# Patient Record
Sex: Female | Born: 1985
Health system: Southern US, Community
[De-identification: ages and names within clinical notes are randomized; demographics above are authoritative.]

## PROBLEM LIST (undated history)

## (undated) DIAGNOSIS — N390 Urinary tract infection, site not specified: Secondary | ICD-10-CM

## (undated) DIAGNOSIS — E079 Disorder of thyroid, unspecified: Secondary | ICD-10-CM

## (undated) DIAGNOSIS — F32A Depression, unspecified: Secondary | ICD-10-CM

## (undated) DIAGNOSIS — A6000 Herpesviral infection of urogenital system, unspecified: Secondary | ICD-10-CM

## (undated) HISTORY — DX: Urinary tract infection, site not specified: N39.0

## (undated) HISTORY — DX: Herpesviral infection of urogenital system, unspecified: A60.00

---

## 2007-05-13 ENCOUNTER — Observation Stay: Payer: Self-pay

## 2007-05-17 ENCOUNTER — Inpatient Hospital Stay: Payer: Self-pay | Admitting: Obstetrics & Gynecology

## 2007-10-01 LAB — HM HIV SCREENING LAB: HM HIV Screening: NEGATIVE

## 2008-12-14 ENCOUNTER — Emergency Department: Payer: Self-pay | Admitting: Internal Medicine

## 2012-05-15 LAB — HM PAP SMEAR: HM PAP: NORMAL

## 2012-06-24 ENCOUNTER — Emergency Department: Payer: Self-pay | Admitting: Emergency Medicine

## 2012-06-24 LAB — URINALYSIS, COMPLETE
Bilirubin,UR: NEGATIVE
Glucose,UR: NEGATIVE mg/dL (ref 0–75)
Nitrite: NEGATIVE
Ph: 6 (ref 4.5–8.0)
Protein: NEGATIVE
RBC,UR: 2 /HPF (ref 0–5)
Squamous Epithelial: 9
WBC UR: 10 /HPF (ref 0–5)

## 2012-06-24 LAB — COMPREHENSIVE METABOLIC PANEL
Albumin: 3.3 g/dL — ABNORMAL LOW (ref 3.4–5.0)
Anion Gap: 7 (ref 7–16)
Chloride: 105 mmol/L (ref 98–107)
Co2: 24 mmol/L (ref 21–32)
EGFR (Non-African Amer.): >60
Potassium: 3.5 mmol/L (ref 3.5–5.1)
SGOT(AST): 17 U/L (ref 15–37)
SGPT (ALT): 23 U/L (ref 12–78)
Sodium: 136 mmol/L (ref 136–145)
Total Protein: 7.1 g/dL (ref 6.4–8.2)

## 2012-06-24 LAB — CBC
MCH: 31.4 pg (ref 26.0–34.0)
MCV: 89 fL (ref 80–100)
RBC: 3.98 10*6/uL (ref 3.80–5.20)
WBC: 8.9 10*3/uL (ref 3.6–11.0)

## 2012-06-24 LAB — HCG, QUANTITATIVE, PREGNANCY: Beta Hcg, Quant.: 50304 m[IU]/mL — ABNORMAL HIGH

## 2012-09-15 ENCOUNTER — Emergency Department: Payer: Self-pay | Admitting: Emergency Medicine

## 2012-09-15 LAB — CBC
HCT: 33 % — ABNORMAL LOW (ref 35.0–47.0)
HGB: 11.7 g/dL — ABNORMAL LOW (ref 12.0–16.0)
MCH: 33 pg (ref 26.0–34.0)
MCV: 93 fL (ref 80–100)
Platelet: 226 10*3/uL (ref 150–440)
RBC: 3.54 10*6/uL — ABNORMAL LOW (ref 3.80–5.20)
RDW: 13.7 % (ref 11.5–14.5)

## 2012-09-15 LAB — COMPREHENSIVE METABOLIC PANEL
Albumin: 2.8 g/dL — ABNORMAL LOW (ref 3.4–5.0)
Anion Gap: 4 — ABNORMAL LOW (ref 7–16)
Bilirubin,Total: 0.2 mg/dL (ref 0.2–1.0)
Calcium, Total: 8.5 mg/dL (ref 8.5–10.1)
Creatinine: 0.4 mg/dL — ABNORMAL LOW (ref 0.60–1.30)
EGFR (Non-African Amer.): >60
Glucose: 92 mg/dL (ref 65–99)
SGOT(AST): 16 U/L (ref 15–37)
SGPT (ALT): 10 U/L — ABNORMAL LOW (ref 12–78)
Sodium: 138 mmol/L (ref 136–145)

## 2012-09-15 IMAGING — CT CT CHEST W/ CM
1 series · 15 of 31 positions shown, 19 images · IV contrast (APPLIED)
Comparison: none

REASON FOR EXAM: chest pain, sob, tachycardia, elevated d dimer. pt is
pregnant, discussed risks
COMMENTS:

[Series 4: soft tissue · axial · 0.62mm/px · z∈[-600,-435]mm · 15 of 61 slices shown, 19 images]
[im 3/61  mediastinal]
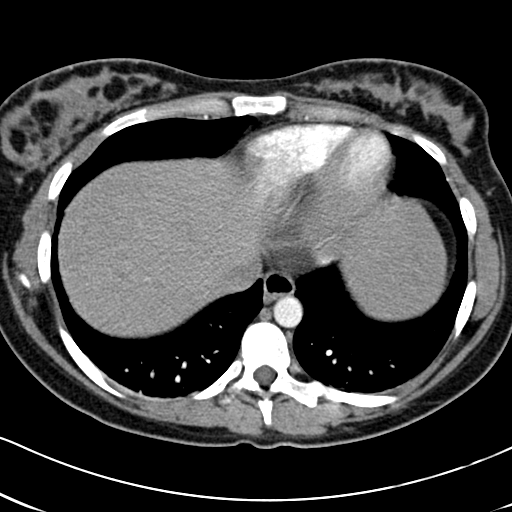
[im 3/61  lung]
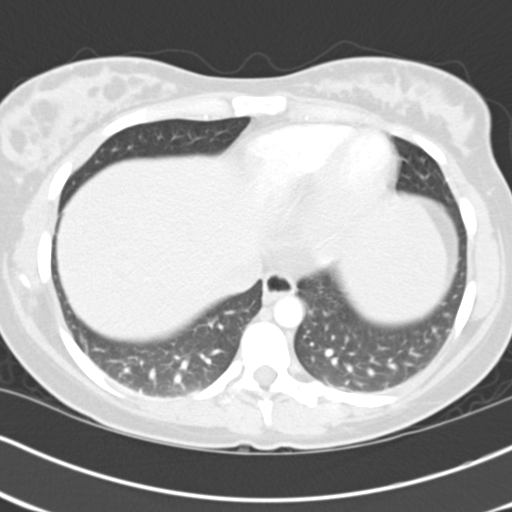
[im 7/61  lung]
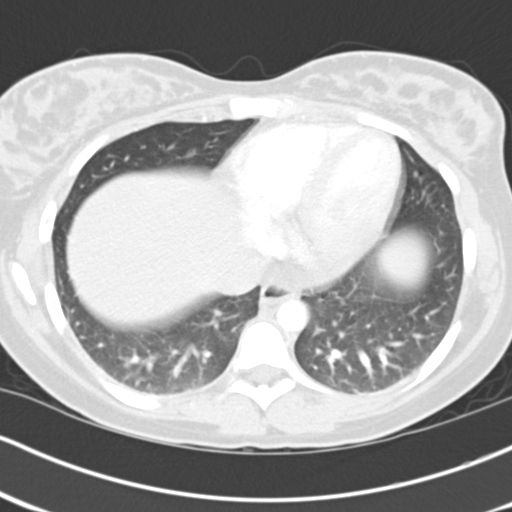
[im 12/61  lung]
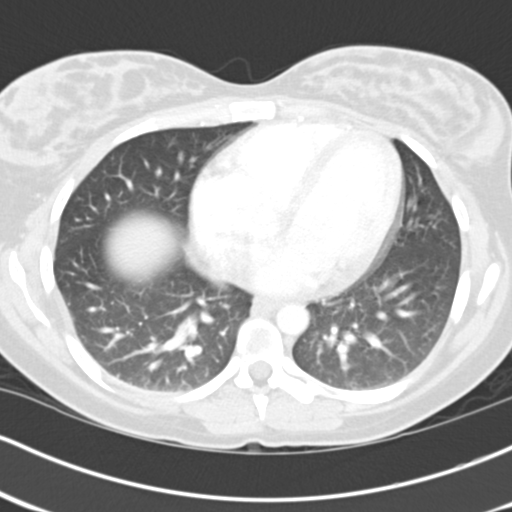
[im 14/61  lung]
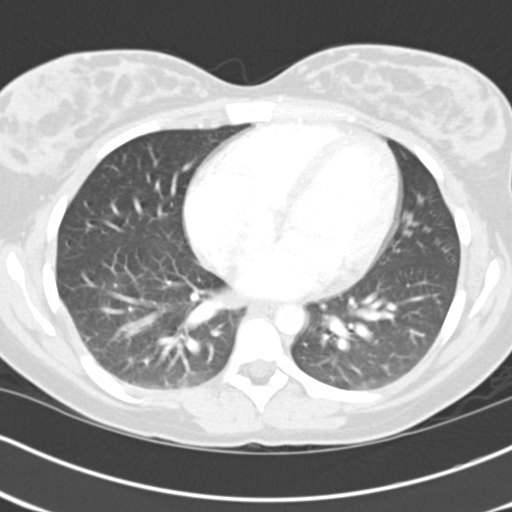
[im 18/61  mediastinal]
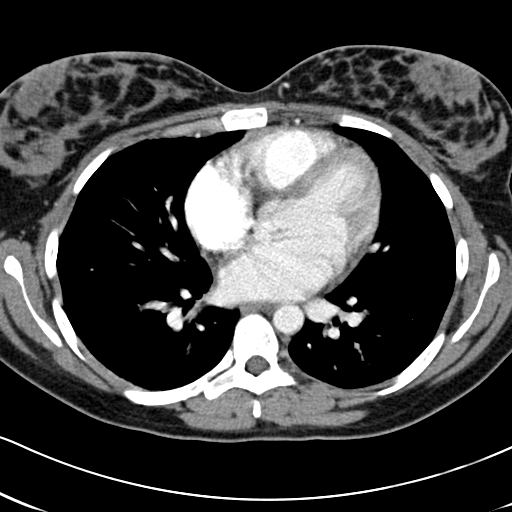
[im 18/61  lung]
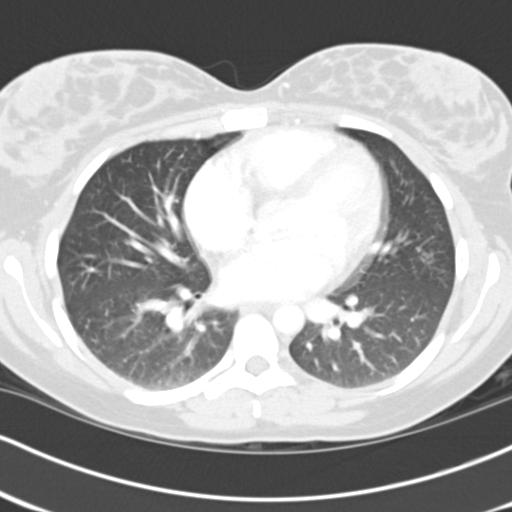
[im 23/61  lung]
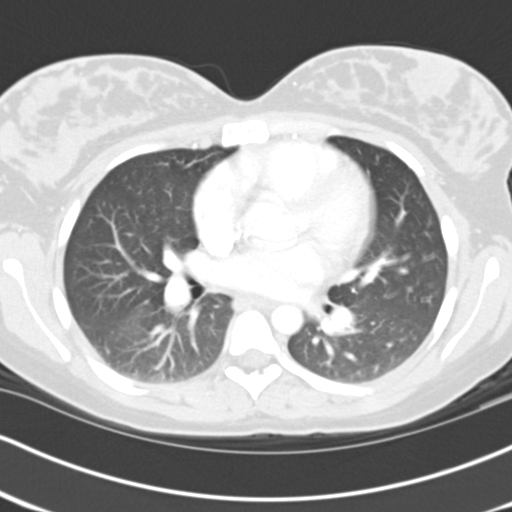
[im 27/61  lung]
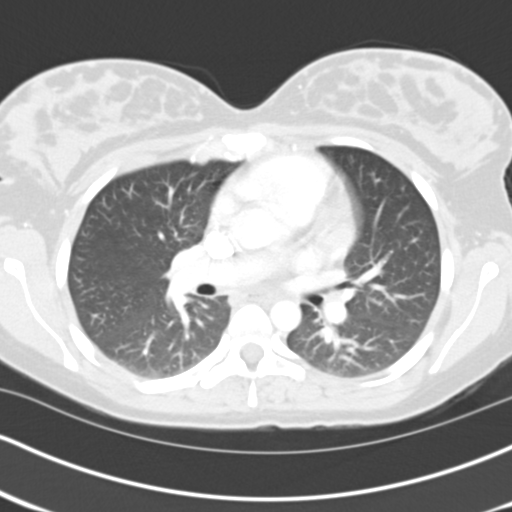
[im 29/61  lung]
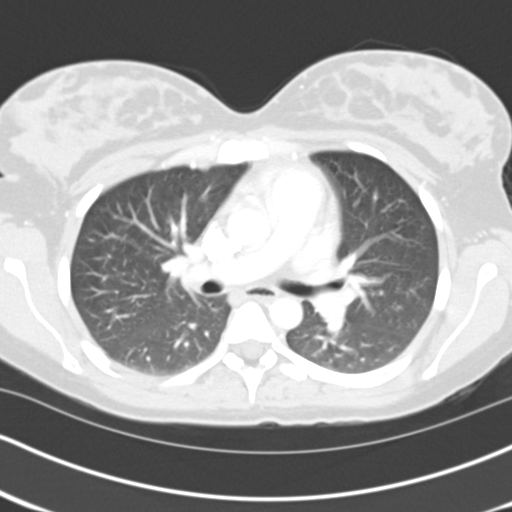
[im 34/61  mediastinal]
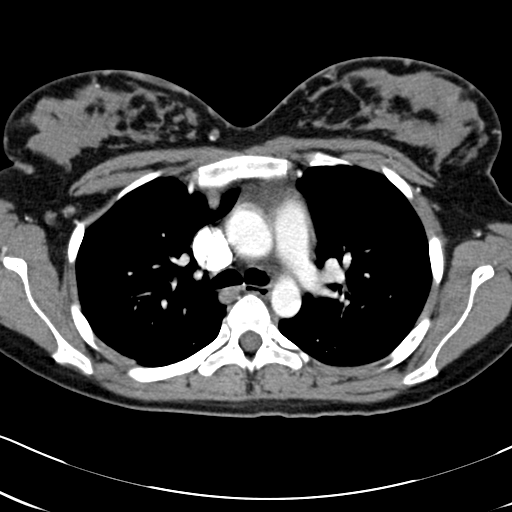
[im 34/61  lung]
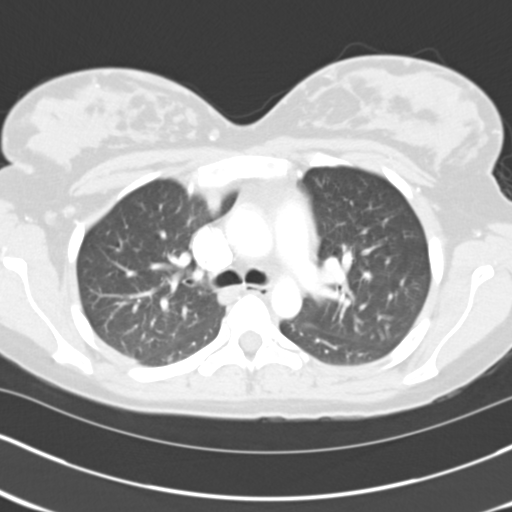
[im 37/61  lung]
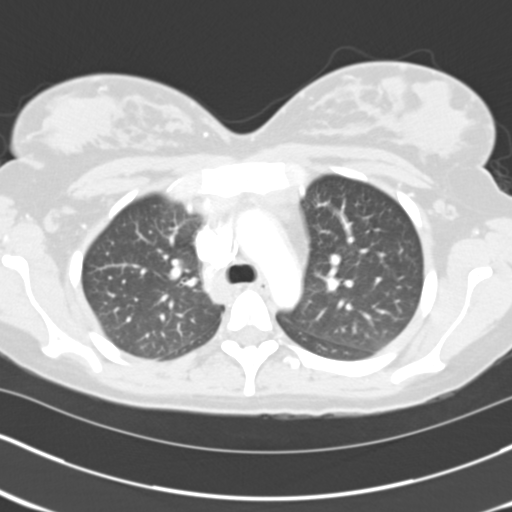
[im 41/61  lung]
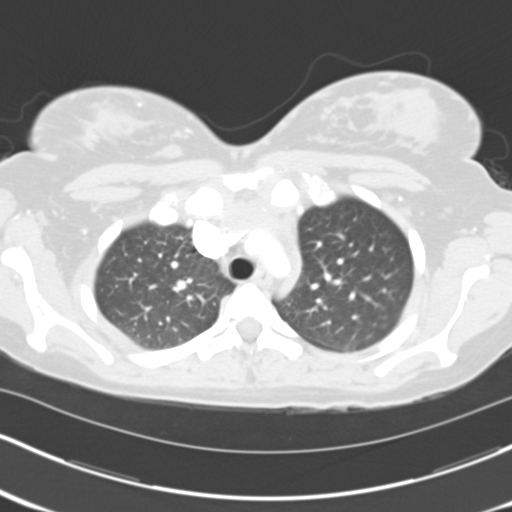
[im 45/61  lung]
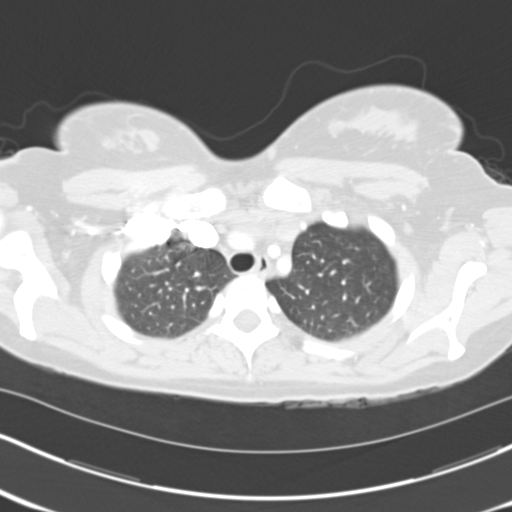
[im 49/61  mediastinal]
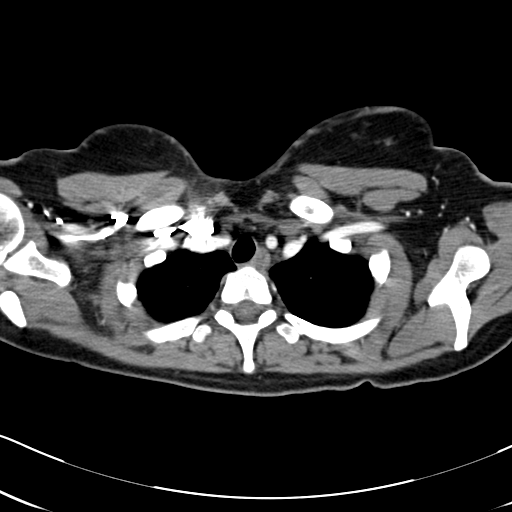
[im 49/61  lung]
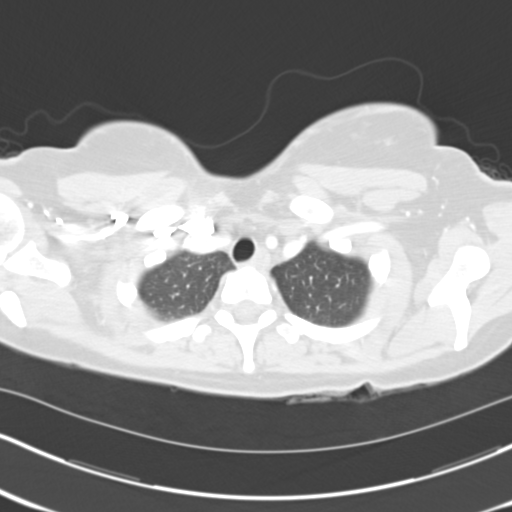
[im 54/61  lung]
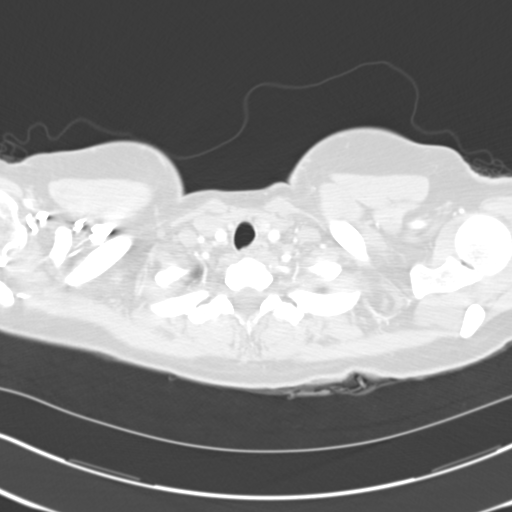
[im 58/61  lung]
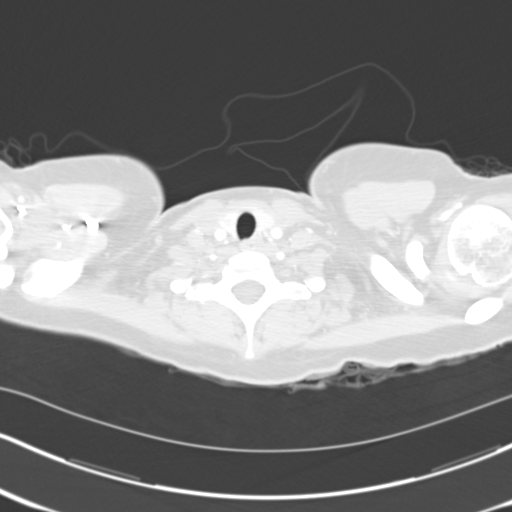

[15 of 31 positions shown; findings below may reference images not displayed]

PROCEDURE:     CT  - CT CHEST (FOR PE) W  - [DATE]  [DATE]

RESULT:     Emergent CT of the chest is performed with 100 mL of [4L]
iodinated intravenous contrast with images reconstructed in the axial plane
at 3 mm slice thickness. The patient has no previous exam for comparison.

There is a low-attenuation area in the mid right thyroid lobe measuring
cm with a Hounsfield reading of 54 which could represent a nonenhancing
nodule. The thyroid lobes otherwise appear unremarkable. The thoracic aorta
is normal in caliber without evidence of dissection. The pulmonary arteries
are well-opacified. Without evidence of a filling defect to suggest
pulmonary embolism. There is no focal consolidation in the included portions
of the lungs. There may be some minimal atelectasis in the right lower lobe.
There is some minimal motion artifact present. The included portions of the
liver and spleen appear unremarkable. There is no pleural or pericardial
effusion. No adenopathy is evident. The bony structures appear unremarkable.
IMPRESSION: 1. No pulmonary embolism. No thoracic aortic aneurysm or thoracic aortic
dissection.
2. Minimal area of nonenhancement in the right thyroid lobe which is
nonspecific. This could represent a nonenhancing solid or cystic lesion.
This measures approximately 6 mm.
3. No definite airspace disease, effusion, edema or pneumothorax.

[REDACTED]

## 2012-10-14 ENCOUNTER — Observation Stay: Payer: Self-pay | Admitting: Obstetrics and Gynecology

## 2012-12-09 ENCOUNTER — Observation Stay: Payer: Self-pay | Admitting: Obstetrics and Gynecology

## 2012-12-19 ENCOUNTER — Inpatient Hospital Stay: Payer: Self-pay | Admitting: Obstetrics and Gynecology

## 2012-12-19 LAB — CBC WITH DIFFERENTIAL/PLATELET
Basophil %: 0.4 %
Eosinophil #: 0.1 10*3/uL (ref 0.0–0.7)
Eosinophil %: 0.7 %
HCT: 37.7 % (ref 35.0–47.0)
MCH: 32.7 pg (ref 26.0–34.0)
MCHC: 34.8 g/dL (ref 32.0–36.0)
MCV: 94 fL (ref 80–100)
Monocyte %: 7.1 %
Neutrophil #: 9.6 10*3/uL — ABNORMAL HIGH (ref 1.4–6.5)
Neutrophil %: 69.7 %
RBC: 4.02 10*6/uL (ref 3.80–5.20)
WBC: 13.8 10*3/uL — ABNORMAL HIGH (ref 3.6–11.0)

## 2012-12-19 LAB — GC/CHLAMYDIA PROBE AMP

## 2013-09-30 ENCOUNTER — Emergency Department: Payer: Self-pay | Admitting: Emergency Medicine

## 2013-09-30 LAB — MONONUCLEOSIS SCREEN: Mono Test: NEGATIVE

## 2013-09-30 LAB — CBC WITH DIFFERENTIAL/PLATELET
Basophil #: 0 10*3/uL (ref 0.0–0.1)
Basophil %: 0.5 %
EOS PCT: 0.1 %
Eosinophil #: 0 10*3/uL (ref 0.0–0.7)
HCT: 40.5 % (ref 35.0–47.0)
HGB: 13.3 g/dL (ref 12.0–16.0)
LYMPHS PCT: 13 %
Lymphocyte #: 1.3 10*3/uL (ref 1.0–3.6)
MCH: 30.2 pg (ref 26.0–34.0)
MCHC: 32.7 g/dL (ref 32.0–36.0)
MCV: 92 fL (ref 80–100)
MONOS PCT: 12.8 %
Monocyte #: 1.3 x10 3/mm — ABNORMAL HIGH (ref 0.2–0.9)
NEUTROS PCT: 73.6 %
Neutrophil #: 7.5 10*3/uL — ABNORMAL HIGH (ref 1.4–6.5)
PLATELETS: 206 10*3/uL (ref 150–440)
RBC: 4.39 10*6/uL (ref 3.80–5.20)
RDW: 12.9 % (ref 11.5–14.5)
WBC: 10.2 10*3/uL (ref 3.6–11.0)

## 2013-10-02 LAB — BETA STREP CULTURE(ARMC)

## 2014-06-08 NOTE — H&P (Signed)
L&D Evaluation:  History:  HPI Pt is a 29 yo G2P1001 at 31.[redacted] weeks GA who presents to L&D today after twisting her ankle and falling. Pt does not remember if she fell on her abdomen. She is A+, VI, RI, and had her TDap on 10/07/12. Pt reports +FM, denies bleeding, LOF, contractions, or abdominal pain.   Presents with other, right ankle pain   Patient's Medical History No Chronic Illness   Patient's Surgical History none   Medications Pre Natal Vitamins   Allergies NKDA   Social History none   Family History Non-Contributory   ROS:  General normal   HEENT normal   CNS normal   GI normal   GU normal   Resp normal   CV normal   Renal normal   MS right ankle pain uopn movement   Exam:  Vital Signs stable   General no apparent distress   Mental Status clear   Chest clear   Heart normal sinus rhythm   Abdomen gravid, non-tender   Edema trace in the right foot   Skin dry, no lesions   Impression:  Impression other, possible sprained ankle   Plan:  Plan EFM/NST, monitor FHT, medication ordered for pain   Comments Ice packs applied to ankle Safe to have xray if condition worsens   Electronic Signatures: Elvin Banker, AnimatorCourtney (CNM)  (Signed 16-Sep-14 10:38)  Authored: L&D Evaluation   Last Updated: 16-Sep-14 10:38 by Jannet MantisSubudhi, Leightyn Cina (CNM)

## 2014-08-05 IMAGING — US US SOFT TISSUE HEAD/NECK
1 series · 14 of 25 positions shown · non-contrast
Comparison: None.

CLINICAL DATA: Goiter, right-sided enlargement on physical exam

EXAM:
THYROID ULTRASOUND
TECHNIQUE: Ultrasound examination of the thyroid gland and adjacent soft
tissues was performed.

[Series 1: us soft tissue head/neck · 0.07mm/px · 14 of 98 slices shown]
[im 1/98]
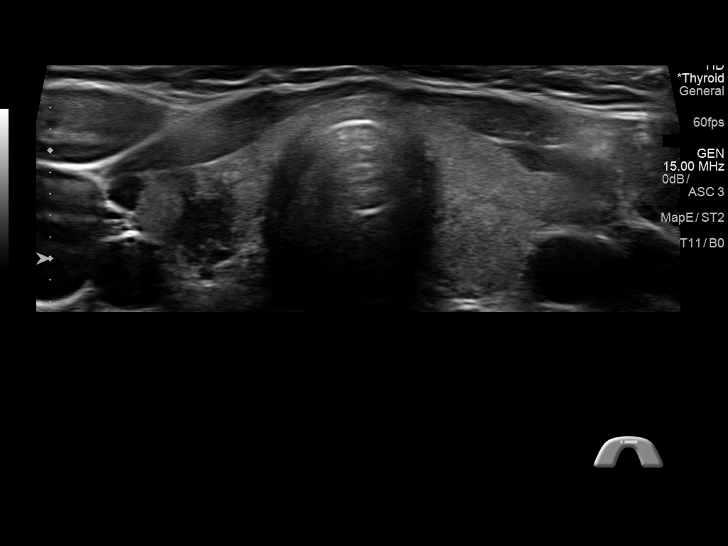
[im 9/98]
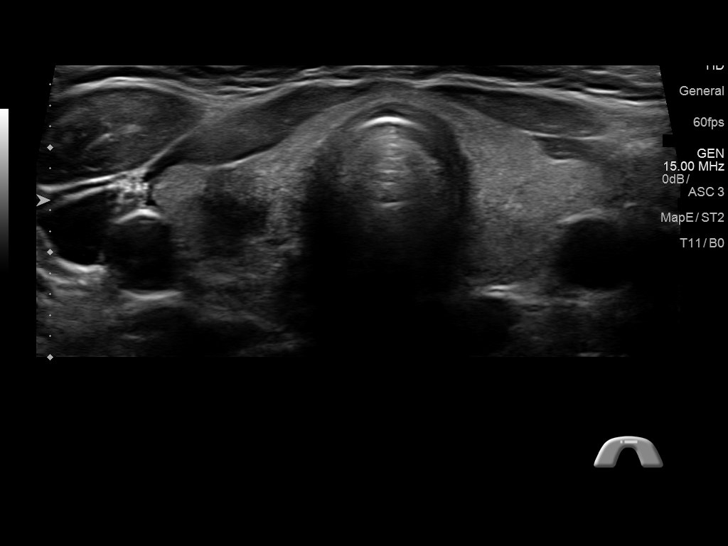
[im 17/98]
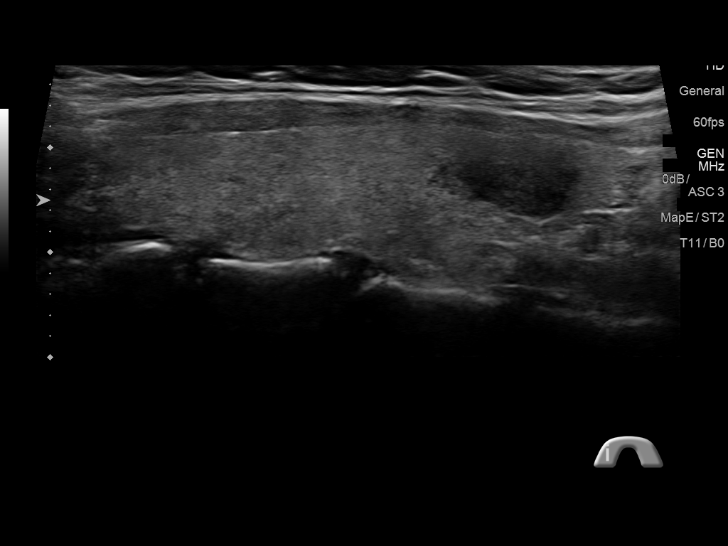
[im 25/98]
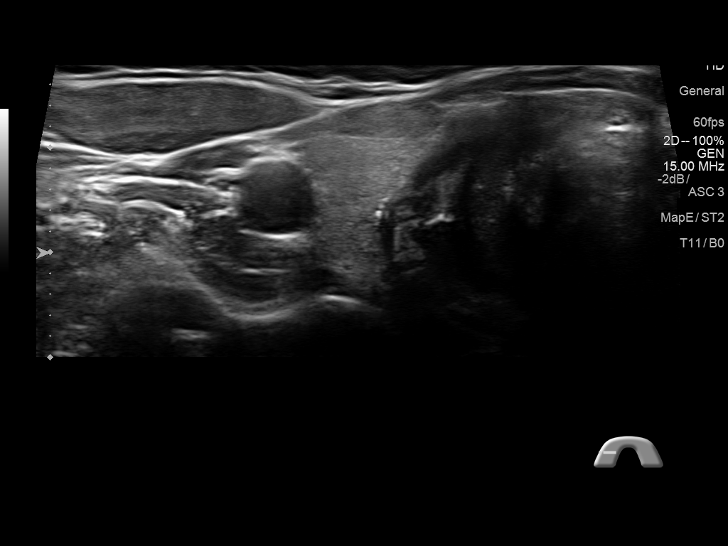
[im 33/98]
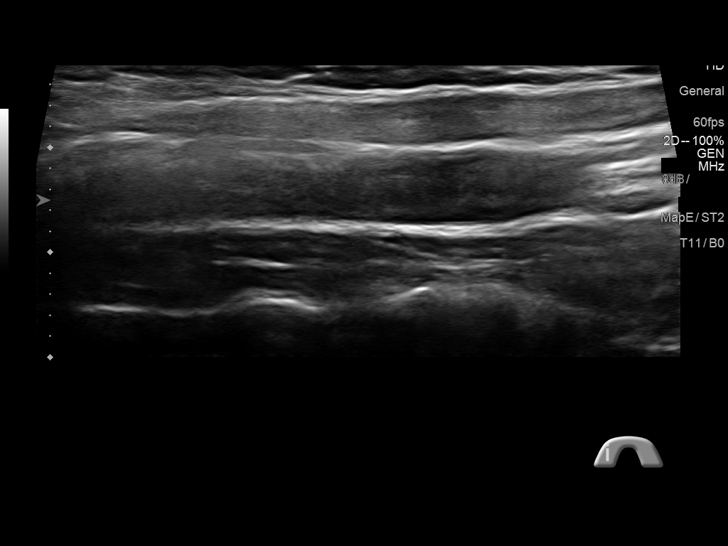
[im 37/98]
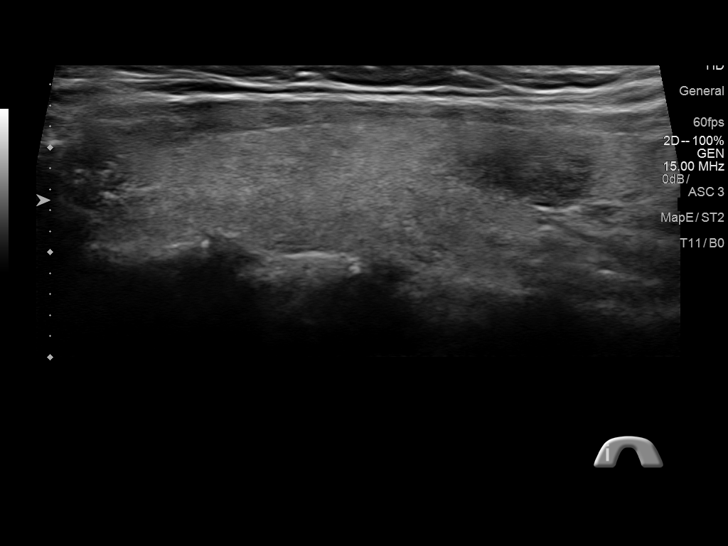
[im 45/98]
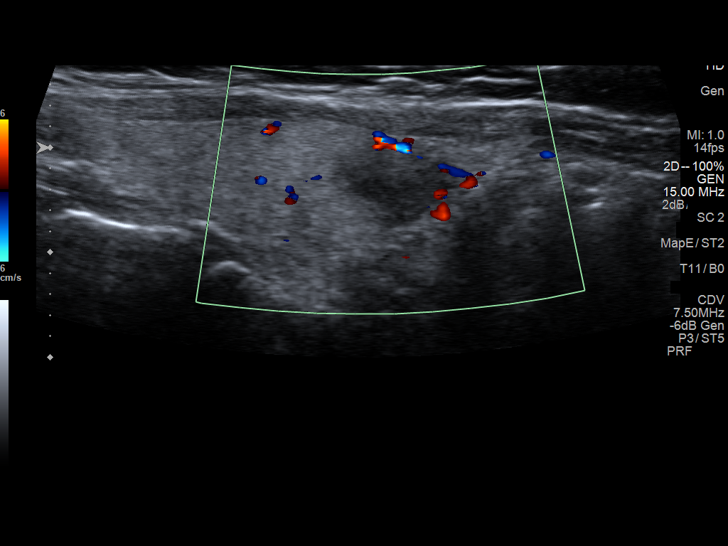
[im 53/98]
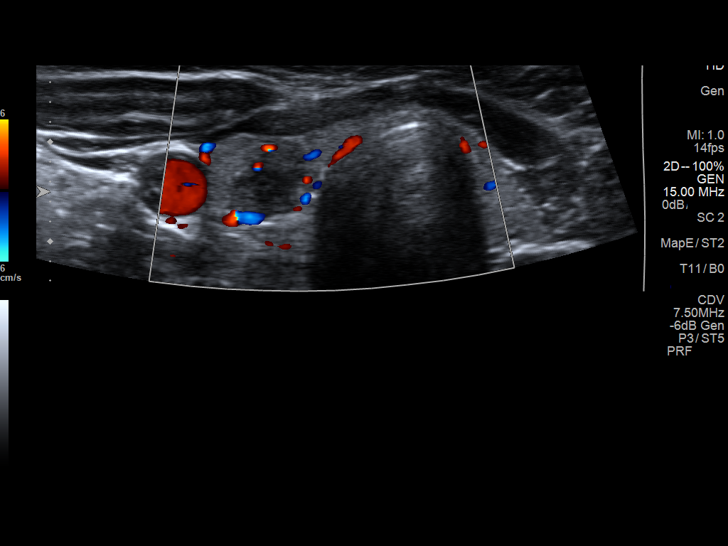
[im 61/98]
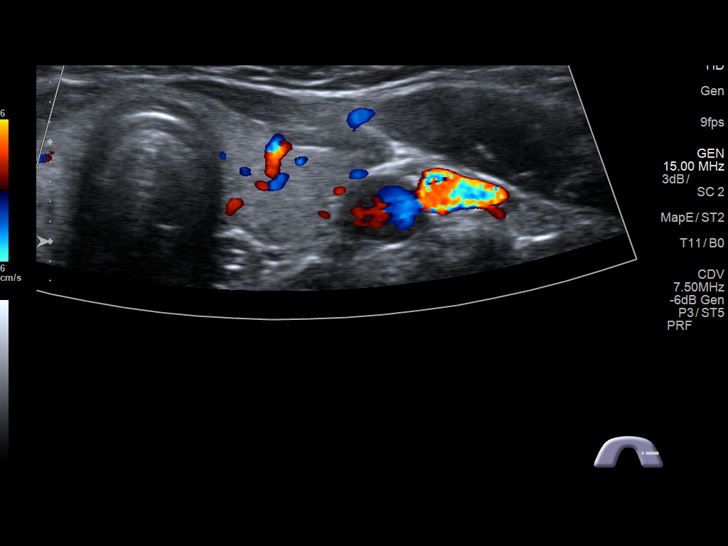
[im 65/98]
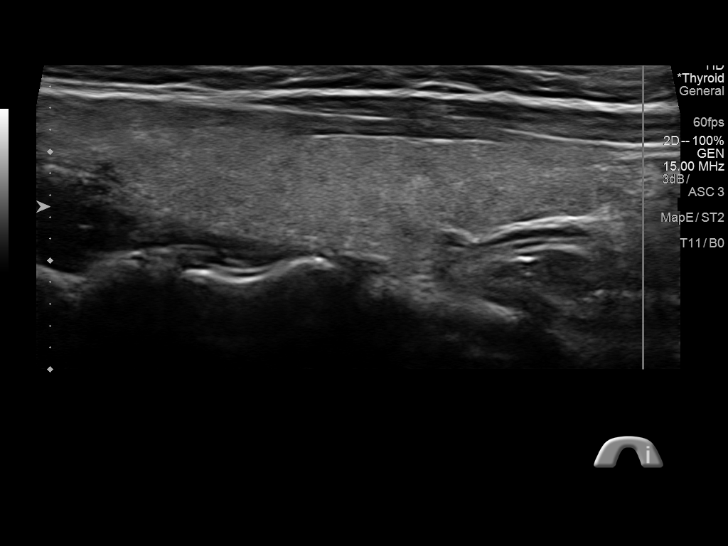
[im 73/98]
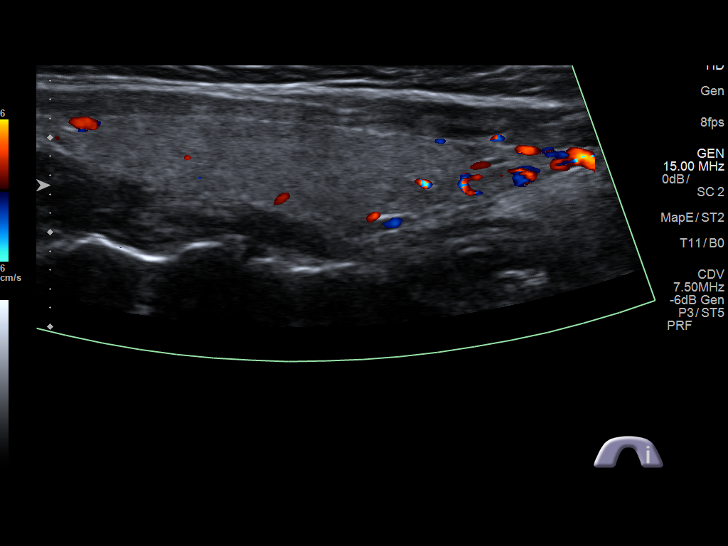
[im 81/98]
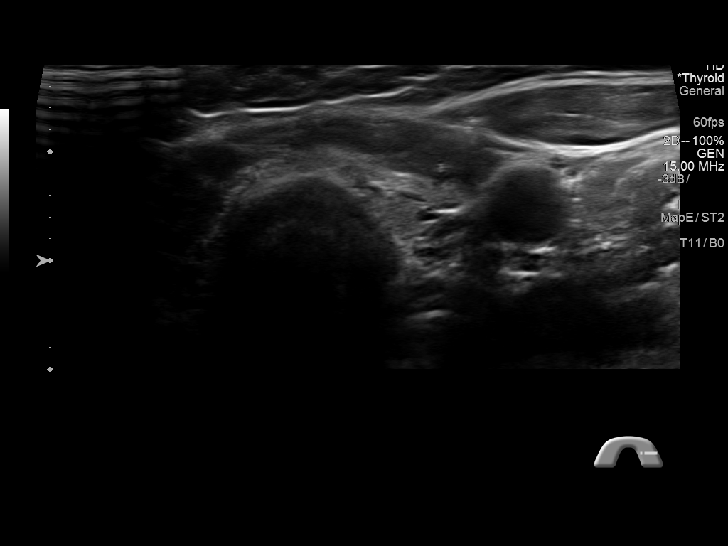
[im 89/98]
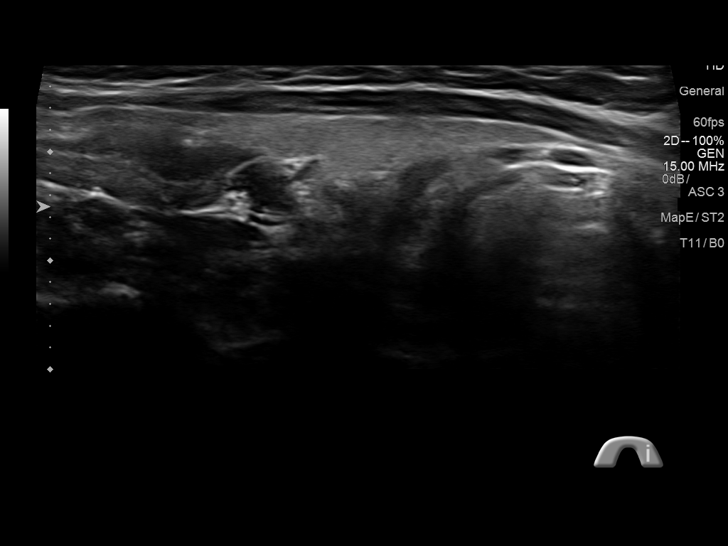
[im 98/98]
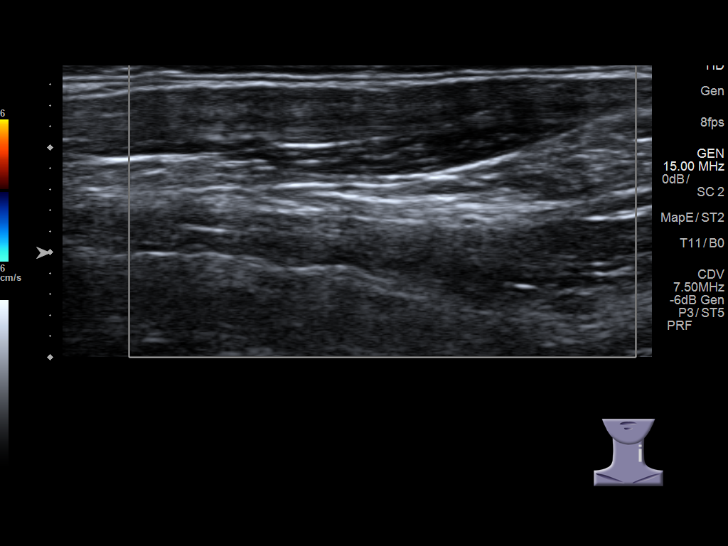

[14 of 25 positions shown; findings below may reference images not displayed]

FINDINGS: Right thyroid lobe

Measurements: 54 x 15 x 15 mm. Homogeneous background echotexture. 9
x 10 x 7 mm complex cyst, inferior pole.

Left thyroid lobe

Measurements: 47 x 12 x 12 mm.  No nodules visualized.

Isthmus

Thickness: 3 mm.  No nodules visualized.

Lymphadenopathy

None visualized.
IMPRESSION: 1. Mild thyromegaly with a solitary 10 mm right complex cyst.
Findings do not meet current consensus criteria for biopsy.
Follow-up by clinical exam is recommended. If patient has known risk
factors for thyroid carcinoma, consider follow-up ultrasound in 12
months. If patient is clinically hyperthyroid, consider nuclear
medicine thyroid uptake and scan. This recommendation follows the
consensus statement: Management of Thyroid Nodules Detected as US:
Society of Radiologists in Ultrasound Consensus Conference

## 2014-09-02 ENCOUNTER — Emergency Department
Admission: EM | Admit: 2014-09-02 | Discharge: 2014-09-02 | Disposition: A | Discharge status: Home / Self Care | Payer: Self-pay | Attending: Emergency Medicine | Admitting: Emergency Medicine

## 2014-09-02 ENCOUNTER — Encounter: Payer: Self-pay | Admitting: Emergency Medicine

## 2014-09-02 ENCOUNTER — Emergency Department: Payer: Self-pay

## 2014-09-02 DIAGNOSIS — J069 Acute upper respiratory infection, unspecified: Secondary | ICD-10-CM | POA: Insufficient documentation

## 2014-09-02 LAB — CBC WITH DIFFERENTIAL/PLATELET
Basophils Absolute: 0 10*3/uL (ref 0–0.1)
Basophils Relative: 1 %
EOS PCT: 4 %
Eosinophils Absolute: 0.2 10*3/uL (ref 0–0.7)
HCT: 38 % (ref 35.0–47.0)
HEMOGLOBIN: 12.7 g/dL (ref 12.0–16.0)
LYMPHS ABS: 2.2 10*3/uL (ref 1.0–3.6)
Lymphocytes Relative: 34 %
MCH: 30.4 pg (ref 26.0–34.0)
MCHC: 33.5 g/dL (ref 32.0–36.0)
MCV: 90.6 fL (ref 80.0–100.0)
Monocytes Absolute: 0.5 10*3/uL (ref 0.2–0.9)
Monocytes Relative: 7 %
NEUTROS PCT: 54 %
Neutro Abs: 3.5 10*3/uL (ref 1.4–6.5)
PLATELETS: 251 10*3/uL (ref 150–440)
RBC: 4.19 MIL/uL (ref 3.80–5.20)
RDW: 13.1 % (ref 11.5–14.5)
WBC: 6.4 10*3/uL (ref 3.6–11.0)

## 2014-09-02 LAB — TSH: TSH: 1.018 u[IU]/mL (ref 0.350–4.500)

## 2014-09-02 LAB — T4, FREE: Free T4: 0.65 ng/dL (ref 0.61–1.12)

## 2014-09-02 LAB — MONONUCLEOSIS SCREEN: Mono Screen: NEGATIVE

## 2014-09-02 IMAGING — CR DG CHEST 2V
1 series · 2 of 2 positions shown · non-contrast
Comparison: CT of the chest dated [DATE]

CLINICAL DATA: Productive cough.  Right neck swelling.

EXAM:
CHEST  2 VIEW

[Series 1: dg chest 2 view · 0.14mm/px · 2 of 2 slices shown]
[im 1/2]
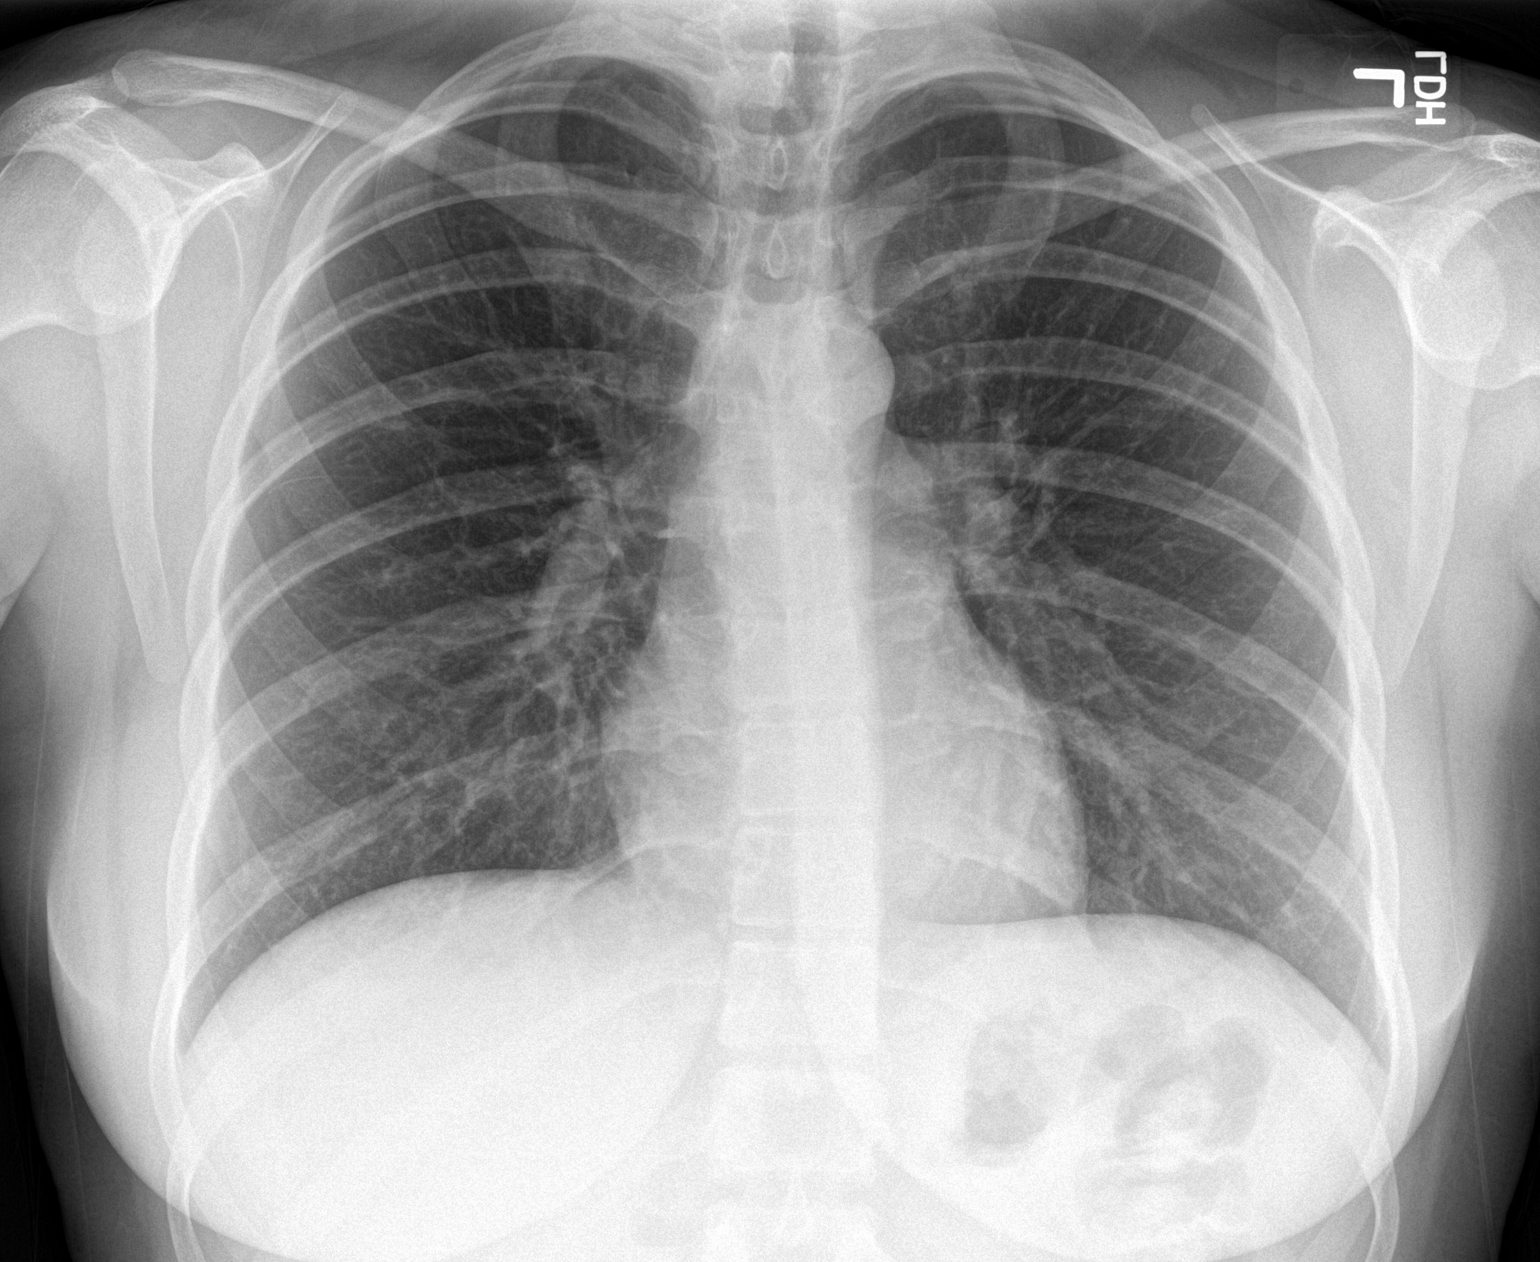
[im 2/2]
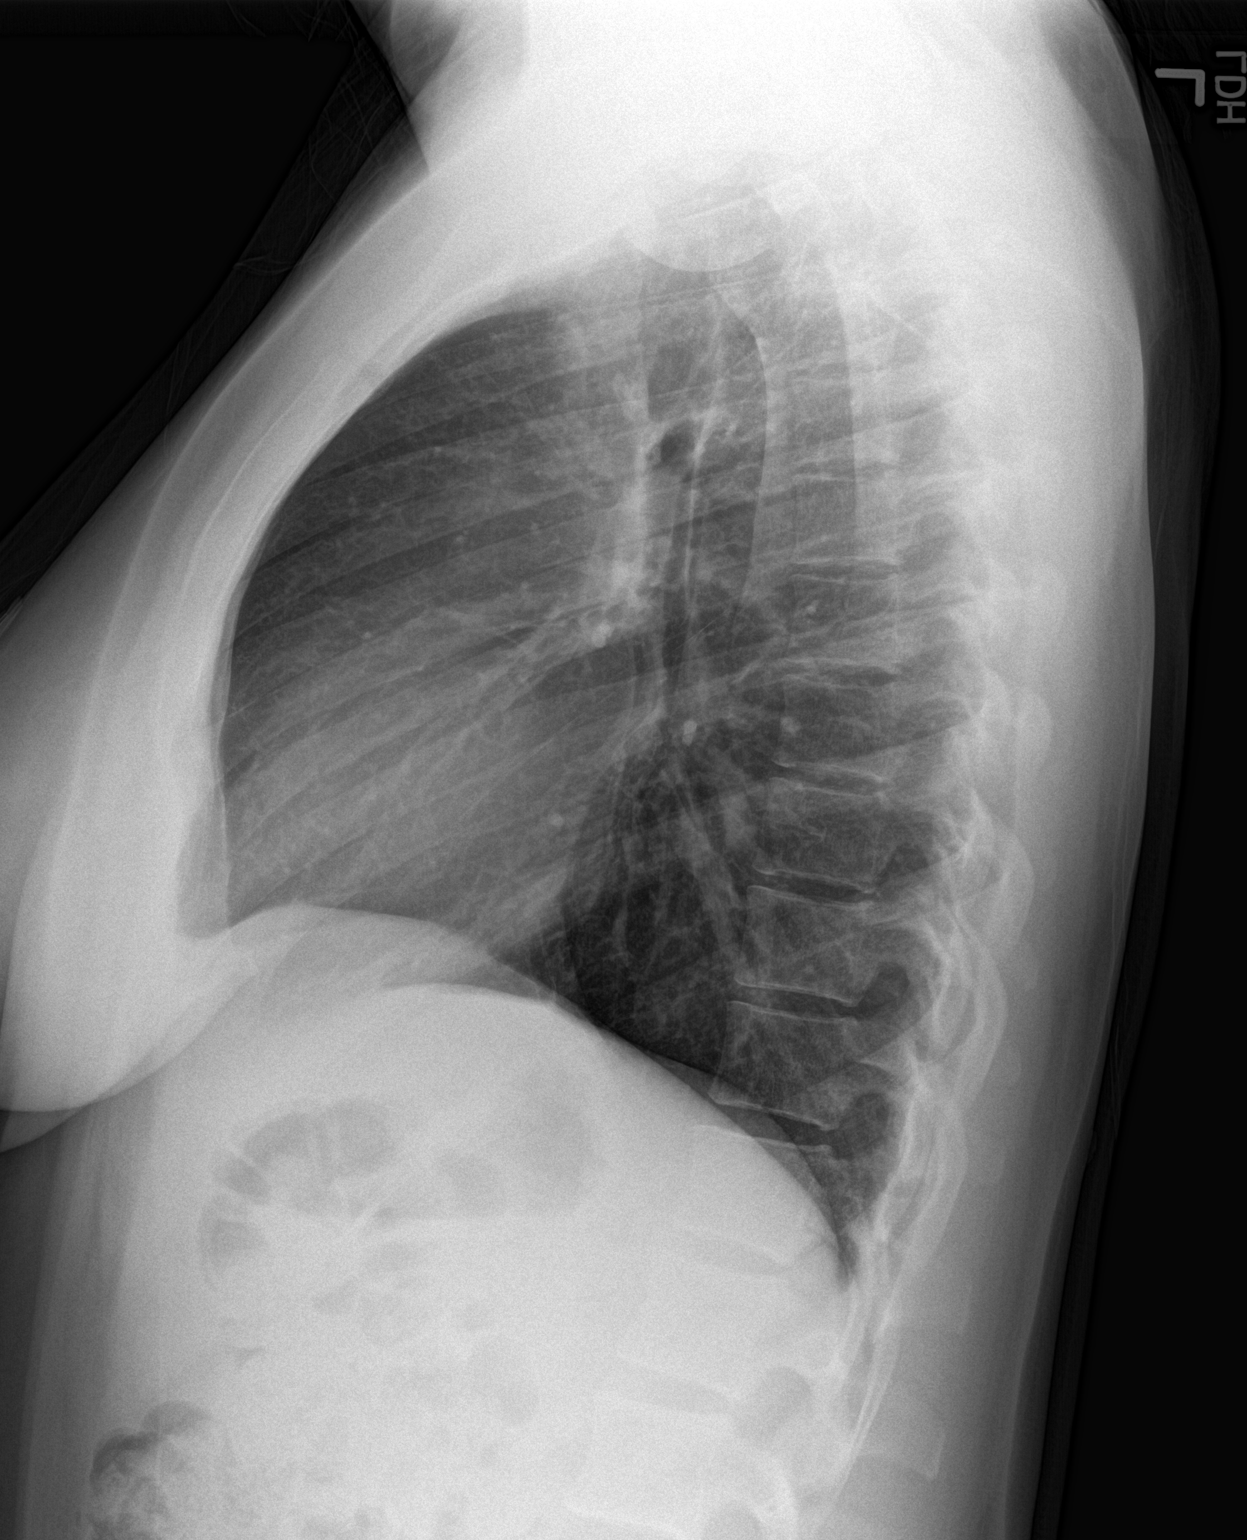

[2 of 2 positions shown; findings below may reference images not displayed]

FINDINGS: The heart size and mediastinal contours are within normal limits.
Both lungs are clear. The visualized skeletal structures are
unremarkable.
IMPRESSION: No active cardiopulmonary disease.

## 2014-09-02 MED ORDER — CEPHALEXIN 500 MG PO CAPS: 500.0000 mg | ORAL_CAPSULE | Freq: Four times a day (QID) | ORAL | Status: AC

## 2014-09-02 NOTE — ED Notes (Signed)
Patient presents to the ED with swollen area to the right side of her neck.  Patient states she noticed area on Monday.  Patient states swelling makes swallowing somewhat difficulty.  Patient reports cough x 3 weeks.  Patient states she was seen at an Urgent Care Tuesday and was told that swelling was due to a thyroid issue.  Patient states he was referred to an endocrinologist and patient states she called [facility] endocrinology and patient was told they did not receive a referral and would call patient when they received it from Urgent Care.  Patient has been taking azithromycin and cough syrup without improvement.

## 2014-09-02 NOTE — Discharge Instructions (Signed)
Cough, Adult ° A cough is a reflex. It helps you clear your throat and airways. A cough can help heal your body. A cough can last 2 or 3 weeks (acute) or may last more than 8 weeks (chronic). Some common causes of a cough can include an infection, allergy, or a cold. °HOME CARE °· Only take medicine as told by your doctor. °· If given, take your medicines (antibiotics) as told. Finish them even if you start to feel better. °· Use a cold steam vaporizer or humidifier in your home. This can help loosen thick spit (secretions). °· Sleep so you are almost sitting up (semi-upright). Use pillows to do this. This helps reduce coughing. °· Rest as needed. °· Stop smoking if you smoke. °GET HELP RIGHT AWAY IF: °· You have yellowish-white fluid (pus) in your thick spit. °· Your cough gets worse. °· Your medicine does not reduce coughing, and you are losing sleep. °· You cough up blood. °· You have trouble breathing. °· Your pain gets worse and medicine does not help. °· You have a fever. °MAKE SURE YOU:  °· Understand these instructions. °· Will watch your condition. °· Will get help right away if you are not doing well or get worse. °Document Released: 09/28/2010 Document Revised: 06/01/2013 Document Reviewed: 09/28/2010 °ExitCare® Patient Information ©2015 [employer]. This information is not intended to replace advice given to you by your health care provider. Make sure you discuss any questions you have with your health care provider. ° °Upper Respiratory Infection, Adult °An upper respiratory infection (URI) is also sometimes known as the common cold. The upper respiratory tract includes the nose, sinuses, throat, trachea, and bronchi. Bronchi are the airways leading to the lungs. Most people improve within 1 week, but symptoms can last up to 2 weeks. A residual cough may last even longer.  °CAUSES °Many different viruses can infect the tissues lining the upper respiratory tract. The tissues become irritated and  inflamed and often become very moist. Mucus production is also common. A cold is contagious. You can easily spread the virus to others by oral contact. This includes kissing, sharing a glass, coughing, or sneezing. Touching your mouth or nose and then touching a surface, which is then touched by another person, can also spread the virus. °SYMPTOMS  °Symptoms typically develop 1 to 3 days after you come in contact with a cold virus. Symptoms vary from person to person. They may include: °· Runny nose. °· Sneezing. °· Nasal congestion. °· Sinus irritation. °· Sore throat. °· Loss of voice (laryngitis). °· Cough. °· Fatigue. °· Muscle aches. °· Loss of appetite. °· Headache. °· Low-grade fever. °DIAGNOSIS  °You might diagnose your own cold based on familiar symptoms, since most people get a cold 2 to 3 times a year. Your caregiver can confirm this based on your exam. Most importantly, your caregiver can check that your symptoms are not due to another disease such as strep throat, sinusitis, pneumonia, asthma, or epiglottitis. Blood tests, throat tests, and X-rays are not necessary to diagnose a common cold, but they may sometimes be helpful in excluding other more serious diseases. Your caregiver will decide if any further tests are required. °RISKS AND COMPLICATIONS  °You may be at risk for a more severe case of the common cold if you smoke cigarettes, have chronic heart disease (such as heart failure) or lung disease (such as asthma), or if you have a weakened immune system. The very young and very old are   also at risk for more serious infections. Bacterial sinusitis, middle ear infections, and bacterial pneumonia can complicate the common cold. The common cold can worsen asthma and chronic obstructive pulmonary disease (COPD). Sometimes, these complications can require emergency medical care and may be life-threatening. °PREVENTION  °The best way to protect against getting a cold is to practice good hygiene. Avoid  oral or hand contact with people with cold symptoms. Wash your hands often if contact occurs. There is no clear evidence that vitamin C, vitamin E, echinacea, or exercise reduces the chance of developing a cold. However, it is always recommended to get plenty of rest and practice good nutrition. °TREATMENT  °Treatment is directed at relieving symptoms. There is no cure. Antibiotics are not effective, because the infection is caused by a virus, not by bacteria. Treatment may include: °· Increased fluid intake. Sports drinks offer valuable electrolytes, sugars, and fluids. °· Breathing heated mist or steam (vaporizer or shower). °· Eating chicken soup or other clear broths, and maintaining good nutrition. °· Getting plenty of rest. °· Using gargles or lozenges for comfort. °· Controlling fevers with ibuprofen or acetaminophen as directed by your caregiver. °· Increasing usage of your inhaler if you have asthma. °Zinc gel and zinc lozenges, taken in the first 24 hours of the common cold, can shorten the duration and lessen the severity of symptoms. Pain medicines may help with fever, muscle aches, and throat pain. A variety of non-prescription medicines are available to treat congestion and runny nose. Your caregiver can make recommendations and may suggest nasal or lung inhalers for other symptoms.  °HOME CARE INSTRUCTIONS  °· Only take over-the-counter or prescription medicines for pain, discomfort, or fever as directed by your caregiver. °· Use a warm mist humidifier or inhale steam from a shower to increase air moisture. This may keep secretions moist and make it easier to breathe. °· Drink enough water and fluids to keep your urine clear or pale yellow. °· Rest as needed. °· Return to work when your temperature has returned to normal or as your caregiver advises. You may need to stay home longer to avoid infecting others. You can also use a face mask and careful hand washing to prevent spread of the virus. °SEEK  MEDICAL CARE IF:  °· After the first few days, you feel you are getting worse rather than better. °· You need your caregiver's advice about medicines to control symptoms. °· You develop chills, worsening shortness of breath, or brown or red sputum. These may be signs of pneumonia. °· You develop yellow or brown nasal discharge or pain in the face, especially when you bend forward. These may be signs of sinusitis. °· You develop a fever, swollen neck glands, pain with swallowing, or white areas in the back of your throat. These may be signs of strep throat. °SEEK IMMEDIATE MEDICAL CARE IF:  °· You have a fever. °· You develop severe or persistent headache, ear pain, sinus pain, or chest pain. °· You develop wheezing, a prolonged cough, cough up blood, or have a change in your usual mucus (if you have chronic lung disease). °· You develop sore muscles or a stiff neck. °Document Released: 07/11/2000 Document Revised: 04/09/2011 Document Reviewed: 04/22/2013 °ExitCare® Patient Information ©2015 [employer]. This information is not intended to replace advice given to you by your health care provider. Make sure you discuss any questions you have with your health care provider. ° °

## 2014-09-02 NOTE — ED Provider Notes (Signed)
[facility] Emergency Department Provider Note  ____________________________________________  Time seen: 9:30  I have reviewed the triage vital signs and the nursing notes.   HISTORY  Chief Complaint Lymphadenopathy  History of present illness {Kari Frost is a 29 y.o. female patient's prodromal presenting with some recent lymphadenopathy. Patient was seen and evaluated in urgent care center. She states she was started on antibiotics which include Zithromax and was given some cough medication without a significant relief. Patient's cough is occasionally productive with yellow tinged with no blood. She states she was noted by the urgent care physician and the concerns for possible thyroid disease. Patient states that she tried to follow up with an endocrinologist without any success. She was concerned when she noted some swelling on the right side of her neck and her throat seemed to be more sore today. She states she has had some night sweats and some mild weight gain. She denies any actual fever or chills. She denies any chest or abdominal pain. She denies any nausea vomiting or diarrhea. She states she has been on the antibiotics now for 48 hours without much in way of successful relief of her symptoms.  { History reviewed. No pertinent past medical history.  There are no active problems to display for this patient.   History reviewed. No pertinent past surgical history.  Current Outpatient Rx  Name  Route  Sig  Dispense  Refill  . azithromycin (ZITHROMAX) 250 MG tablet      take 2 tablets by mouth on day 1 then 1 tablet on days 2 through 5      0   . brompheniramine-pseudoephedrine-DM 30-2-10 MG/5ML syrup   Oral   Take 10 mLs by mouth every 6 (six) hours.      0     Allergies Review of patient's allergies indicates no known allergies.  History reviewed. No pertinent family history.  Social History History  Substance Use Topics  . Smoking  status: Never Smoker   . Smokeless tobacco: Not on file  . Alcohol Use: No    Review of Systems  Constitutional: Negative for fever. Eyes: Negative for visual changes. No injected conjunctiva ENT: Positive for sore throat Cardiovascular: Negative for chest pain. Respiratory: Negative for shortness of breath. Gastrointestinal: Negative for abdominal pain, vomiting and diarrhea. Genitourinary: Negative for dysuria. Musculoskeletal: Negative for back pain. Skin: Negative for rash. Neurological: Negative for headaches or focal weakness Psychiatric: Patient is calm with no respiratory distress    ____________________________________________   PHYSICAL EXAM:  VITAL SIGNS: ED Triage Vitals  Enc Vitals Group     BP 09/02/14 0906 131/85 mmHg     Pulse Rate 09/02/14 0838 98     Resp 09/02/14 0838 20     Temp 09/02/14 0838 98.5 F (36.9 C)     Temp Source 09/02/14 0838 Oral     SpO2 09/02/14 0838 99 %     Weight 09/02/14 0838 160 lb (72.576 kg)     Height 09/02/14 0838  (1.651 m)     Head Cir --      Peak Flow --      Pain Score 09/02/14 0839 7     Pain Loc --      Pain Edu? --      Excl. in GC? --    Constitutional: Alert and oriented. Well appearing and in no distress. Eyes: Conjunctivae are normal.  ENT   Head: Normocephalic and atraumatic.   Mouth/Throat: Mucous membranes  are moist. Cardiovascular: Normal rate, regular rhythm. Normal and symmetric distal pulses are present in all extremities. No murmurs, rubs, or gallops. Respiratory: Normal respiratory effort without tachypnea nor retractions. Breath sounds are clear and equal bilaterally.  Gastrointestinal: Soft and non-tender in all quadrants. No distention. There is no CVA tenderness. Genitourinary: deferred Musculoskeletal: Nontender with normal range of motion in all extremities. No lower extremity tenderness nor edema. Neurologic:  Normal speech and language. No gross focal neurologic deficits are  appreciated. Skin:  Skin is warm, dry and intact. No rash noted. Psychiatric: Mood and affect are normal. Patient exhibits appropriate insight and judgment.  __Patient has some mild swelling in the right side of the neck without a true palpable nodes or masses. __________________________________________    LABS (pertinent positives/negatives)  Labs Reviewed  CBC WITH DIFFERENTIAL/PLATELET  MONONUCLEOSIS SCREEN  TSH  T4, FREE   Laboratory work was reviewed without any significant abnormalities including normal thyroid function ____________________________________________   EKG None  ____________________________________________    RADIOLOGY I have personally reviewed any xrays that were ordered on this patient: Patient's x-ray was reviewed and does not show any acute disease (see radiology note.  ____________________________________________   PROCEDURES  Procedure(s) performed: none  Critical Care performed: None  ____________________________________________   INITIAL IMPRESSION / ASSESSMENT AND PLAN / ED COURSE  Pertinent labs & imaging results that were available during my care of the patient were reviewed by me and considered in my medical decision making (see chart for details).  Patient's stay was uneventful and her adenopathy was investigated. Thyroid function is negative and I felt this was unlikely to be an endocrinology Related issue. I added Keflex as I thought this would give her good coverage for lymphadenitis. Her symptoms seem to be consistent with an upper respiratory tract infection I felt further testing was not necessary at this time. Patient's chest x-ray did not show any indications of pneumonia and also was a negative screen for lymphoma. The patient's was also seen from a Child psychotherapist standpoint [facility]. Patient was discharged without any other questions or concerns.  Acute upper respiratory tract  infection Lymphadenopathy  ____________________________________________   FINAL CLINICAL IMPRESSION(S) / ED DIAGNOSES  Final diagnoses:  None     Jennye Moccasin, MD 09/02/14 [phone]

## 2014-09-02 NOTE — Care Management Note (Signed)
Case Management Note  Patient Details  Name: Petrea Fredenburg MRN: [id number] Date of Birth: Oct 26, 1985  Subjective/Objective:      Pt stating [facility] endocrinology would not see her  After she was seen at an urgent Care locally who said that is whom she should go to.               Action/Plan: Pt.has been provided with the application, hours and general information for the [facility]. She appears appreciative and states this will help a lot. Verbalizes she understands the instructions for completing the application, and she has transportation. E-mailed Lelon Mast to let her know I gave the application to the pt.  Expected Discharge Date:                  Expected Discharge Plan:     In-House Referral:     Discharge planning Services     Post Acute Care Choice:    Choice offered to:     DME Arranged:    DME Agency:     HH Arranged:    HH Agency:     Status of Service:     Medicare Important Message Given:    Date Medicare IM Given:    Medicare IM give by:    Date Additional Medicare IM Given:    Additional Medicare Important Message give by:     If discussed at Long Length of Stay Meetings, dates discussed:    Additional Comments:  Berna Bue, RN 09/02/2014, 10:55 AM

## 2014-09-02 NOTE — ED Notes (Signed)
Swelling noted to right side of neck, pt states she constantly feels exhausted and wanting to sleep, pt states the swelling in her neck now makes it difficult to swallow, pt speaking in full sentances in no distress, pt states she has no insurance and is unable to get in with endocrine, pt states a productive yellow cough, pt has been on abx since Tuesday

## 2014-10-02 ENCOUNTER — Ambulatory Visit
Admission: EM | Admit: 2014-10-02 | Discharge: 2014-10-02 | Disposition: A | Discharge status: Home / Self Care | Payer: Self-pay | Attending: Internal Medicine | Admitting: Internal Medicine

## 2014-10-02 ENCOUNTER — Encounter: Payer: Self-pay | Admitting: Gynecology

## 2014-10-02 DIAGNOSIS — R509 Fever, unspecified: Secondary | ICD-10-CM

## 2014-10-02 DIAGNOSIS — R300 Dysuria: Secondary | ICD-10-CM

## 2014-10-02 LAB — URINALYSIS COMPLETE WITH MICROSCOPIC (ARMC ONLY)

## 2014-10-02 MED ORDER — PHENAZOPYRIDINE HCL 200 MG PO TABS: 200.0000 mg | ORAL_TABLET | Freq: Three times a day (TID) | ORAL | Status: DC

## 2014-10-02 MED ORDER — NITROFURANTOIN MONOHYD MACRO 100 MG PO CAPS: 100.0000 mg | ORAL_CAPSULE | Freq: Two times a day (BID) | ORAL | Status: DC

## 2014-10-02 NOTE — ED Notes (Signed)
Patient c/o x 4 day urination frequency, burning sensation with urine chills. Per patient had a low grade fever of 100.9 x 4 days ago.

## 2014-10-02 NOTE — ED Provider Notes (Addendum)
CSN: [id number]     Arrival date & time 10/02/14  1339 History   First MD Initiated Contact with Patient 10/02/14 1421     Chief Complaint  Patient presents with  . Urinary Tract Infection  . Chills   HPI  Patient is a 29 year old lady who presents with a 4 day history of marked dysuria, urinary frequency. She also has some achiness particularly in her low back and legs, headache. Malaise, fatigue. Mild suprapubic discomfort. She is having some difficulty with tactile temperature, but the only recorded fever was 100.9 4 days ago. No unusual vaginal discharge or bleeding, she has a Mirena. Denies chance of pregnancy. Had a normal bowel movement yesterday, usually has a bowel movement every day. No runny/congested nose, no cough. No prior history of urinary tract infection.  Enlarged thyroid, started about 2 months ago, [facility], after which she anticipates follow-up with an endocrinologist. History reviewed. No pertinent past surgical history.  Social History  Substance Use Topics  . Smoking status: Never Smoker   . Smokeless tobacco: None  . Alcohol Use: No   works in Audiological scientist.  Review of Systems  All other systems reviewed and are negative.   Allergies  Review of patient's allergies indicates no known allergies.  Home Medications   Prior to Admission medications   Medication Sig Start Date End Date Taking? Authorizing Provider  levonorgestrel (MIRENA) 20 MCG/24HR IUD 1 each by Intrauterine route once.   Yes Historical Provider, MD                 Meds Ordered and Administered this Visit    BP 110/76 mmHg  Pulse 96  Temp(Src) 98.6 F (37 C) (Oral)  Resp 16  Ht 5\' 4"  (1.626 m)  Wt 166 lb (75.297 kg)  BMI 28.48 kg/m2  SpO2 99%  LMP 09/28/2014 No data found.   Physical Exam  Constitutional: She is oriented to person, place, and time.  Alert, nicely groomed Sitting up on the end of the exam table, looks ill, but  not toxic  HENT:  Head: Atraumatic.  Eyes:  Conjugate gaze, no eye redness/drainage  Neck: Neck supple.  Cardiovascular: Normal rate.   Pulmonary/Chest: No respiratory distress.  Abdominal: She exhibits no distension.  Musculoskeletal: She exhibits no edema.  No leg swelling  Neurological: She is alert and oriented to person, place, and time.  Skin: Skin is warm and dry.  Maybe a little pale. No cyanosis  Nursing note and vitals reviewed.   ED Course  Procedures  Results for orders placed or performed during the hospital encounter of 10/02/14  Urinalysis complete, with microscopic  Result Value Ref Range   Color, Urine ORANGE (A) YELLOW   APPearance CLEAR CLEAR   Glucose, UA (A) NEGATIVE mg/dL    TEST NOT REPORTED DUE TO COLOR INTERFERENCE OF URINE PIGMENT   Bilirubin Urine (A) NEGATIVE    TEST NOT REPORTED DUE TO COLOR INTERFERENCE OF URINE PIGMENT   Ketones, ur (A) NEGATIVE mg/dL    TEST NOT REPORTED DUE TO COLOR INTERFERENCE OF URINE PIGMENT   Specific Gravity, Urine  1.005 - 1.030    TEST NOT REPORTED DUE TO COLOR INTERFERENCE OF URINE PIGMENT   Hgb urine dipstick (A) NEGATIVE    TEST NOT REPORTED DUE TO COLOR INTERFERENCE OF URINE PIGMENT   pH  5.0 - 8.0    TEST NOT REPORTED DUE TO COLOR INTERFERENCE OF URINE PIGMENT  Protein, ur (A) NEGATIVE mg/dL    TEST NOT REPORTED DUE TO COLOR INTERFERENCE OF URINE PIGMENT   Nitrite (A) NEGATIVE    TEST NOT REPORTED DUE TO COLOR INTERFERENCE OF URINE PIGMENT   Leukocytes, UA (A) NEGATIVE    TEST NOT REPORTED DUE TO COLOR INTERFERENCE OF URINE PIGMENT   RBC / HPF 6-30 <3 RBC/hpf   WBC, UA 6-30 <3 WBC/hpf   Bacteria, UA FEW (A) RARE   Squamous Epithelial / LPF 6-30 (A) RARE        MDM   1. Dysuria   2. Febrile illness, acute    Discharge Medication List as of 10/02/2014  2:32 PM    START taking these medications   Details  nitrofurantoin, macrocrystal-monohydrate, (MACROBID) 100 MG capsule Take 1 capsule (100 mg  total) by mouth 2 (two) times daily., Starting 10/02/2014, Until Discontinued, Normal    phenazopyridine (PYRIDIUM) 200 MG tablet Take 1 tablet (200 mg total) by mouth 3 (three) times daily., Starting 10/02/2014, Until Discontinued, Normal       Urine culture pending. Recheck or follow-up PCP if not starting to improve in a couple of days.  Eustace Moore, MD 10/02/14 1440  Eustace Moore, MD 10/02/14 2009

## 2014-10-02 NOTE — Discharge Instructions (Signed)
Prescriptions for macrobid (nitrofurantoin, an antibiotic) and  pyridium (like azo) were sent to the [facility] on [address]. A urine culture is pending. Recheck if not starting to feel better (less fever, improved energy, and  less stinging with urination) in 2-3 days.  Dysuria Dysuria is the medical term for pain with urination. There are many causes for dysuria, but urinary tract infection is the most common. If a urinalysis was performed it can show that there is a urinary tract infection. A urine culture confirms that you or your child is sick. You will need to follow up with a healthcare provider because:  If a urine culture was done you will need to know the culture results and treatment recommendations.  If the urine culture was positive, you or your child will need to be put on antibiotics or know if the antibiotics prescribed are the right antibiotics for your urinary tract infection.  If the urine culture is negative (no urinary tract infection), then other causes may need to be explored or antibiotics need to be stopped. Today laboratory work may have been done and there does not seem to be an infection. If cultures were done they will take at least 24 to 48 hours to be completed. Today x-rays may have been taken and they read as normal. No cause can be found for the problems. The x-rays may be re-read by a radiologist and you will be contacted if additional findings are made. You or your child may have been put on medications to help with this problem until you can see your primary caregiver. If the problems get better, see your primary caregiver if the problems return. If you were given antibiotics (medications which kill germs), take all of the mediations as directed for the full course of treatment.  If laboratory work was done, you need to find the results. Leave a telephone number where you can be reached. If this is not possible, make sure you find out how you are to get  test results. HOME CARE INSTRUCTIONS   Drink lots of fluids. For adults, drink eight, 8 ounce glasses of clear juice or water a day. For children, replace fluids as suggested by your caregiver.  Empty the bladder often. Avoid holding urine for long periods of time.  After a bowel movement, women should cleanse front to back, using each tissue only once.  Empty your bladder before and after sexual intercourse.  Take all the medicine given to you until it is gone. You may feel better in a few days, but TAKE ALL MEDICINE.  Avoid caffeine, tea, alcohol and carbonated beverages, because they tend to irritate the bladder.  In men, alcohol may irritate the prostate.  Only take over-the-counter or prescription medicines for pain, discomfort, or fever as directed by your caregiver.  If your caregiver has given you a follow-up appointment, it is very important to keep that appointment. Not keeping the appointment could result in a chronic or permanent injury, pain, and disability. If there is any problem keeping the appointment, you must call back to this facility for assistance. SEEK IMMEDIATE MEDICAL CARE IF:   Back pain develops.  A fever develops.  There is nausea (feeling sick to your stomach) or vomiting (throwing up).  Problems are no better with medications or are getting worse. MAKE SURE YOU:   Understand these instructions.  Will watch your condition.  Will get help right away if you are not doing well or get worse. Document  Released: 10/14/2003 Document Revised: 04/09/2011 Document Reviewed: 08/21/2007 [facility] Patient Information 2015 [place], [place]. This information is not intended to replace advice given to you by your health care provider. Make sure you discuss any questions you have with your health care provider.

## 2014-10-04 LAB — URINE CULTURE

## 2014-10-06 ENCOUNTER — Ambulatory Visit: Payer: Self-pay | Admitting: Internal Medicine

## 2014-10-07 ENCOUNTER — Other Ambulatory Visit: Payer: Self-pay

## 2014-10-07 LAB — HEPATIC FUNCTION PANEL
ALT: 14 U/L (ref 7–35)
AST: 16 U/L (ref 13–35)
Alkaline Phosphatase: 58 U/L (ref 25–125)
Bilirubin, Total: 0.2 mg/dL

## 2014-10-07 LAB — LIPID PANEL
Cholesterol: 144 mg/dL (ref 0–200)
HDL: 31 mg/dL — AB (ref 35–70)
LDL Cholesterol: 95 mg/dL
TRIGLYCERIDES: 90 mg/dL (ref 40–160)

## 2014-10-07 LAB — CBC AND DIFFERENTIAL
HEMATOCRIT: 39 % (ref 36–46)
HEMOGLOBIN: 12.9 g/dL (ref 12.0–16.0)
Neutrophils Absolute: 4 /uL
Platelets: 312 10*3/uL (ref 150–399)
WBC: 9.4 10*3/mL

## 2014-10-07 LAB — BASIC METABOLIC PANEL
BUN: 7 mg/dL (ref 4–21)
Creatinine: 0.6 mg/dL (ref 0.5–1.1)
Glucose: 93 mg/dL
Potassium: 3.8 mmol/L (ref 3.4–5.3)
SODIUM: 138 mmol/L (ref 137–147)

## 2014-10-07 LAB — TSH: TSH: 1.3 u[IU]/mL (ref 0.41–5.90)

## 2014-10-12 DIAGNOSIS — A6000 Herpesviral infection of urogenital system, unspecified: Secondary | ICD-10-CM | POA: Insufficient documentation

## 2014-10-13 ENCOUNTER — Other Ambulatory Visit: Payer: Self-pay | Admitting: Internal Medicine

## 2014-10-13 DIAGNOSIS — E041 Nontoxic single thyroid nodule: Secondary | ICD-10-CM

## 2014-11-10 ENCOUNTER — Ambulatory Visit
Admission: RE | Admit: 2014-11-10 | Discharge: 2014-11-10 | Disposition: A | Discharge status: Home / Self Care | Payer: Medicaid Other | Source: Ambulatory Visit | Attending: Internal Medicine | Admitting: Internal Medicine

## 2014-11-10 DIAGNOSIS — E041 Nontoxic single thyroid nodule: Secondary | ICD-10-CM | POA: Insufficient documentation

## 2014-11-30 ENCOUNTER — Ambulatory Visit: Payer: Self-pay

## 2014-11-30 DIAGNOSIS — E049 Nontoxic goiter, unspecified: Secondary | ICD-10-CM | POA: Insufficient documentation

## 2014-12-24 ENCOUNTER — Emergency Department
Admission: EM | Admit: 2014-12-24 | Discharge: 2014-12-24 | Disposition: A | Discharge status: Home / Self Care | Payer: Medicaid Other | Attending: Emergency Medicine | Admitting: Emergency Medicine

## 2014-12-24 DIAGNOSIS — Z79899 Other long term (current) drug therapy: Secondary | ICD-10-CM | POA: Insufficient documentation

## 2014-12-24 DIAGNOSIS — N764 Abscess of vulva: Secondary | ICD-10-CM

## 2014-12-24 DIAGNOSIS — Z792 Long term (current) use of antibiotics: Secondary | ICD-10-CM | POA: Insufficient documentation

## 2014-12-24 HISTORY — DX: Disorder of thyroid, unspecified: E07.9

## 2014-12-24 MED ORDER — OXYCODONE-ACETAMINOPHEN 5-325 MG PO TABS
2.0000 | ORAL_TABLET | Freq: Once | ORAL | Status: AC
Start: 2014-12-24 — End: 2014-12-24
  Administered 2014-12-24: 2 via ORAL
  Filled 2014-12-24: qty 2

## 2014-12-24 MED ORDER — OXYCODONE-ACETAMINOPHEN 5-325 MG PO TABS: 1.0000 | ORAL_TABLET | ORAL | Status: DC | PRN

## 2014-12-24 MED ORDER — SULFAMETHOXAZOLE-TRIMETHOPRIM 800-160 MG PO TABS: 1.0000 | ORAL_TABLET | Freq: Two times a day (BID) | ORAL | Status: DC

## 2014-12-24 MED ORDER — LIDOCAINE HCL (PF) 1 % IJ SOLN
5.0000 mL | Freq: Once | INTRAMUSCULAR | Status: AC
Start: 2014-12-24 — End: 2014-12-24
  Administered 2014-12-24: 5 mL
  Filled 2014-12-24: qty 5

## 2014-12-24 NOTE — ED Provider Notes (Signed)
[facility] Emergency Department Provider Note  ____________________________________________  Time seen: Approximately 10:17 AM  I have reviewed the triage vital signs and the nursing notes.   HISTORY  Chief Complaint Abscess  HPI Kari Frost is a 29 y.o. female is here with complaint of abscess to the left side of her vagina since Sunday. Patient states that she has been soaking in a tub of water but daily the area has gotten larger. It is now extremely painful and nothing over-the-counter is working. She denies any previous abscesses in this area. Currently she rates her pain is 9 out of 10. She denies any vaginal discharge, fever or chills, nausea or vomiting. Walking increases her pain as does sitting.   Past Medical History  Diagnosis Date  . Thyroid disease     There are no active problems to display for this patient.   History reviewed. No pertinent past surgical history.  Current Outpatient Rx  Name  Route  Sig  Dispense  Refill  . levonorgestrel (MIRENA) 20 MCG/24HR IUD   Intrauterine   1 each by Intrauterine route once.         . nitrofurantoin, macrocrystal-monohydrate, (MACROBID) 100 MG capsule   Oral   Take 1 capsule (100 mg total) by mouth 2 (two) times daily.   10 capsule   0   . oxyCODONE-acetaminophen (PERCOCET) 5-325 MG tablet   Oral   Take 1 tablet by mouth every 4 (four) hours as needed for severe pain.   20 tablet   0   . phenazopyridine (PYRIDIUM) 200 MG tablet   Oral   Take 1 tablet (200 mg total) by mouth 3 (three) times daily.   6 tablet   0   . sulfamethoxazole-trimethoprim (BACTRIM DS,SEPTRA DS) 800-160 MG tablet   Oral   Take 1 tablet by mouth 2 (two) times daily.   20 tablet   0     Allergies Review of patient's allergies indicates no known allergies.  No family history on file.  Social History Social History  Substance Use Topics  . Smoking status: Never Smoker   . Smokeless tobacco: None   . Alcohol Use: No    Review of Systems Constitutional: No fever/chills ENT: No sore throat. Cardiovascular: Denies chest pain. Respiratory: Denies shortness of breath. Gastrointestinal:   No nausea, no vomiting.  Genitourinary: Negative for dysuria. Musculoskeletal: Negative for back pain. Skin: Negative for rash. Positive for abscess Neurological: Negative for headaches, focal weakness or numbness.  10-point ROS otherwise negative.  ____________________________________________   PHYSICAL EXAM:  VITAL SIGNS: ED Triage Vitals  Enc Vitals Group     BP 12/24/14 0903 132/75 mmHg     Pulse Rate 12/24/14 0903 92     Resp 12/24/14 0903 16     Temp 12/24/14 0903 98.4 F (36.9 C)     Temp Source 12/24/14 0903 Oral     SpO2 12/24/14 0903 98 %     Weight 12/24/14 0903 170 lb (77.111 kg)     Height 12/24/14 0903  (1.651 m)     Head Cir --      Peak Flow --      Pain Score 12/24/14 0904 9     Pain Loc --      Pain Edu? --      Excl. in GC? --     Constitutional: Alert and oriented. Well appearing and in no acute distress. Eyes: Conjunctivae are normal. PERRL. EOMI. Head: Atraumatic. Nose: No congestion/rhinnorhea.  Neck: No stridor.   Cardiovascular: Normal rate, regular rhythm. Grossly normal heart sounds.  Good peripheral circulation. Respiratory: Normal respiratory effort.  No retractions. Lungs CTAB. Gastrointestinal: Soft and nontender. No distention. Musculoskeletal: Moves all extremities without any difficulty. Neurologic:  Normal speech and language. No gross focal neurologic deficits are appreciated. No gait instability. Skin:  Skin is warm, dry and intact. No rash noted. Left exterior labia with moderate edema and erythema. There is a pustule present with fluctuant soft tissue. No active drainage is noted. Area is extremely tender to touch. Psychiatric: Mood and affect are normal. Speech and behavior are normal.  ____________________________________________    LABS (all labs ordered are listed, but only abnormal results are displayed)  Labs Reviewed - No data to display  PROCEDURES  Procedure(s) performed: INCISION AND DRAINAGE Performed by: Tommi Rumpshonda L Merrell Rettinger Consent: Verbal consent obtained. Risks and benefits: risks, benefits and alternatives were discussed Type: abscess  Body area: Left labia  Anesthesia: local infiltration  Incision was made with a scalpel.  Local anesthetic: lidocaine 1 % without epinephrine  Anesthetic total: 2ml  Complexity: complex Blunt dissection to break up loculations  Drainage: purulent  Drainage amount: Moderate   Packing material: 1/4 in iodoform gauze  Patient tolerance: Patient tolerated the procedure well with no immediate complications.    Critical Care performed: No  ____________________________________________   INITIAL IMPRESSION / ASSESSMENT AND PLAN / ED COURSE  Pertinent labs & imaging results that were available during my care of the patient were reviewed by me and considered in my medical decision making (see chart for details).  Patient was started on Percocet as needed for severe pain and Bactrim twice a day for 10 days. Patient is to return to the emergency room in 2 days for packing removal. She'll return to the emergency room sooner if any severe worsening of her symptoms. ____________________________________________   FINAL CLINICAL IMPRESSION(S) / ED DIAGNOSES  Final diagnoses:  Left genital labial abscess      Tommi RumpsRhonda L Aarushi Hemric, PA-C 12/24/14 1403  Phineas SemenGraydon Goodman, MD 12/24/14 1540

## 2014-12-24 NOTE — Discharge Instructions (Signed)
Abscess °An abscess (boil or furuncle) is an infected area on or under the skin. This area is filled with yellowish-white fluid (pus) and other material (debris). °HOME CARE  °· Only take medicines as told by your doctor. °· If you were given antibiotic medicine, take it as directed. Finish the medicine even if you start to feel better. °· If gauze is used, follow your doctor's directions for changing the gauze. °· To avoid spreading the infection: °· Keep your abscess covered with a bandage. °· Wash your hands well. °· Do not share personal care items, towels, or whirlpools with others. °· Avoid skin contact with others. °· Keep your skin and clothes clean around the abscess. °· Keep all doctor visits as told. °GET HELP RIGHT AWAY IF:  °· You have more pain, puffiness (swelling), or redness in the wound site. °· You have more fluid or blood coming from the wound site. °· You have muscle aches, chills, or you feel sick. °· You have a fever. °MAKE SURE YOU:  °· Understand these instructions. °· Will watch your condition. °· Will get help right away if you are not doing well or get worse. °  °This information is not intended to replace advice given to you by your health care provider. Make sure you discuss any questions you have with your health care provider. °  °Document Released: 07/04/2007 Document Revised: 07/17/2011 Document Reviewed: 03/31/2011 °Elsevier Interactive Patient Education ©2016 Elsevier Inc. ° °Incision and Drainage °Incision and drainage is a procedure in which a sac-like structure (cystic structure) is opened and drained. The area to be drained usually contains material such as pus, fluid, or blood.  °LET YOUR CAREGIVER KNOW ABOUT:  °· Allergies to medicine. °· Medicines taken, including vitamins, herbs, eyedrops, over-the-counter medicines, and creams. °· Use of steroids (by mouth or creams). °· Previous problems with anesthetics or numbing medicines. °· History of bleeding problems or blood  clots. °· Previous surgery. °· Other health problems, including diabetes and kidney problems. °· Possibility of pregnancy, if this applies. °RISKS AND COMPLICATIONS °· Pain. °· Bleeding. °· Scarring. °· Infection. °BEFORE THE PROCEDURE  °You may need to have an ultrasound or other imaging tests to see how large or deep your cystic structure is. Blood tests may also be used to determine if you have an infection or how severe the infection is. You may need to have a tetanus shot. °PROCEDURE  °The affected area is cleaned with a cleaning fluid. The cyst area will then be numbed with a medicine (local anesthetic). A small incision will be made in the cystic structure. A syringe or catheter may be used to drain the contents of the cystic structure, or the contents may be squeezed out. The area will then be flushed with a cleansing solution. After cleansing the area, it is often gently packed with a gauze or another wound dressing. Once it is packed, it will be covered with gauze and tape or some other type of wound dressing.  °AFTER THE PROCEDURE  °· Often, you will be allowed to go home right after the procedure. °· You may be given antibiotic medicine to prevent or heal an infection. °· If the area was packed with gauze or some other wound dressing, you will likely need to come back in 1 to 2 days to get it removed. °· The area should heal in about 14 days. °  °This information is not intended to replace advice given to you by your health care provider.   Make sure you discuss any questions you have with your health care provider.   Document Released: 07/11/2000 Document Revised: 07/17/2011 Document Reviewed: 03/12/2011 Elsevier Interactive Patient Education [employer]2016 Elsevier Inc.    Take antibiotics as directed. Percocet as needed for pain. The aware that you cannot take pain medication while driving or operating machinery. Return in 2 days for packing removal.

## 2014-12-24 NOTE — ED Notes (Signed)
Pt c/o swelling and pain to the left labia area since sunday

## 2014-12-24 NOTE — ED Notes (Addendum)
Pt reports swelling to left side of vagina since Sunday, denies discharge or drainage. Reports difficulty sitting, crossing legs, etc. Pain 8-9/10. Pt state she has been doing baths 4 times a day to help with the swelling and using ice but nothing is working.

## 2014-12-24 NOTE — ED Notes (Signed)
Pad given to patient for use.

## 2014-12-26 ENCOUNTER — Encounter: Payer: Self-pay | Admitting: Emergency Medicine

## 2014-12-26 ENCOUNTER — Emergency Department
Admission: EM | Admit: 2014-12-26 | Discharge: 2014-12-26 | Disposition: A | Discharge status: Home / Self Care | Payer: Medicaid Other | Attending: Emergency Medicine | Admitting: Emergency Medicine

## 2014-12-26 DIAGNOSIS — Z792 Long term (current) use of antibiotics: Secondary | ICD-10-CM | POA: Insufficient documentation

## 2014-12-26 DIAGNOSIS — Z79899 Other long term (current) drug therapy: Secondary | ICD-10-CM | POA: Insufficient documentation

## 2014-12-26 DIAGNOSIS — Z09 Encounter for follow-up examination after completed treatment for conditions other than malignant neoplasm: Secondary | ICD-10-CM

## 2014-12-26 DIAGNOSIS — N764 Abscess of vulva: Secondary | ICD-10-CM

## 2014-12-26 NOTE — ED Notes (Signed)
Patient presents to the ED for a wound re-check.  Patient had an abscess on her groin lanced in the ED on Friday.  Patient states pain has improved since lancing and denies any noticeable issues with wound.  Patient ambulatory to registration desk without obvious difficulty.

## 2014-12-26 NOTE — ED Notes (Signed)
Patient here on Friday and had I & D of abscess in vaginal area. Per patient incision was packed with gauze. Gauze came out this morning. Patient reports blood tinged and light yellow discharge from the incision and mild odor.

## 2014-12-26 NOTE — ED Provider Notes (Signed)
[facility] Emergency Department Provider Note  ____________________________________________  Time seen: Approximately 9:49 AM  I have reviewed the triage vital signs and the nursing notes.   HISTORY  Chief Complaint Wound Check    HPI Kari Frost is a 29 y.o. female who returns to the emergency Department today status post incision and drainage ofleft labial abscess. Patient reports that it was packed with gauze however the gauze "fell out" on its own this morning. She does endorse very slight bloody tinged purulent drainage over the past 2 days. Patient is on Bactrim and states she is compliant with her medications.   Past Medical History  Diagnosis Date  . Thyroid disease     There are no active problems to display for this patient.   History reviewed. No pertinent past surgical history.  Current Outpatient Rx  Name  Route  Sig  Dispense  Refill  . levonorgestrel (MIRENA) 20 MCG/24HR IUD   Intrauterine   1 each by Intrauterine route once.         . nitrofurantoin, macrocrystal-monohydrate, (MACROBID) 100 MG capsule   Oral   Take 1 capsule (100 mg total) by mouth 2 (two) times daily.   10 capsule   0   . oxyCODONE-acetaminophen (PERCOCET) 5-325 MG tablet   Oral   Take 1 tablet by mouth every 4 (four) hours as needed for severe pain.   20 tablet   0   . phenazopyridine (PYRIDIUM) 200 MG tablet   Oral   Take 1 tablet (200 mg total) by mouth 3 (three) times daily.   6 tablet   0   . sulfamethoxazole-trimethoprim (BACTRIM DS,SEPTRA DS) 800-160 MG tablet   Oral   Take 1 tablet by mouth 2 (two) times daily.   20 tablet   0     Allergies Review of patient's allergies indicates no known allergies.  No family history on file.  Social History Social History  Substance Use Topics  . Smoking status: Never Smoker   . Smokeless tobacco: None  . Alcohol Use: No    Review of Systems Constitutional: No fever/chills Eyes: No  visual changes. ENT: No sore throat. Cardiovascular: Denies chest pain. Respiratory: Denies shortness of breath. Gastrointestinal: No abdominal pain.  No nausea, no vomiting.  No diarrhea.  No constipation. Genitourinary: Negative for dysuria. Endorses left labial abscess. Musculoskeletal: Negative for back pain. Skin: Negative for rash. Neurological: Negative for headaches, focal weakness or numbness.  10-point ROS otherwise negative.  ____________________________________________   PHYSICAL EXAM:  VITAL SIGNS: ED Triage Vitals  Enc Vitals Group     BP 12/26/14 0931 118/64 mmHg     Pulse Rate 12/26/14 0931 80     Resp 12/26/14 0931 18     Temp 12/26/14 0931 98.5 F (36.9 C)     Temp Source 12/26/14 0931 Oral     SpO2 12/26/14 0931 99 %     Weight 12/26/14 0931 170 lb (77.111 kg)     Height 12/26/14 0931  (1.651 m)     Head Cir --      Peak Flow --      Pain Score --      Pain Loc --      Pain Edu? --      Excl. in GC? --     Constitutional: Alert and oriented. Well appearing and in no acute distress. Eyes: Conjunctivae are normal. PERRL. EOMI. Head: Atraumatic. Nose: No congestion/rhinnorhea. Mouth/Throat: Mucous membranes are moist.  Oropharynx  non-erythematous. Neck: No stridor.   Cardiovascular: Normal rate, regular rhythm. Grossly normal heart sounds.  Good peripheral circulation. Respiratory: Normal respiratory effort.  No retractions. Lungs CTAB. Gastrointestinal: Soft and nontender. No distention. No abdominal bruits. No CVA tenderness. Genitourinary: External genitalia exam was performed. Edema noted to the left labia with minimal erythema. Incision is healing appropriately with no obvious discharge. I&D site is healing and no packing is able to be placed. Musculoskeletal: No lower extremity tenderness nor edema.  No joint effusions. Neurologic:  Normal speech and language. No gross focal neurologic deficits are appreciated. No gait instability. Skin:   Skin is warm, dry and intact. No rash noted. Psychiatric: Mood and affect are normal. Speech and behavior are normal.  External genitalia exam was chaperoned by Kathlee NationsAngela D. Sherral Hammersobbins Charity fundraiserN. ____________________________________________   Vickie EpleyLABS (all labs ordered are listed, but only abnormal results are displayed)  Labs Reviewed - No data to display ____________________________________________  EKG   ____________________________________________  RADIOLOGY   ____________________________________________   PROCEDURES  Procedure(s) performed: None    Critical Care performed: No  ____________________________________________   INITIAL IMPRESSION / ASSESSMENT AND PLAN / ED COURSE  Pertinent labs & imaging results that were available during my care of the patient were reviewed by me and considered in my medical decision making (see chart for details).  Patient's history, symptoms, physical exam are taken into consideration for diagnosis. Patient was seen 2 days prior for an incision and drainage of an abscess in the left labia. Area was reexamined today with good signs of improvement. There is still edema present but no erythema and no pustular drainage. The wound is healing appropriately from the inside out and no packing is placed. Advised patient to continue antibiotics for complete course. Patient is given strict ED precautions to return for development of fever or chills, increasing pain, increasing drainage, nausea or vomiting. Patient verbalizes understanding of diagnosis and treatment plan and verbalizes compliance with same. ____________________________________________   FINAL CLINICAL IMPRESSION(S) / ED DIAGNOSES  Final diagnoses:  Encounter for recheck of abscess following incision and drainage  Left genital labial abscess      Racheal PatchesJonathan D Radiance Deady, PA-C 12/26/14 1027  Phineas SemenGraydon Goodman, MD 12/26/14 1252

## 2014-12-26 NOTE — Discharge Instructions (Signed)
Abscess °An abscess is an infected area that contains a collection of pus and debris. It can occur in almost any part of the body. An abscess is also known as a furuncle or boil. °CAUSES  °An abscess occurs when tissue gets infected. This can occur from blockage of oil or sweat glands, infection of hair follicles, or a minor injury to the skin. As the body tries to fight the infection, pus collects in the area and creates pressure under the skin. This pressure causes pain. People with weakened immune systems have difficulty fighting infections and get certain abscesses more often.  °SYMPTOMS °Usually an abscess develops on the skin and becomes a painful mass that is red, warm, and tender. If the abscess forms under the skin, you may feel a moveable soft area under the skin. Some abscesses break open (rupture) on their own, but most will continue to get worse without care. The infection can spread deeper into the body and eventually into the bloodstream, causing you to feel ill.  °DIAGNOSIS  °Your caregiver will take your medical history and perform a physical exam. A sample of fluid may also be taken from the abscess to determine what is causing your infection. °TREATMENT  °Your caregiver may prescribe antibiotic medicines to fight the infection. However, taking antibiotics alone usually does not cure an abscess. Your caregiver may need to make a small cut (incision) in the abscess to drain the pus. In some cases, gauze is packed into the abscess to reduce pain and to continue draining the area. °HOME CARE INSTRUCTIONS  °· Only take over-the-counter or prescription medicines for pain, discomfort, or fever as directed by your caregiver. °· If you were prescribed antibiotics, take them as directed. Finish them even if you start to feel better. °· If gauze is used, follow your caregiver's directions for changing the gauze. °· To avoid spreading the infection: °¨ Keep your draining abscess covered with a  bandage. °¨ Wash your hands well. °¨ Do not share personal care items, towels, or whirlpools with others. °¨ Avoid skin contact with others. °· Keep your skin and clothes clean around the abscess. °· Keep all follow-up appointments as directed by your caregiver. °SEEK MEDICAL CARE IF:  °· You have increased pain, swelling, redness, fluid drainage, or bleeding. °· You have muscle aches, chills, or a general ill feeling. °· You have a fever. °MAKE SURE YOU:  °· Understand these instructions. °· Will watch your condition. °· Will get help right away if you are not doing well or get worse. °  °This information is not intended to replace advice given to you by your health care provider. Make sure you discuss any questions you have with your health care provider. °  °Document Released: 10/25/2004 Document Revised: 07/17/2011 Document Reviewed: 03/30/2011 °Elsevier Interactive Patient Education ©2016 [employer] °Wound Care °Taking care of your wound properly can help to prevent pain and infection. It can also help your wound to heal more quickly.  °HOW TO CARE FOR YOUR WOUND  °· Take or apply over-the-counter and prescription medicines only as told by your health care provider. °· If you were prescribed antibiotic medicine, take or apply it as told by your health care provider. Do not stop using the antibiotic even if your condition improves. °· Clean the wound each day or as told by your health care provider. °¨ Wash the wound with mild soap and water. °¨ Rinse the wound with water to remove all soap. °¨ Pat the wound dry   with a clean towel. Do not rub it. °· There are many different ways to close and cover a wound. For example, a wound can be covered with stitches (sutures), skin glue, or adhesive strips. Follow instructions from your health care provider about: °¨ How to take care of your wound. °¨ When and how you should change your bandage (dressing). °¨ When you should remove your dressing. °¨ Removing whatever  was used to close your wound. °· Check your wound every day for signs of infection. Watch for: °¨ Redness, swelling, or pain. °¨ Fluid, blood, or pus. °· Keep the dressing dry until your health care provider says it can be removed. Do not take baths, swim, use a hot tub, or do anything that would put your wound underwater until your health care provider approves. °· Raise (elevate) the injured area above the level of your heart while you are sitting or lying down. °· Do not scratch or pick at the wound. °· Keep all follow-up visits as told by your health care provider. This is important. °SEEK MEDICAL CARE IF: °· You received a tetanus shot and you have swelling, severe pain, redness, or bleeding at the injection site. °· You have a fever. °· Your pain is not controlled with medicine. °· You have increased redness, swelling, or pain at the site of your wound. °· You have fluid, blood, or pus coming from your wound. °· You notice a bad smell coming from your wound or your dressing. °SEEK IMMEDIATE MEDICAL CARE IF: °· You have a red streak going away from your wound. °  °This information is not intended to replace advice given to you by your health care provider. Make sure you discuss any questions you have with your health care provider. °  °Document Released: 10/25/2007 Document Revised: 06/01/2014 Document Reviewed: 01/11/2014 °Elsevier Interactive Patient Education ©2016 [employer] ° °

## 2015-02-08 ENCOUNTER — Other Ambulatory Visit: Payer: Self-pay

## 2015-02-10 ENCOUNTER — Other Ambulatory Visit: Payer: Self-pay

## 2015-02-15 ENCOUNTER — Institutional Professional Consult (permissible substitution): Payer: Self-pay

## 2015-03-18 DIAGNOSIS — E049 Nontoxic goiter, unspecified: Secondary | ICD-10-CM

## 2015-04-05 ENCOUNTER — Ambulatory Visit
Admission: EM | Admit: 2015-04-05 | Discharge: 2015-04-05 | Disposition: A | Discharge status: Home / Self Care | Payer: BLUE CROSS/BLUE SHIELD | Attending: Family Medicine | Admitting: Family Medicine

## 2015-04-05 ENCOUNTER — Telehealth: Payer: Self-pay

## 2015-04-05 ENCOUNTER — Encounter: Payer: Self-pay | Admitting: Emergency Medicine

## 2015-04-05 DIAGNOSIS — L02416 Cutaneous abscess of left lower limb: Secondary | ICD-10-CM

## 2015-04-05 DIAGNOSIS — L03116 Cellulitis of left lower limb: Secondary | ICD-10-CM | POA: Diagnosis not present

## 2015-04-05 MED ORDER — MUPIROCIN 2 % EX OINT: 1.0000 "application " | TOPICAL_OINTMENT | Freq: Three times a day (TID) | CUTANEOUS | Status: DC

## 2015-04-05 MED ORDER — OXYCODONE-ACETAMINOPHEN 5-325 MG PO TABS: 1.0000 | ORAL_TABLET | Freq: Three times a day (TID) | ORAL | Status: DC | PRN

## 2015-04-05 MED ORDER — KETOROLAC TROMETHAMINE 60 MG/2ML IM SOLN
60.0000 mg | Freq: Once | INTRAMUSCULAR | Status: AC
  Administered 2015-04-05: 60 mg via INTRAMUSCULAR

## 2015-04-05 MED ORDER — SULFAMETHOXAZOLE-TRIMETHOPRIM 800-160 MG PO TABS: 1.0000 | ORAL_TABLET | Freq: Two times a day (BID) | ORAL | Status: DC

## 2015-04-05 NOTE — Telephone Encounter (Signed)
Received voice mail from patient wanting to know her scheduled appointment dates for march. [phone] Called patent and gave her appointment information.

## 2015-04-05 NOTE — Discharge Instructions (Signed)
Abscess °An abscess (boil or furuncle) is an infected area on or under the skin. This area is filled with yellowish-white fluid (pus) and other material (debris). °HOME CARE  °· Only take medicines as told by your doctor. °· If you were given antibiotic medicine, take it as directed. Finish the medicine even if you start to feel better. °· If gauze is used, follow your doctor's directions for changing the gauze. °· To avoid spreading the infection: °· Keep your abscess covered with a bandage. °· Wash your hands well. °· Do not share personal care items, towels, or whirlpools with others. °· Avoid skin contact with others. °· Keep your skin and clothes clean around the abscess. °· Keep all doctor visits as told. °GET HELP RIGHT AWAY IF:  °· You have more pain, puffiness (swelling), or redness in the wound site. °· You have more fluid or blood coming from the wound site. °· You have muscle aches, chills, or you feel sick. °· You have a fever. °MAKE SURE YOU:  °· Understand these instructions. °· Will watch your condition. °· Will get help right away if you are not doing well or get worse. °  °This information is not intended to replace advice given to you by your health care provider. Make sure you discuss any questions you have with your health care provider. °  °Document Released: 07/04/2007 Document Revised: 07/17/2011 Document Reviewed: 03/31/2011 °Elsevier Interactive Patient Education ©2016 [employer] ° °Cellulitis °Cellulitis is an infection of the skin and the tissue under the skin. The infected area is usually red and tender. This happens most often in the arms and lower legs. °HOME CARE  °· Take your antibiotic medicine as told. Finish the medicine even if you start to feel better. °· Keep the infected arm or leg raised (elevated). °· Put a warm cloth on the area up to 4 times per day. °· Only take medicines as told by your doctor. °· Keep all doctor visits as told. °GET HELP IF: °· You see red streaks on  the skin coming from the infected area. °· Your red area gets bigger or turns a dark color. °· Your bone or joint under the infected area is painful after the skin heals. °· Your infection comes back in the same area or different area. °· You have a puffy (swollen) bump in the infected area. °· You have new symptoms. °· You have a fever. °GET HELP RIGHT AWAY IF:  °· You feel very sleepy. °· You throw up (vomit) or have watery poop (diarrhea). °· You feel sick and have muscle aches and pains. °  °This information is not intended to replace advice given to you by your health care provider. Make sure you discuss any questions you have with your health care provider. °  °Document Released: 07/04/2007 Document Revised: 10/06/2014 Document Reviewed: 04/02/2011 °Elsevier Interactive Patient Education ©2016 Elsevier Inc. ° °

## 2015-04-05 NOTE — ED Provider Notes (Signed)
CSN: [id number]     Arrival date & time 04/05/15  1642 History   First MD Initiated Contact with Patient 04/05/15 2020    Nurses notes were reviewed. Chief Complaint  Patient presents with  . Abscess   Patient had an abscess on the left thigh that started Saturday. She is not sure what happened. She states were per jeans that she not one for a while and didn't know whether she was bit by a spider present day went on irritation continued and the swelling got worse. She was at [facility] able to walk today and came in to be seen after having leave work today.  She had a labia abscess before in the past she never smoked she is gravida 2 para 2 and no pertinent family medical history is pleasant. She is a history of thyroid disease and UTIs in the past.     (Consider location/radiation/quality/duration/timing/severity/associated sxs/prior Treatment) Patient is a 30 y.o. female presenting with abscess. The history is provided by the patient and the spouse. No language interpreter was used.  Abscess Location:  Leg Leg abscess location:  L upper leg Abscess quality: fluctuance, induration, painful, redness and warmth   Red streaking: yes   Duration:  4 days Progression:  Worsening Pain details:    Quality:  Pressure and sharp   Severity:  Severe Relieved by:  Nothing Worsened by:  Nothing tried Associated symptoms: fever   Risk factors: hx of MRSA     Past Medical History  Diagnosis Date  . Thyroid disease   . UTI (urinary tract infection)    History reviewed. No pertinent past surgical history. History reviewed. No pertinent family history. Social History  Substance Use Topics  . Smoking status: Never Smoker   . Smokeless tobacco: None  . Alcohol Use: No   OB History    No data available     Review of Systems  Constitutional: Positive for fever.  All other systems reviewed and are negative.   Allergies  Review of patient's allergies indicates no known allergies.  Home  Medications   Prior to Admission medications   Medication Sig Start Date End Date Taking? Authorizing Provider  levonorgestrel (MIRENA) 20 MCG/24HR IUD 1 each by Intrauterine route once.    Historical Provider, MD  mupirocin ointment (BACTROBAN) 2 % Apply 1 application topically 3 (three) times daily. 04/05/15   Hassan Rowan, MD  nitrofurantoin, macrocrystal-monohydrate, (MACROBID) 100 MG capsule Take 1 capsule (100 mg total) by mouth 2 (two) times daily. 10/02/14   Eustace Moore, MD  oxyCODONE-acetaminophen (PERCOCET) 5-325 MG tablet Take 1 tablet by mouth every 4 (four) hours as needed for severe pain. 12/24/14   Tommi Rumps, PA-C  oxyCODONE-acetaminophen (PERCOCET/ROXICET) 5-325 MG tablet Take 1 tablet by mouth every 8 (eight) hours as needed for moderate pain or severe pain. 04/05/15   Hassan Rowan, MD  phenazopyridine (PYRIDIUM) 200 MG tablet Take 1 tablet (200 mg total) by mouth 3 (three) times daily. 10/02/14   Eustace Moore, MD  sulfamethoxazole-trimethoprim (BACTRIM DS,SEPTRA DS) 800-160 MG tablet Take 1 tablet by mouth 2 (two) times daily. 12/24/14   Tommi Rumps, PA-C  sulfamethoxazole-trimethoprim (BACTRIM DS,SEPTRA DS) 800-160 MG tablet Take 1 tablet by mouth 2 (two) times daily. 04/05/15   Hassan Rowan, MD   Meds Ordered and Administered this Visit   Medications  ketorolac (TORADOL) injection 60 mg (60 mg Intramuscular Given 04/05/15 2035)    BP 137/76 mmHg  Pulse 108  Temp(Src) 98.6  F (37 C) (Oral)  Resp 16  Ht 5\' 5"  (1.651 m)  Wt 165 lb (74.844 kg)  BMI 27.46 kg/m2  SpO2 100%  LMP 03/15/2015 (Exact Date) No data found.   Physical Exam  Constitutional: She is oriented to person, place, and time. She appears well-developed and well-nourished.  HENT:  Head: Normocephalic and atraumatic.  Eyes: Conjunctivae are normal. Pupils are equal, round, and reactive to light.  Musculoskeletal: She exhibits tenderness.  Neurological: She is alert and oriented to person, place, and  time. No cranial nerve deficit.  Skin: Skin is warm. Lesion noted. There is erythema.     Left upper thigh abscess is present  Psychiatric: She has a normal mood and affect.  Vitals reviewed.   ED Course  .Marland Kitchen.Incision and Drainage Date/Time: 04/05/2015 9:15 PM Performed by: Hassan RowanWADE, Carsen Machi Authorized by: Hassan RowanWADE, Emery Binz Consent: Verbal consent obtained. Consent given by: patient and spouse Type: abscess Body area: lower extremity Location details: left leg Anesthesia: local infiltration Local anesthetic: lidocaine 1% with epinephrine Patient sedated: no Scalpel size: 11 Incision type: single straight Incision depth: dermal Complexity: simple Drainage: purulent Drainage amount: moderate Wound treatment: drain placed and  wound left open Packing material: 1/2 in gauze Patient tolerance: Patient tolerated the procedure well with no immediate complications Comments: Patient tolerated procedure well with I&D done   (including critical care time)  Labs Review Labs Reviewed  CULTURE, ROUTINE-ABSCESS  WOUND CULTURE    Imaging Review No results found.   Visual Acuity Review  Right Eye Distance:   Left Eye Distance:   Bilateral Distance:    Right Eye Near:   Left Eye Near:    Bilateral Near:         MDM   1. Abscess of left thigh   2. Cellulitis of left thigh    Patient tolerated I&D quite well. Culture the wound was obtained after irrigation. Patient was instructed to return in 2 days for removal of the packing. Also irrigation was done with 2% lidocaine as well. Patient is placed on her except 5-25 for pain given a shot of Toradol 60 mg once she was here and so Septra DS and prescription for Bactroban ointment to use once the packing is removed. Return Thursday for packing removal and possible reinsertion and also work note given to Friday..   Note: This dictation was prepared with Dragon dictation along with smaller phrase technology. Any transcriptional errors that  result from this process are unintentional.    Hassan RowanEugene Glorene Leitzke, MD 04/05/15 2120

## 2015-04-05 NOTE — ED Notes (Signed)
Patient c/o red swollen bump on her left inner thigh since Saturday.  Patient reports fevers.

## 2015-04-07 ENCOUNTER — Ambulatory Visit
Admission: EM | Admit: 2015-04-07 | Discharge: 2015-04-07 | Disposition: A | Discharge status: Home / Self Care | Payer: BLUE CROSS/BLUE SHIELD | Attending: Family Medicine | Admitting: Family Medicine

## 2015-04-07 DIAGNOSIS — Z4801 Encounter for change or removal of surgical wound dressing: Secondary | ICD-10-CM

## 2015-04-07 DIAGNOSIS — L02416 Cutaneous abscess of left lower limb: Secondary | ICD-10-CM

## 2015-04-07 NOTE — ED Notes (Signed)
I&D left upper inner thigh 2 days ago. Here for wound recheck

## 2015-04-07 NOTE — Discharge Instructions (Signed)
Abscess °An abscess (boil or furuncle) is an infected area on or under the skin. This area is filled with yellowish-white fluid (pus) and other material (debris). °HOME CARE  °· Only take medicines as told by your doctor. °· If you were given antibiotic medicine, take it as directed. Finish the medicine even if you start to feel better. °· If gauze is used, follow your doctor's directions for changing the gauze. °· To avoid spreading the infection: °¨ Keep your abscess covered with a bandage. °¨ Wash your hands well. °¨ Do not share personal care items, towels, or whirlpools with others. °¨ Avoid skin contact with others. °· Keep your skin and clothes clean around the abscess. °· Keep all doctor visits as told. °GET HELP RIGHT AWAY IF:  °· You have more pain, puffiness (swelling), or redness in the wound site. °· You have more fluid or blood coming from the wound site. °· You have muscle aches, chills, or you feel sick. °· You have a fever. °MAKE SURE YOU:  °· Understand these instructions. °· Will watch your condition. °· Will get help right away if you are not doing well or get worse. °  °This information is not intended to replace advice given to you by your health care provider. Make sure you discuss any questions you have with your health care provider. °  °Document Released: 07/04/2007 Document Revised: 07/17/2011 Document Reviewed: 03/31/2011 °Elsevier Interactive Patient Education ©2016 [employer] Inc. ° °

## 2015-04-07 NOTE — ED Provider Notes (Signed)
CSN: [id number]     Arrival date & time 04/07/15  1742 History   First MD Initiated Contact with Patient 04/07/15 1921    Nurses notes were reviewed. Chief Complaint  Patient presents with  . Abscess  Left thigh abscess is doing much better (Consider location/radiation/quality/duration/timing/severity/associated sxs/prior Treatment) Patient is a 30 y.o. female presenting with wound check. The history is provided by the patient. No language interpreter was used.  Wound Check This is a new problem. The current episode started 2 days ago. The problem occurs constantly. The problem has been gradually improving. Pertinent negatives include no chest pain, no abdominal pain, no headaches and no shortness of breath. The symptoms are aggravated by walking. Nothing relieves the symptoms. She has tried nothing for the symptoms.    Past Medical History  Diagnosis Date  . Thyroid disease   . UTI (urinary tract infection)    History reviewed. No pertinent past surgical history. History reviewed. No pertinent family history. Social History  Substance Use Topics  . Smoking status: Never Smoker   . Smokeless tobacco: None  . Alcohol Use: No   OB History    No data available     Review of Systems  Respiratory: Negative for shortness of breath.   Cardiovascular: Negative for chest pain.  Gastrointestinal: Negative for abdominal pain.  Neurological: Negative for headaches.  All other systems reviewed and are negative.   Allergies  Review of patient's allergies indicates no known allergies.  Home Medications   Prior to Admission medications   Medication Sig Start Date End Date Taking? Authorizing Provider  levonorgestrel (MIRENA) 20 MCG/24HR IUD 1 each by Intrauterine route once.   Yes Historical Provider, MD  nitrofurantoin, macrocrystal-monohydrate, (MACROBID) 100 MG capsule Take 1 capsule (100 mg total) by mouth 2 (two) times daily. 10/02/14  Yes Eustace MooreLaura W Murray, MD  oxyCODONE-acetaminophen  (PERCOCET) 5-325 MG tablet Take 1 tablet by mouth every 4 (four) hours as needed for severe pain. 12/24/14  Yes Tommi Rumpshonda L Summers, PA-C  sulfamethoxazole-trimethoprim (BACTRIM DS,SEPTRA DS) 800-160 MG tablet Take 1 tablet by mouth 2 (two) times daily. 04/05/15  Yes Hassan RowanEugene Aime Carreras, MD  mupirocin ointment (BACTROBAN) 2 % Apply 1 application topically 3 (three) times daily. 04/05/15   Hassan RowanEugene Cheral Cappucci, MD  oxyCODONE-acetaminophen (PERCOCET/ROXICET) 5-325 MG tablet Take 1 tablet by mouth every 8 (eight) hours as needed for moderate pain or severe pain. 04/05/15   Hassan RowanEugene Srihitha Tagliaferri, MD  phenazopyridine (PYRIDIUM) 200 MG tablet Take 1 tablet (200 mg total) by mouth 3 (three) times daily. 10/02/14   Eustace MooreLaura W Murray, MD  sulfamethoxazole-trimethoprim (BACTRIM DS,SEPTRA DS) 800-160 MG tablet Take 1 tablet by mouth 2 (two) times daily. 12/24/14   Tommi Rumpshonda L Summers, PA-C   Meds Ordered and Administered this Visit  Medications - No data to display  BP 113/81 mmHg  Pulse 90  Temp(Src) 97.5 F (36.4 C) (Tympanic)  Resp 16  Ht 5\' 5"  (1.651 m)  Wt 165 lb (74.844 kg)  BMI 27.46 kg/m2  SpO2 100%  LMP 03/15/2015 (Exact Date) No data found.   Physical Exam  Constitutional: She is oriented to person, place, and time. She appears well-developed and well-nourished.  HENT:  Head: Normocephalic and atraumatic.  Eyes: Pupils are equal, round, and reactive to light.  Neck: Neck supple.  Musculoskeletal: Normal range of motion.  Neurological: She is alert and oriented to person, place, and time. No cranial nerve deficit.  Skin: Skin is warm and dry. Rash noted. There is erythema.  Abscess is doing great wound is gaping open packing was removed but she didn't critical care for but I do not think repacking as needed because the way the wound is open and able to drain. Recommend Bactroban ointment to start using that now and continue the Septra twice a day.  Psychiatric: She has a normal mood and affect.  Vitals reviewed.   ED  Course  Procedures (including critical care time)  Labs Review Labs Reviewed - No data to display  Imaging Review No results found.   Visual Acuity Review  Right Eye Distance:   Left Eye Distance:   Bilateral Distance:    Right Eye Near:   Left Eye Near:    Bilateral Near:         MDM  No diagnosis found. Patient will be released for marked immediate care at this time. She may allow her to start taking regular baths about 2 day. Will have her start using Bactroban now the wound is dry and should not require further packing at this time.    Hassan Rowan, MD 04/07/15 2002

## 2015-04-08 LAB — CULTURE, ROUTINE-ABSCESS

## 2015-04-12 ENCOUNTER — Other Ambulatory Visit: Payer: Self-pay

## 2015-04-19 ENCOUNTER — Ambulatory Visit: Payer: Self-pay | Admitting: Physician Assistant

## 2015-04-19 NOTE — Progress Notes (Unsigned)
[facility] Progress Note  04/19/2015  Name: Kari CooleySamantha Frost  MRN: [id number]  Date of Birth: 18-Sep-1985  Chief Complaint: No chief complaint on file.   HPI: HPI  Past Medical History:  Past Medical History  Diagnosis Date  . Thyroid disease   . UTI (urinary tract infection)     Past Surgical History: No past surgical history on file.  Past Family History: No family history on file.  Diabetes Management:  No results found for: HGBA1C  Meter here today{question; yes no :20885}  Hypoglycemia in the last 3 months{question; yes no :20885}  Highest blood sugar seen in last 1-2 months? ***  Reported blood sugar average:  Trouble with managing blood sugar :  New Complaints: ***  Personal Diabetes Goal: ***  {[id number]}{[id number]}  Clinical Assessment & Plan  ***

## 2015-05-25 ENCOUNTER — Encounter: Payer: Self-pay | Admitting: Nurse Practitioner

## 2015-05-31 ENCOUNTER — Ambulatory Visit: Payer: Self-pay

## 2015-07-14 ENCOUNTER — Encounter: Payer: Self-pay | Admitting: *Deleted

## 2015-07-14 ENCOUNTER — Ambulatory Visit
Admission: EM | Admit: 2015-07-14 | Discharge: 2015-07-14 | Disposition: A | Discharge status: Home / Self Care | Payer: BLUE CROSS/BLUE SHIELD | Attending: Family Medicine | Admitting: Family Medicine

## 2015-07-14 DIAGNOSIS — R1032 Left lower quadrant pain: Secondary | ICD-10-CM | POA: Diagnosis not present

## 2015-07-14 DIAGNOSIS — K529 Noninfective gastroenteritis and colitis, unspecified: Secondary | ICD-10-CM

## 2015-07-14 DIAGNOSIS — R10819 Abdominal tenderness, unspecified site: Secondary | ICD-10-CM

## 2015-07-14 LAB — CBC WITH DIFFERENTIAL/PLATELET
BASOS ABS: 0 10*3/uL (ref 0–0.1)
EOS ABS: 0.1 10*3/uL (ref 0–0.7)
Eosinophils Relative: 1 %
HEMATOCRIT: 42.3 % (ref 35.0–47.0)
HEMOGLOBIN: 14.4 g/dL (ref 12.0–16.0)
Lymphocytes Relative: 16 %
Lymphs Abs: 1.5 10*3/uL (ref 1.0–3.6)
MCH: 30.2 pg (ref 26.0–34.0)
MCHC: 34 g/dL (ref 32.0–36.0)
MCV: 88.7 fL (ref 80.0–100.0)
Monocytes Absolute: 0.5 10*3/uL (ref 0.2–0.9)
NEUTROS ABS: 7.2 10*3/uL — AB (ref 1.4–6.5)
Platelets: 255 10*3/uL (ref 150–440)
RBC: 4.76 MIL/uL (ref 3.80–5.20)
RDW: 13.5 % (ref 11.5–14.5)
WBC: 9.3 10*3/uL (ref 3.6–11.0)

## 2015-07-14 LAB — URINALYSIS COMPLETE WITH MICROSCOPIC (ARMC ONLY)
GLUCOSE, UA: NEGATIVE mg/dL
Hgb urine dipstick: NEGATIVE
Leukocytes, UA: NEGATIVE
Nitrite: NEGATIVE
PROTEIN: 30 mg/dL — AB
RBC / HPF: NONE SEEN RBC/hpf (ref 0–5)
Specific Gravity, Urine: 1.02 (ref 1.005–1.030)
pH: 7.5 (ref 5.0–8.0)

## 2015-07-14 LAB — BASIC METABOLIC PANEL
ANION GAP: 6 (ref 5–15)
BUN: 9 mg/dL (ref 6–20)
CALCIUM: 8.8 mg/dL — AB (ref 8.9–10.3)
CHLORIDE: 104 mmol/L (ref 101–111)
CO2: 26 mmol/L (ref 22–32)
CREATININE: 0.71 mg/dL (ref 0.44–1.00)
GFR calc non Af Amer: >60 mL/min (ref >60)
Glucose, Bld: 106 mg/dL — ABNORMAL HIGH (ref 65–99)
Potassium: 3.5 mmol/L (ref 3.5–5.1)
SODIUM: 136 mmol/L (ref 135–145)

## 2015-07-14 LAB — LIPASE, BLOOD: LIPASE: 14 U/L (ref 11–51)

## 2015-07-14 LAB — PREGNANCY, URINE: Preg Test, Ur: NEGATIVE

## 2015-07-14 LAB — AMYLASE: AMYLASE: 22 U/L — AB (ref 28–100)

## 2015-07-14 MED ORDER — ONDANSETRON 8 MG PO TBDP
8.0000 mg | ORAL_TABLET | Freq: Once | ORAL | Status: AC
  Administered 2015-07-14: 8 mg via ORAL

## 2015-07-14 MED ORDER — ONDANSETRON 8 MG PO TBDP: 8.0000 mg | ORAL_TABLET | Freq: Three times a day (TID) | ORAL | Status: DC | PRN

## 2015-07-14 MED ORDER — DIPHENOXYLATE-ATROPINE 2.5-0.025 MG PO TABS: 1.0000 | ORAL_TABLET | Freq: Three times a day (TID) | ORAL | Status: DC | PRN

## 2015-07-14 NOTE — ED Provider Notes (Addendum)
CSN: [id number]     Arrival date & time 07/14/15  1143 History   First MD Initiated Contact with Patient 07/14/15 1218     Chief Complaint  Patient presents with  . Nausea  . Emesis  . Diarrhea  . Generalized Body Aches   Patient reports having nausea and vomiting along with diarrhea. The diarrhea she states going on for over a week and then this morning she started having nausea and vomiting. 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 She states the diarrhea has been unrelenting for week despite using Imodium. His sister had unrelenting vomiting as well this morning is now 70 hemoglobin better but she's also had generalized body aches as well.  Past medical history she has a history of some concern of pathology glands being swollen or enlarged before but never had a definitive diagnosis due to lost to follow-up. She is on no chronic medications. She does not smoke. No known drug allergies. No pertinent family medical history pertaining to this visit today.    (Consider location/radiation/quality/duration/timing/severity/associated sxs/prior Treatment) Patient is a 30 y.o. female presenting with vomiting and diarrhea. The history is provided by the patient. No language interpreter was used.  Emesis Severity:  Severe Timing:  Intermittent Able to tolerate:  Liquids Progression:  Worsening Chronicity:  New Recent urination:  Increased Associated symptoms: abdominal pain and diarrhea   Diarrhea:    Quality:  Malodorous   Severity:  Moderate   Duration:  7 days   Timing:  Constant   Progression:  Worsening Risk factors: not pregnant now, no prior abdominal surgery, no sick contacts, no suspect food intake and no travel to endemic areas   Diarrhea Associated symptoms: abdominal pain and vomiting   Associated symptoms: no fever     Past Medical History  Diagnosis Date  . Thyroid disease   . UTI (urinary tract infection)    History reviewed. No pertinent past surgical history. History  reviewed. No pertinent family history. Social History  Substance Use Topics  . Smoking status: Never Smoker   . Smokeless tobacco: None  . Alcohol Use: No   OB History    No data available     Review of Systems  Constitutional: Positive for activity change. Negative for fever.  HENT: Negative for congestion and dental problem.   Gastrointestinal: Positive for nausea, vomiting, abdominal pain and diarrhea.  Genitourinary: Positive for dysuria and pelvic pain.  Neurological: Positive for dizziness.  All other systems reviewed and are negative.   Allergies  Review of patient's allergies indicates no known allergies.  Home Medications   Prior to Admission medications   Medication Sig Start Date End Date Taking? Authorizing Provider  levonorgestrel (MIRENA) 20 MCG/24HR IUD 1 each by Intrauterine route once.   Yes Historical Provider, MD  acyclovir (ZOVIRAX) 400 MG tablet Take 400 mg by mouth 5 (five) times daily.    Historical Provider, MD  diphenoxylate-atropine (LOMOTIL) 2.5-0.025 MG tablet Take 1 tablet by mouth 3 (three) times daily as needed for diarrhea or loose stools. 07/14/15   Hassan Rowan, MD  mupirocin ointment (BACTROBAN) 2 % Apply 1 application topically 3 (three) times daily. 04/05/15   Hassan Rowan, MD  nitrofurantoin, macrocrystal-monohydrate, (MACROBID) 100 MG capsule Take 1 capsule (100 mg total) by mouth 2 (two) times daily. 10/02/14   Eustace Moore, MD  ondansetron (ZOFRAN ODT) 8 MG disintegrating tablet Take 1 tablet (8 mg total) by mouth every 8 (eight) hours as needed for nausea or vomiting. 07/14/15  Hassan RowanEugene Anjolie Majer, MD  oxyCODONE-acetaminophen (PERCOCET) 5-325 MG tablet Take 1 tablet by mouth every 4 (four) hours as needed for severe pain. 12/24/14   Tommi Rumpshonda L Summers, PA-C  oxyCODONE-acetaminophen (PERCOCET/ROXICET) 5-325 MG tablet Take 1 tablet by mouth every 8 (eight) hours as needed for moderate pain or severe pain. 04/05/15   Hassan RowanEugene Mandy Fitzwater, MD  phenazopyridine (PYRIDIUM)  200 MG tablet Take 1 tablet (200 mg total) by mouth 3 (three) times daily. 10/02/14   Eustace MooreLaura W Murray, MD  sulfamethoxazole-trimethoprim (BACTRIM DS,SEPTRA DS) 800-160 MG tablet Take 1 tablet by mouth 2 (two) times daily. 12/24/14   Tommi Rumpshonda L Summers, PA-C  sulfamethoxazole-trimethoprim (BACTRIM DS,SEPTRA DS) 800-160 MG tablet Take 1 tablet by mouth 2 (two) times daily. 04/05/15   Hassan RowanEugene Dacoda Spallone, MD   Meds Ordered and Administered this Visit   Medications  ondansetron (ZOFRAN-ODT) disintegrating tablet 8 mg (8 mg Oral Given 07/14/15 1424)    BP 113/80 mmHg  Pulse 95  Temp(Src) 98.6 F (37 C) (Oral)  Resp 16  Ht 5\' 5"  (1.651 m)  Wt 180 lb (81.647 kg)  BMI 29.95 kg/m2  SpO2 100%  LMP 06/30/2015 (Exact Date) No data found.   Physical Exam  Constitutional: She is oriented to person, place, and time. She appears well-developed and well-nourished.  HENT:  Head: Normocephalic.  Eyes: Conjunctivae are normal. Pupils are equal, round, and reactive to light.  Neck: Normal range of motion. Neck supple.  Cardiovascular: Normal rate, regular rhythm and normal heart sounds.   Pulmonary/Chest: Effort normal and breath sounds normal.  Abdominal: Soft. Bowel sounds are normal. She exhibits distension. There is no hepatosplenomegaly. There is tenderness in the suprapubic area, left upper quadrant and left lower quadrant. There is no CVA tenderness. No hernia. Hernia confirmed negative in the ventral area.    Musculoskeletal: Normal range of motion. She exhibits no tenderness.  Neurological: She is alert and oriented to person, place, and time. No cranial nerve deficit.  Skin: Skin is warm and dry.  Psychiatric: She has a normal mood and affect.  Vitals reviewed.   ED Course  Procedures (including critical care time)  Labs Review Labs Reviewed  URINALYSIS COMPLETEWITH MICROSCOPIC ([facility] ONLY) - Abnormal; Notable for the following:    APPearance HAZY (*)    Bilirubin Urine TRACE (*)    Ketones, ur  TRACE (*)    Protein, ur 30 (*)    Bacteria, UA FEW (*)    Squamous Epithelial / LPF 6-30 (*)    All other components within normal limits  AMYLASE - Abnormal; Notable for the following:    Amylase 22 (*)    All other components within normal limits  CBC WITH DIFFERENTIAL/PLATELET - Abnormal; Notable for the following:    Neutro Abs 7.2 (*)    All other components within normal limits  BASIC METABOLIC PANEL - Abnormal; Notable for the following:    Glucose, Bld 106 (*)    Calcium 8.8 (*)    All other components within normal limits  GASTROINTESTINAL PANEL BY PCR, STOOL (REPLACES STOOL CULTURE)  URINE CULTURE  PREGNANCY, URINE  LIPASE, BLOOD    Imaging Review No results found.   Visual Acuity Review  Right Eye Distance:   Left Eye Distance:   Bilateral Distance:    Right Eye Near:   Left Eye Near:    Bilateral Near:      Results for orders placed or performed during the hospital encounter of 07/14/15  Urinalysis complete, with microscopic  Result  Value Ref Range   Color, Urine YELLOW YELLOW   APPearance HAZY (A) CLEAR   Glucose, UA NEGATIVE NEGATIVE mg/dL   Bilirubin Urine TRACE (A) NEGATIVE   Ketones, ur TRACE (A) NEGATIVE mg/dL   Specific Gravity, Urine 1.020 1.005 - 1.030   Hgb urine dipstick NEGATIVE NEGATIVE   pH 7.5 5.0 - 8.0   Protein, ur 30 (A) NEGATIVE mg/dL   Nitrite NEGATIVE NEGATIVE   Leukocytes, UA NEGATIVE NEGATIVE   RBC / HPF NONE SEEN 0 - 5 RBC/hpf   WBC, UA 0-5 0 - 5 WBC/hpf   Bacteria, UA FEW (A) NONE SEEN   Squamous Epithelial / LPF 6-30 (A) NONE SEEN   Mucous PRESENT   Pregnancy, urine  Result Value Ref Range   Preg Test, Ur NEGATIVE NEGATIVE  Amylase  Result Value Ref Range   Amylase 22 (L) 28 - 100 U/L  Lipase, blood  Result Value Ref Range   Lipase 14 11 - 51 U/L  CBC with Differential  Result Value Ref Range   WBC 9.3 3.6 - 11.0 K/uL   RBC 4.76 3.80 - 5.20 MIL/uL   Hemoglobin 14.4 12.0 - 16.0 g/dL   HCT 16.1 09.6 - 04.5 %    MCV 88.7 80.0 - 100.0 fL   MCH 30.2 26.0 - 34.0 pg   MCHC 34.0 32.0 - 36.0 g/dL   RDW 40.9 81.1 - 91.4 %   Platelets 255 150 - 440 K/uL   Neutrophils Relative % 78% %   Neutro Abs 7.2 (H) 1.4 - 6.5 K/uL   Lymphocytes Relative 16% %   Lymphs Abs 1.5 1.0 - 3.6 K/uL   Monocytes Relative 5% %   Monocytes Absolute 0.5 0.2 - 0.9 K/uL   Eosinophils Relative 1% %   Eosinophils Absolute 0.1 0 - 0.7 K/uL   Basophils Relative 0% %   Basophils Absolute 0.0 0 - 0.1 K/uL  Basic metabolic panel  Result Value Ref Range   Sodium 136 135 - 145 mmol/L   Potassium 3.5 3.5 - 5.1 mmol/L   Chloride 104 101 - 111 mmol/L   CO2 26 22 - 32 mmol/L   Glucose, Bld 106 (H) 65 - 99 mg/dL   BUN 9 6 - 20 mg/dL   Creatinine, Ser 7.82 0.44 - 1.00 mg/dL   Calcium 8.8 (L) 8.9 - 10.3 mg/dL   GFR calc non Af Amer >60 >60 mL/min   GFR calc Af Amer >60 >60 mL/min   Anion gap 6 5 - 15     MDM   1. Gastroenteritis, acute   2. Left lower quadrant pain   3. Suprapubic tenderness    Still awaiting stool specimen for GI test as we do not have a stool specimen over 3 hours time while here was doing her home with this container. Will treat with Zofran for nausea milligrams sublingual urinalysis unremarkable will obtain urine culture will place her on Lomotil for diarrhea 6 to help control the diarrhea work note for today and tomorrow. Follow-up PCP if not better in a week.    Note: This dictation was prepared with Dragon dictation along with smaller phrase technology. Any transcriptional errors that result from this process are unintentional.    Hassan Rowan, MD 07/14/15 1524  Hassan Rowan, MD 07/14/15 1527

## 2015-07-14 NOTE — ED Notes (Signed)
N/V/D, and body aches x6 days.

## 2015-07-14 NOTE — Discharge Instructions (Signed)
Abdominal Pain, Adult °Many things can cause belly (abdominal) pain. Most times, the belly pain is not dangerous. Many cases of belly pain can be watched and treated at home. °HOME CARE  °· Do not take medicines that help you go poop (laxatives) unless told to by your doctor. °· Only take medicine as told by your doctor. °· Eat or drink as told by your doctor. Your doctor will tell you if you should be on a special diet. °GET HELP IF: °· You do not know what is causing your belly pain. °· You have belly pain while you are sick to your stomach (nauseous) or have runny poop (diarrhea). °· You have pain while you pee or poop. °· Your belly pain wakes you up at night. °· You have belly pain that gets worse or better when you eat. °· You have belly pain that gets worse when you eat fatty foods. °· You have a fever. °GET HELP RIGHT AWAY IF:  °· The pain does not go away within 2 hours. °· You keep throwing up (vomiting). °· The pain changes and is only in the right or left part of the belly. °· You have bloody or tarry looking poop. °MAKE SURE YOU:  °· Understand these instructions. °· Will watch your condition. °· Will get help right away if you are not doing well or get worse. °  °This information is not intended to replace advice given to you by your health care provider. Make sure you discuss any questions you have with your health care provider. °  °Document Released: 07/04/2007 Document Revised: 02/05/2014 Document Reviewed: 09/24/2012 °Elsevier Interactive Patient Education ©2016 [employer] ° °Diarrhea °Diarrhea is watery poop (stool). It can make you feel weak, tired, thirsty, or give you a dry mouth (signs of dehydration). Watery poop is a sign of another problem, most often an infection. It often lasts 2-3 days. It can last longer if it is a sign of something serious. Take care of yourself as told by your doctor. °HOME CARE  °· Drink 1 cup (8 ounces) of fluid each time you have watery poop. °· Do not drink  the following fluids: °¨ Those that contain simple sugars (fructose, glucose, galactose, lactose, sucrose, maltose). °¨ Sports drinks. °¨ Fruit juices. °¨ Whole milk products. °¨ Sodas. °¨ Drinks with caffeine (coffee, tea, soda) or alcohol. °· Oral rehydration solution may be used if the doctor says it is okay. You may make your own solution. Follow this recipe: °¨  - teaspoon table salt. °¨ ¾ teaspoon baking soda. °¨  teaspoon salt substitute containing potassium chloride. °¨ 1 tablespoons sugar. °¨ 1 liter (34 ounces) of water. °· Avoid the following foods: °¨ High fiber foods, such as raw fruits and vegetables. °¨ Nuts, seeds, and whole grain breads and cereals. °¨  Those that are sweetened with sugar alcohols (xylitol, sorbitol, mannitol). °· Try eating the following foods: °¨ Starchy foods, such as rice, toast, pasta, low-sugar cereal, oatmeal, baked potatoes, crackers, and bagels. °¨ Bananas. °¨ Applesauce. °· Eat probiotic-rich foods, such as yogurt and milk products that are fermented. °· Wash your hands well after each time you have watery poop. °· Only take medicine as told by your doctor. °· Take a warm bath to help lessen burning or pain from having watery poop. °GET HELP RIGHT AWAY IF:  °· You cannot drink fluids without throwing up (vomiting). °· You keep throwing up. °· You have blood in your poop, or your poop looks   black and tarry. °· You do not pee (urinate) in 6-8 hours, or there is only a small amount of very dark pee. °· You have belly (abdominal) pain that gets worse or stays in the same spot (localizes). °· You are weak, dizzy, confused, or light-headed. °· You have a very bad headache. °· Your watery poop gets worse or does not get better. °· You have a fever or lasting symptoms for more than 2-3 days. °· You have a fever and your symptoms suddenly get worse. °MAKE SURE YOU:  °· Understand these instructions. °· Will watch your condition. °· Will get help right away if you are not doing well  or get worse. °  °This information is not intended to replace advice given to you by your health care provider. Make sure you discuss any questions you have with your health care provider. °  °Document Released: 07/04/2007 Document Revised: 02/05/2014 Document Reviewed: 09/23/2011 °Elsevier Interactive Patient Education ©2016 [employer] Inc[employer] ° °

## 2015-07-15 ENCOUNTER — Other Ambulatory Visit
Admission: RE | Admit: 2015-07-15 | Discharge: 2015-07-15 | Disposition: A | Discharge status: Home / Self Care | Payer: BLUE CROSS/BLUE SHIELD | Source: Ambulatory Visit | Attending: Family Medicine | Admitting: Family Medicine

## 2015-07-15 DIAGNOSIS — R197 Diarrhea, unspecified: Secondary | ICD-10-CM | POA: Insufficient documentation

## 2015-07-16 ENCOUNTER — Telehealth: Payer: Self-pay

## 2015-07-16 LAB — GASTROINTESTINAL PANEL BY PCR, STOOL (REPLACES STOOL CULTURE)
ADENOVIRUS F40/41: NOT DETECTED
ASTROVIRUS: NOT DETECTED
CRYPTOSPORIDIUM: NOT DETECTED
CYCLOSPORA CAYETANENSIS: NOT DETECTED
Campylobacter species: NOT DETECTED
E. coli O157: NOT DETECTED
ENTAMOEBA HISTOLYTICA: NOT DETECTED
ENTEROPATHOGENIC E COLI (EPEC): NOT DETECTED
ENTEROTOXIGENIC E COLI (ETEC): NOT DETECTED
Enteroaggregative E coli (EAEC): NOT DETECTED
GIARDIA LAMBLIA: NOT DETECTED
Norovirus GI/GII: DETECTED — AB
Plesimonas shigelloides: NOT DETECTED
Rotavirus A: NOT DETECTED
Salmonella species: NOT DETECTED
Sapovirus (I, II, IV, and V): NOT DETECTED
Shiga like toxin producing E coli (STEC): NOT DETECTED
Shigella/Enteroinvasive E coli (EIEC): NOT DETECTED
VIBRIO CHOLERAE: NOT DETECTED
VIBRIO SPECIES: NOT DETECTED
YERSINIA ENTEROCOLITICA: NOT DETECTED

## 2015-07-16 LAB — URINE CULTURE: SPECIAL REQUESTS: NORMAL

## 2015-07-16 NOTE — ED Notes (Signed)
Received call from lab regarding positive Norovirus result. I spoke with Dr. Allena KatzPatel regarding the results and called the patient. She is feeling much better today. Instructed this is very contagious and to stay out of public until better. Instructed to stay well hydrated and use her anti-emetic med as needed. F/u with PCP if needed.

## 2015-07-21 ENCOUNTER — Telehealth: Payer: Self-pay | Admitting: Emergency Medicine

## 2015-07-21 NOTE — ED Notes (Signed)
Patient returned our phone call and states that she is feeling much better and that her symptoms have resolved.

## 2015-10-12 DIAGNOSIS — D224 Melanocytic nevi of scalp and neck: Secondary | ICD-10-CM | POA: Diagnosis not present

## 2015-10-12 DIAGNOSIS — D1801 Hemangioma of skin and subcutaneous tissue: Secondary | ICD-10-CM | POA: Diagnosis not present

## 2016-01-05 IMAGING — CR DG ABDOMEN 1V
1 series · 2 of 2 positions shown · non-contrast
Comparison: Ultrasound pelvis of [DATE]

CLINICAL DATA: Evaluate IUD placement with IUD not visualized on
ultrasound

EXAM:
ABDOMEN - 1 VIEW

[Series 1: dg abd 1 view · 0.14mm/px · 2 of 2 slices shown]
[im 1/2]
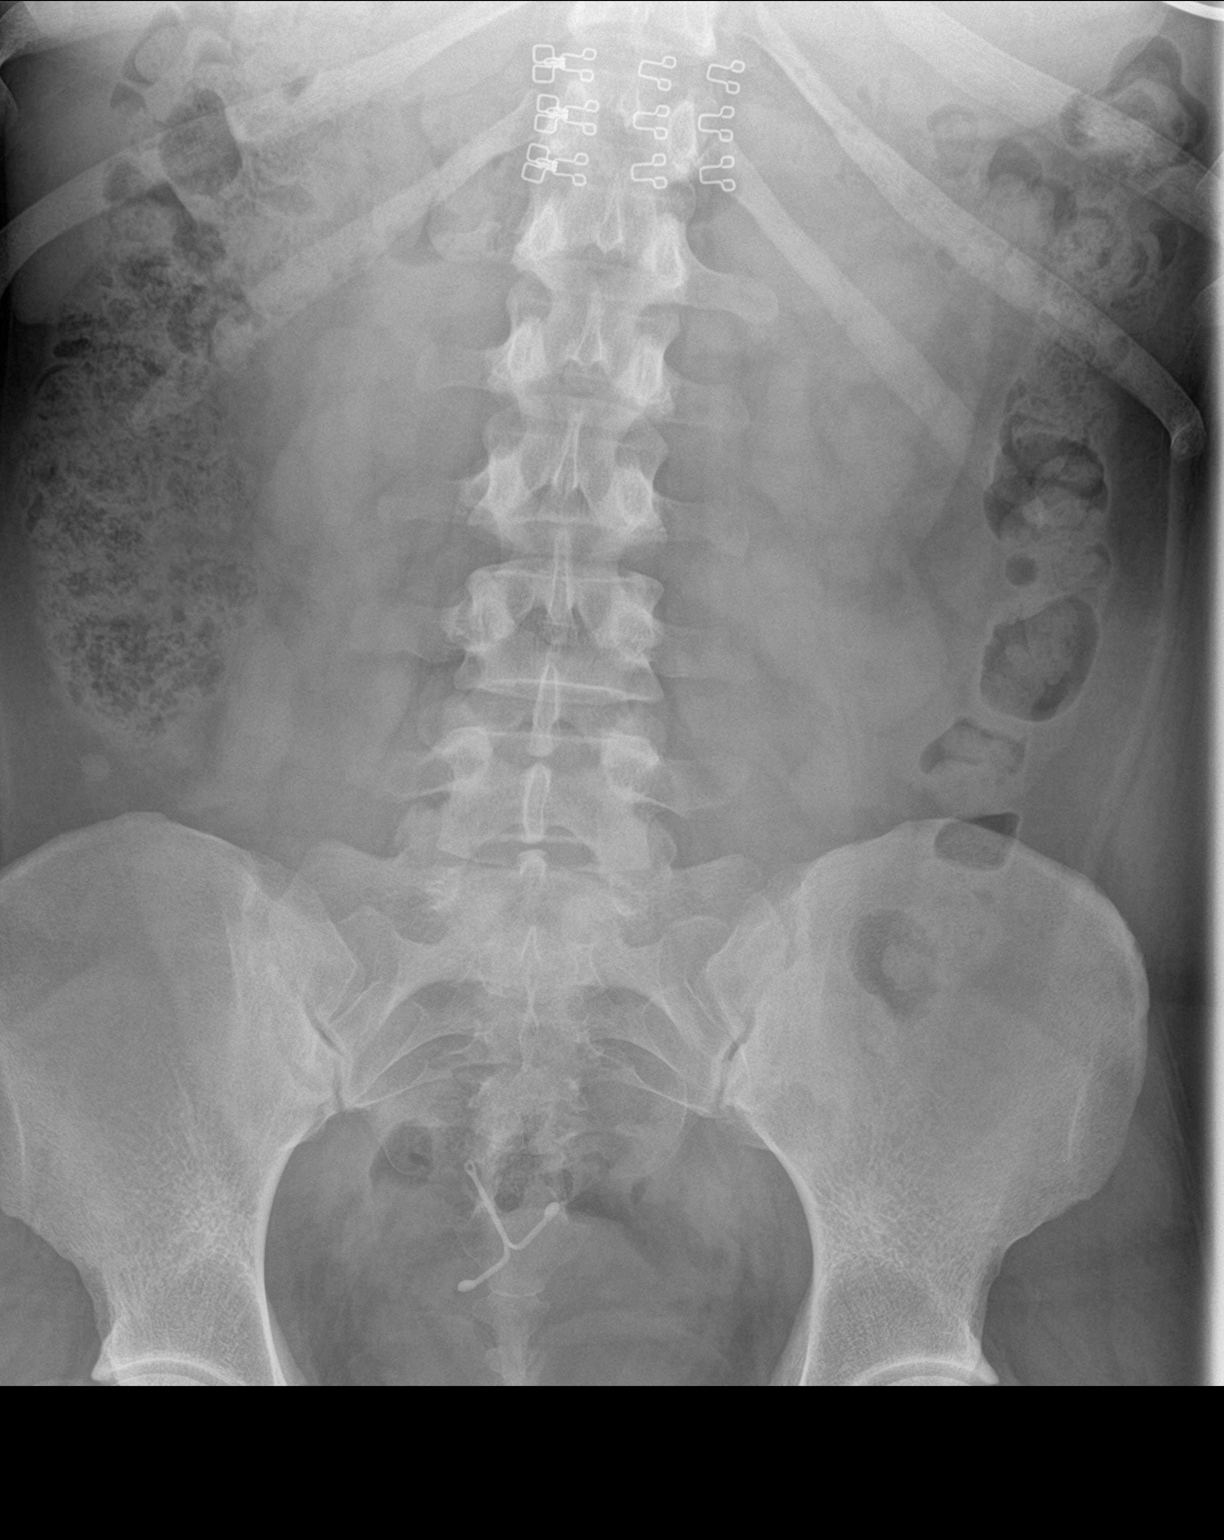
[im 2/2]
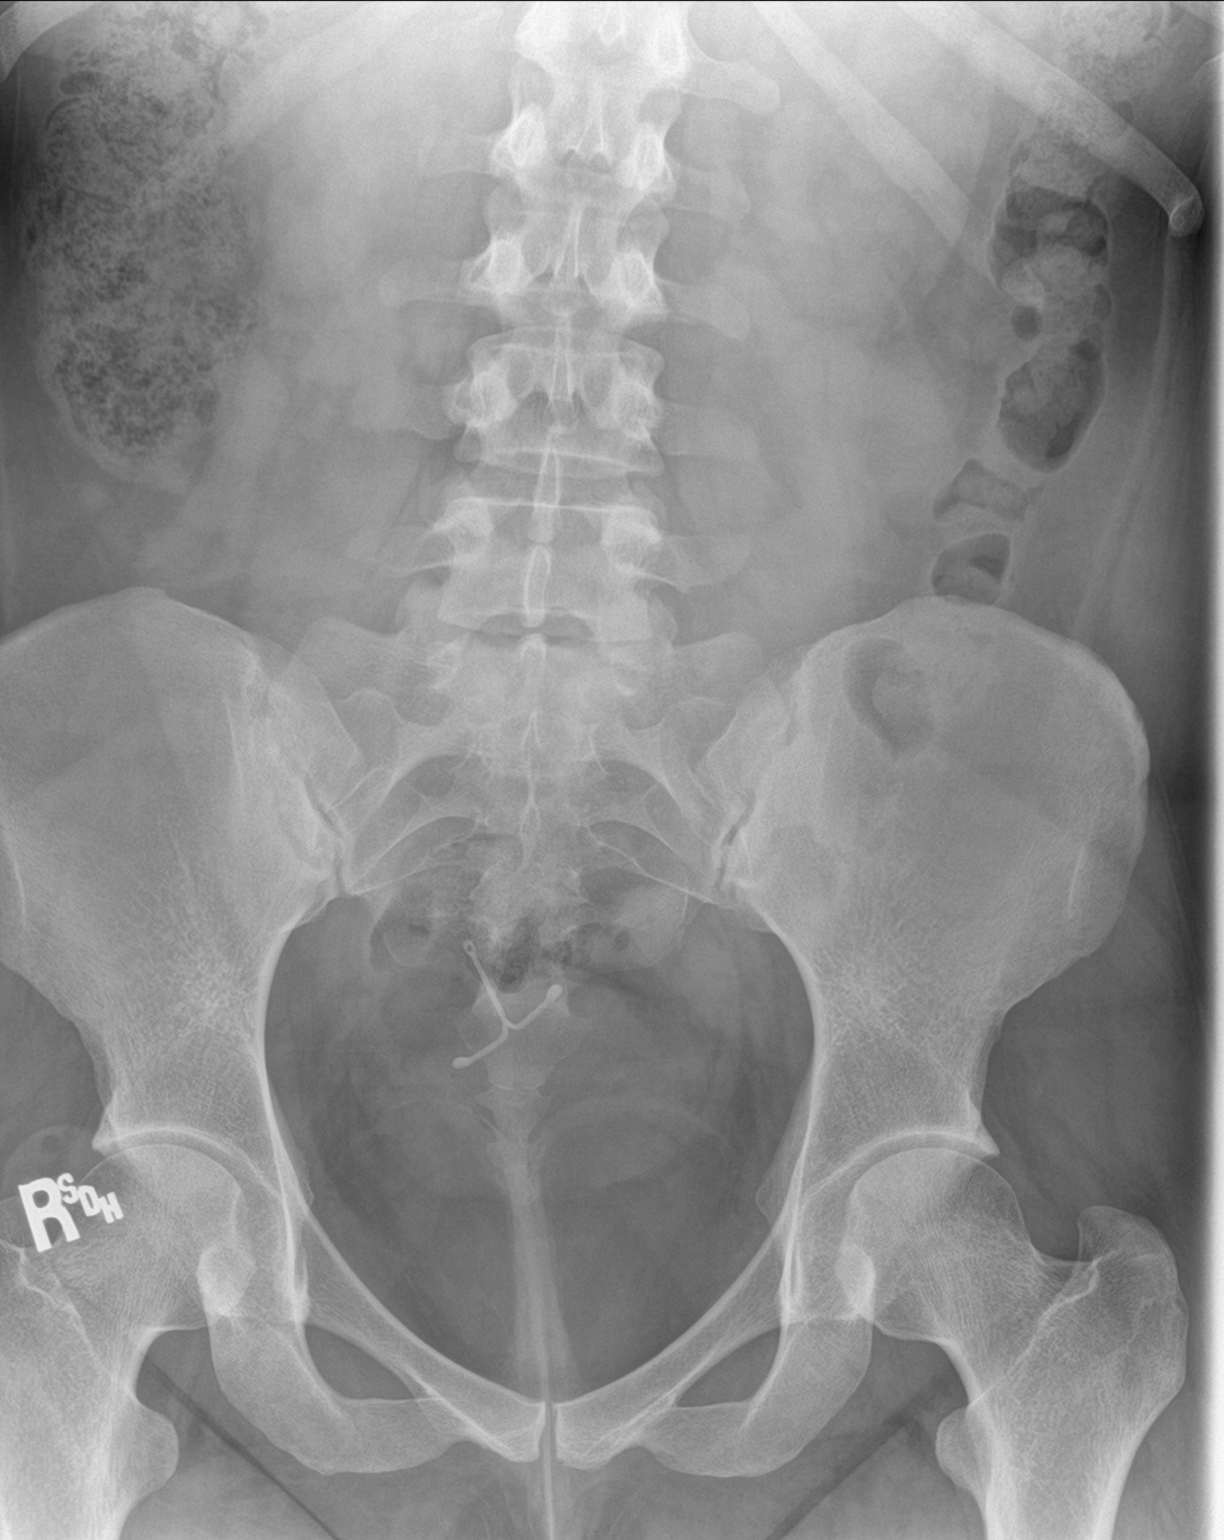

[2 of 2 positions shown; findings below may reference images not displayed]

FINDINGS: An IUD is noted in the mid upper pelvis minimally to the right of
midline. The bowel gas pattern is nonspecific. No opaque calculi are
seen. No bony abnormality is noted.
IMPRESSION: IUD overlies the midline of the mid upper pelvis minimally to the
right of midline.

## 2016-01-30 HISTORY — PX: INTRAUTERINE DEVICE (IUD) INSERTION: SHX5877

## 2016-02-18 ENCOUNTER — Encounter: Payer: Self-pay | Admitting: Emergency Medicine

## 2016-02-18 ENCOUNTER — Ambulatory Visit
Admission: EM | Admit: 2016-02-18 | Discharge: 2016-02-18 | Disposition: A | Discharge status: Home / Self Care | Payer: BLUE CROSS/BLUE SHIELD | Attending: Family Medicine | Admitting: Family Medicine

## 2016-02-18 DIAGNOSIS — H1031 Unspecified acute conjunctivitis, right eye: Secondary | ICD-10-CM

## 2016-02-18 DIAGNOSIS — J111 Influenza due to unidentified influenza virus with other respiratory manifestations: Secondary | ICD-10-CM | POA: Diagnosis not present

## 2016-02-18 LAB — RAPID INFLUENZA A&B ANTIGENS: Influenza A (ARMC): POSITIVE — AB

## 2016-02-18 LAB — RAPID INFLUENZA A&B ANTIGENS (ARMC ONLY): INFLUENZA B (ARMC): NEGATIVE

## 2016-02-18 MED ORDER — OSELTAMIVIR PHOSPHATE 75 MG PO CAPS: 75.0000 mg | ORAL_CAPSULE | Freq: Two times a day (BID) | ORAL | 0 refills | Status: DC

## 2016-02-18 MED ORDER — OFLOXACIN 0.3 % OP SOLN: 1.0000 [drp] | Freq: Four times a day (QID) | OPHTHALMIC | 0 refills | Status: DC

## 2016-02-18 NOTE — ED Provider Notes (Signed)
CSN: [id number]     Arrival date & time 02/18/16  [id number] History   First MD Initiated Contact with Patient 02/18/16 1136     Chief Complaint  Patient presents with  . Generalized Body Aches  . Nasal Congestion   (Consider location/radiation/quality/duration/timing/severity/associated sxs/prior Treatment) HPI: Pt c/o fever/chills and generalized body aches, headaches  x 2 days. Mild non productive cough. Denies SOB. Also C/O right eye redness with crusting that started yesterday. Denies pain in eye, change in vison or blurry vision.  Past Medical History:  Diagnosis Date  . Thyroid disease   . UTI (urinary tract infection)    History reviewed. No pertinent surgical history. History reviewed. No pertinent family history. Social History  Substance Use Topics  . Smoking status: Never Smoker  . Smokeless tobacco: Never Used  . Alcohol use No   OB History    No data available     Review of Systems  Constitutional: Positive for chills, fatigue and fever.  HENT: Positive for congestion.   Eyes: Positive for discharge and redness.  Respiratory: Positive for cough.   Musculoskeletal: Positive for myalgias.    Allergies  Patient has no known allergies.  Home Medications   Prior to Admission medications   Medication Sig Start Date End Date Taking? Authorizing Provider  levonorgestrel (MIRENA) 20 MCG/24HR IUD 1 each by Intrauterine route once.    Historical Provider, MD  ofloxacin (OCUFLOX) 0.3 % ophthalmic solution Place 1 drop into the right eye 4 (four) times daily. 02/18/16   Sheppard Luckenbach, NP  oseltamivir (TAMIFLU) 75 MG capsule Take 1 capsule (75 mg total) by mouth every 12 (twelve) hours. 02/18/16   Ziva Nunziata, NP   Meds Ordered and Administered this Visit  Medications - No data to display  BP 124/79 (BP Location: Right Arm)   Pulse 94   Temp 98.6 F (37 C) (Oral)   Resp 16   Ht 5\' 5"  (1.651 m)   Wt 180 lb (81.6 kg)   LMP 02/02/2016 (Exact Date)   SpO2 99%    BMI 29.95 kg/m  No data found.   Physical Exam  Constitutional: She appears well-developed and well-nourished.  Eyes: Pupils are equal, round, and reactive to light. Right eye exhibits discharge. Left eye exhibits no discharge. No scleral icterus.  Pulmonary/Chest: Effort normal and breath sounds normal. No respiratory distress. She has no wheezes. She has no rales.  Skin: Skin is warm.  Mild Right conjunctival erythema. No drainage appreciated.  Urgent Care Course     Procedures (including critical care time)  Labs Review Labs Reviewed  RAPID INFLUENZA A&B ANTIGENS ([facility] ONLY) - Abnormal; Notable for the following:       Result Value   Influenza A ([facility]) POSITIVE (*)    All other components within normal limits    Imaging Review No results found.   Visual Acuity Review  Right Eye Distance:   Left Eye Distance:   Bilateral Distance:    Right Eye Near:   Left Eye Near:    Bilateral Near:         MDM   1. Influenza   2. Acute bacterial conjunctivitis of right eye   Influenza A positive.Within time frame to Initiate Tx with Tamiflu. Tylenol/Motrin for pain and fever. Increase rest and hydration. Educated on Special educational needs teacherhand hygiene.    Ashlynn Gunnels, NP 02/18/16 1152    Lamin Chandley, NP 02/18/16 1153

## 2016-02-18 NOTE — ED Triage Notes (Signed)
Patient c/o nausea, runny nose, HAs, and bodyaches that started Thursday.  Patient reports low grade fevers.

## 2016-03-28 ENCOUNTER — Encounter: Payer: Self-pay | Admitting: Obstetrics and Gynecology

## 2016-03-28 ENCOUNTER — Ambulatory Visit (INDEPENDENT_AMBULATORY_CARE_PROVIDER_SITE_OTHER): Payer: BLUE CROSS/BLUE SHIELD | Admitting: Obstetrics and Gynecology

## 2016-03-28 VITALS — BP 118/72 | Wt 185.0 lb

## 2016-03-28 DIAGNOSIS — Z30431 Encounter for routine checking of intrauterine contraceptive device: Secondary | ICD-10-CM

## 2016-03-28 DIAGNOSIS — Z113 Encounter for screening for infections with a predominantly sexual mode of transmission: Secondary | ICD-10-CM | POA: Diagnosis not present

## 2016-03-28 NOTE — Progress Notes (Signed)
     IUD String Check  Subjctive: Ms. Kari CooleySamantha Frost presents for IUD string check.  She had a Mirena placed 3 years ago.  Since placement of her IUD she had regular, monthly vaginal bleeding.  She reports intermittent sharp, cramping cramping or discomfort.  She has had intercourse since placement.  She has not checked the strings.  She denies any fever, chills, nausea, vomiting, or other complaints.    Past Medical History:  Diagnosis Date  . Thyroid disease   . UTI (urinary tract infection)     Past Surgical History:  Procedure Laterality Date  . INTRAUTERINE DEVICE (IUD) INSERTION  2018   Current Outpatient Prescriptions on File Prior to Visit  Medication Sig Dispense Refill  . levonorgestrel (MIRENA) 20 MCG/24HR IUD 1 each by Intrauterine route once.     No current facility-administered medications on file prior to visit.      Allergies: No Known Allergies   Social History  Substance Use Topics  . Smoking status: Never Smoker  . Smokeless tobacco: Never Used  . Alcohol use No    Family History: denies history of GYN cancers  Review of Systems  Constitutional: Negative.   HENT: Negative.   Eyes: Negative.   Respiratory: Negative.   Cardiovascular: Negative.   Gastrointestinal: Positive for abdominal pain.  Genitourinary: Negative.   Musculoskeletal: Negative.   Skin: Negative.   Neurological: Negative.     Objective: BP 118/72   Wt 185 lb (83.9 kg)   LMP 03/01/2016   BMI 30.79 kg/m  Physical Exam  Constitutional: She is oriented to person, place, and time. She appears well-developed and well-nourished. No distress.  HENT:  Head: Normocephalic and atraumatic.  Nose: Nose normal.  Eyes: Conjunctivae are normal.  Neck: Normal range of motion.  Cardiovascular: Normal rate, regular rhythm and normal heart sounds.   Pulmonary/Chest: Effort normal and breath sounds normal.  Abdominal: Soft. She exhibits no distension. There is no tenderness. There is no  rebound and no guarding.  Genitourinary: Uterus normal. Pelvic exam was performed with patient supine. There is no lesion on the right labia. There is no lesion on the left labia. Uterus is not tender. Cervix exhibits no motion tenderness and no discharge. Right adnexum displays no mass. Left adnexum displays no mass. No vaginal discharge found.  Genitourinary Comments: IUD strings NOT seen  Musculoskeletal: Normal range of motion.  Lymphadenopathy:       Right: No inguinal adenopathy present.       Left: No inguinal adenopathy present.  Neurological: She is alert and oriented to person, place, and time.  Skin: Skin is warm and dry. No rash noted.  Psychiatric: She has a normal mood and affect. Her behavior is normal.    Female chaperone was present for the entirety of the pelvic exam  Assessment: 31 y.o. year old female status post prior Mirena IUD placement 3 years ago, doing well. No evidence of IUD strings.  Plan: * schedule pelvic ultrasound to verify IUD location, follo up after as soon as is convenient for patient.  * gonorrhea/chlamydia ordered today with recent diagnosis of HSV. * patient to schedule routine annual exam  Thomasene MohairStephen Jackson, MD 03/28/2016 1:53 PM

## 2016-03-30 LAB — GC/CHLAMYDIA PROBE AMP
CHLAMYDIA, DNA PROBE: NEGATIVE
NEISSERIA GONORRHOEAE BY PCR: NEGATIVE

## 2016-04-10 ENCOUNTER — Ambulatory Visit (INDEPENDENT_AMBULATORY_CARE_PROVIDER_SITE_OTHER): Payer: BLUE CROSS/BLUE SHIELD | Admitting: Obstetrics and Gynecology

## 2016-04-10 ENCOUNTER — Encounter: Payer: Self-pay | Admitting: Obstetrics and Gynecology

## 2016-04-10 ENCOUNTER — Ambulatory Visit (INDEPENDENT_AMBULATORY_CARE_PROVIDER_SITE_OTHER): Payer: BLUE CROSS/BLUE SHIELD

## 2016-04-10 VITALS — BP 108/68 | HR 94 | Ht 65.0 in | Wt 185.0 lb

## 2016-04-10 DIAGNOSIS — T8332XD Displacement of intrauterine contraceptive device, subsequent encounter: Secondary | ICD-10-CM | POA: Diagnosis not present

## 2016-04-10 DIAGNOSIS — Z30431 Encounter for routine checking of intrauterine contraceptive device: Secondary | ICD-10-CM

## 2016-04-10 DIAGNOSIS — T8332XA Displacement of intrauterine contraceptive device, initial encounter: Secondary | ICD-10-CM | POA: Insufficient documentation

## 2016-04-10 NOTE — Progress Notes (Signed)
    Gynecology Ultrasound Follow Up   Chief Complaint  Patient presents with  . U/S follow up Verify iud in place   History of Present Illness: Patient is a 31 y.o. female who presents today for ultrasound evaluation of location of her IUD as they were not found on an exam recently when she came in to make sure everything was ok.  Ultrasound demonstrates the following findgins Normal pelvic u/s. IUD NOT VISUALIZED  Review of Systems: Review of Systems  Constitutional: Negative.   HENT: Negative.   Eyes: Negative.   Respiratory: Negative.   Cardiovascular: Negative.   Gastrointestinal: Positive for abdominal pain.  Genitourinary: Negative.   Musculoskeletal: Negative.   Skin: Negative.   Neurological: Negative.   Psychiatric/Behavioral: Negative.    Past Medical History:  Diagnosis Date  . Thyroid disease   . UTI (urinary tract infection)    Past Surgical History:  Procedure Laterality Date  . INTRAUTERINE DEVICE (IUD) INSERTION  2018    Gynecologic History:  No LMP recorded. Patient is not currently having periods (Reason: IUD).  Family History: no GYN cancer  Social History   Social History  . Marital status: Single    Spouse name: N/A  . Number of children: N/A  . Years of education: N/A   Occupational History  . Not on file.   Social History Main Topics  . Smoking status: Never Smoker  . Smokeless tobacco: Never Used  . Alcohol use No  . Drug use: No  . Sexual activity: Yes    Birth control/ protection: IUD   Other Topics Concern  . Not on file   Social History Narrative  . No narrative on file    Allergies: No Known Allergies  Medications:   Medication Sig Start Date End Date Taking? Authorizing Provider  levonorgestrel (MIRENA) 20 MCG/24HR IUD 1 each by Intrauterine route once.    Historical Provider, MD    Physical Exam Vitals: Blood pressure 108/68, pulse 94, height 5\' 5"  (1.651 m), weight 185 lb (83.9 kg).  General: NAD HEENT:  normocephalic, anicteric Neurologic: Grossly intact, normal gait Psychiatric: mood appropriate, affect full   Assessment: 31 y.o. with no IUD strings seen on pelvic u/s.  Plan: 1. Displacement of intrauterine contraceptive device, subsequent encounter - abdominal xray, asap.   F/u based on xray results.  15 minutes spent in face to face discussion with > 50% spent in counseling and management of her IUD that can not be found.   Thomasene MohairStephen Tekla Malachowski, MD 04/10/2016 4:38 PM

## 2016-04-11 ENCOUNTER — Telehealth: Payer: Self-pay | Admitting: Obstetrics and Gynecology

## 2016-04-11 ENCOUNTER — Ambulatory Visit
Admission: RE | Admit: 2016-04-11 | Discharge: 2016-04-11 | Disposition: A | Discharge status: Home / Self Care | Payer: BLUE CROSS/BLUE SHIELD | Source: Ambulatory Visit | Attending: Obstetrics and Gynecology | Admitting: Obstetrics and Gynecology

## 2016-04-11 DIAGNOSIS — T8332XD Displacement of intrauterine contraceptive device, subsequent encounter: Secondary | ICD-10-CM

## 2016-04-11 DIAGNOSIS — X58XXXD Exposure to other specified factors, subsequent encounter: Secondary | ICD-10-CM | POA: Diagnosis not present

## 2016-04-11 DIAGNOSIS — Z452 Encounter for adjustment and management of vascular access device: Secondary | ICD-10-CM | POA: Diagnosis not present

## 2016-04-11 NOTE — Telephone Encounter (Signed)
Patient is aware she can go anytime for the xray. [facility]

## 2016-04-12 ENCOUNTER — Telehealth: Payer: Self-pay | Admitting: Obstetrics and Gynecology

## 2016-04-12 NOTE — Telephone Encounter (Signed)
Pt is calling today stating she spoke with Dr Jean Rosenthaljackson about being brought in Asap due to problem with her  mirena. Dr. Jean RosenthalJackson out off the office this after noon. Please advise over booking for Friday March 16th. CB# [phone]

## 2016-04-13 NOTE — Telephone Encounter (Signed)
SDJ I am not sure why this was sent to Clinical pool. Please advise on this patient. Thanks

## 2016-04-14 NOTE — Telephone Encounter (Signed)
Yes. Please get her in ASAP with me. May over/double-book. Thank you

## 2016-04-16 NOTE — Telephone Encounter (Signed)
Pt is schedule 04/19/16 at 11:40

## 2016-04-16 NOTE — Telephone Encounter (Signed)
Please schedule with SDJ asap. Thank you!

## 2016-04-19 ENCOUNTER — Encounter: Payer: Self-pay | Admitting: Obstetrics and Gynecology

## 2016-04-19 ENCOUNTER — Ambulatory Visit (INDEPENDENT_AMBULATORY_CARE_PROVIDER_SITE_OTHER): Payer: BLUE CROSS/BLUE SHIELD | Admitting: Obstetrics and Gynecology

## 2016-04-19 VITALS — BP 114/70 | Ht 65.0 in | Wt 184.0 lb

## 2016-04-19 DIAGNOSIS — T8332XD Displacement of intrauterine contraceptive device, subsequent encounter: Secondary | ICD-10-CM | POA: Diagnosis not present

## 2016-04-19 NOTE — Progress Notes (Signed)
    Gynecology Ultrasound Follow Up  Chief Complaint: Follow up for IUD location.     History of Present Illness: Patient is a 31 y.o. female who presents today for ultrasound evaluation of IUD location.  She presented on 03/28/16 to make sure her IUD was in place. No IUD strings were found. An ultrasound was performed on 04/10/16 .  On 04/11/16, she had an abdominal x-ray which demonstrated an IUD in the pelvis, minimally to the right of midline.  She was asked to return for a transabdominal ultrasound to see if the uterus could be visualized or the IUD visualized in or outside the uterus.   Transabdominal, limited ultrasound performed: Uterus, anteverted. No IUD visualized within the uterus. Ovaries, not visualized. Hyperechoic area surrounding bowel immediately adjacent to uterus.  Are suspicious for IUD with hyperechoic structure, shadowing and reverberation effect.   Review of Systems: ROS - minimal abdominal pain.  Past Medical History:  Diagnosis Date  . Thyroid disease   . UTI (urinary tract infection)     Past Surgical History:  Procedure Laterality Date  . INTRAUTERINE DEVICE (IUD) INSERTION  2018    Gynecologic History:  No LMP recorded. Patient is not currently having periods (Reason: IUD).  Family History: patient reports no gynecologic cancers in her family.  Social History   Social History  . Marital status: Single    Spouse name: N/A  . Number of children: N/A  . Years of education: N/A   Occupational History  . Not on file.   Social History Main Topics  . Smoking status: Never Smoker  . Smokeless tobacco: Never Used  . Alcohol use No  . Drug use: No  . Sexual activity: Yes    Birth control/ protection: IUD   Other Topics Concern  . Not on file   Social History Narrative  . No narrative on file    Allergies: No Known Allergies  Medications:   Medication Sig Start Date End Date Taking? Authorizing Provider  levonorgestrel (MIRENA) 20 MCG/24HR  IUD 1 each by Intrauterine route once.    Historical Provider, MD    Physical Exam BP 114/70   Ht 5\' 5"  (1.651 m)   Wt 184 lb (83.5 kg)   BMI 30.62 kg/m   General: NAD HEENT: normocephalic, anicteric Pulmonary: No increased work of breathing CV: RRR Extremities: no edema, erythema, or tenderness Abdomen: soft, nontender, nondistended. Neurologic: Grossly intact, normal gait Psychiatric: mood appropriate, affect full   Assessment: 31 y.o. with displacement of intrauterine device.  Plan: Will take patient to OR for operative laparoscopy and removal of IUD (intrauterine device).  Discussed risk that the imaging could be wrong and that intrauterine device could be in uterus.  If no evidence of intrauterine device on laparoscopy, will perform hysteroscopy.  Discussed that I could perform hysteroscopy first to be 100% sure that the intrauterine device is not in the uterus, then proceed with laparoscpy.  However, given that the transvaginal imaging (upon further review) has some images suggesting that the intrauterine device is extrauterine, that the KUB showed an intrauterine device in the pelvis, and that the transabdominal ultrasound is also suggestive that the device is extrauterine, a mutual decision has been made to remove the device laparoscopically and she would like another intrauterine device placed during the surgery with laparoscopic assurance that the intrauterine device has been placed successfully.  Will schedule for as soon as possible. Consents signed today.  Thomasene MohairStephen Treonna Klee, MD 04/19/2016 1:38 PM

## 2016-04-24 ENCOUNTER — Telehealth: Payer: Self-pay | Admitting: Obstetrics and Gynecology

## 2016-04-24 ENCOUNTER — Encounter
Admission: RE | Admit: 2016-04-24 | Discharge: 2016-04-24 | Disposition: A | Discharge status: Home / Self Care | Payer: BLUE CROSS/BLUE SHIELD | Source: Ambulatory Visit | Attending: Obstetrics and Gynecology | Admitting: Obstetrics and Gynecology

## 2016-04-24 NOTE — Telephone Encounter (Signed)
Patient is aware of [employer] alert note showing her paid through date of 03/28/16 and that she is in the first month of the grace period. Patient read the Subscriber ID# from her new card and I confirmed this is the correct policy. Patient said she would call [employer].

## 2016-04-24 NOTE — Patient Instructions (Signed)
  Your procedure is scheduled on: 04-26-16 Report to Same Day Surgery 2nd floor medical mall [facility](Medical Mall Entrance-take elevator on left to 2nd floor.  Check in with surgery information desk.) To find out your arrival time please call [phone] between 1PM - 3PM on 04-25-16  Remember: Instructions that are not followed completely may result in serious medical risk, up to and including death, or upon the discretion of your surgeon and anesthesiologist your surgery may need to be rescheduled.    _x___ 1. Do not eat food or drink liquids after midnight. No gum chewing or hard candies.     __x__ 2. No Alcohol for 24 hours before or after surgery.   __x__3. No Smoking for 24 prior to surgery.   ____  4. Bring all medications with you on the day of surgery if instructed.    __x__ 5. Notify your doctor if there is any change in your medical condition     (cold, fever, infections).     Do not wear jewelry, make-up, hairpins, clips or nail polish.  Do not wear lotions, powders, or perfumes. You may wear deodorant.  Do not shave 48 hours prior to surgery. Men may shave face and neck.  Do not bring valuables to the hospital.    [facility] is not responsible for any belongings or valuables.               Contacts, dentures or bridgework may not be worn into surgery.  Leave your suitcase in the car. After surgery it may be brought to your room.  For patients admitted to the hospital, discharge time is determined by your treatment team.   Patients discharged the day of surgery will not be allowed to drive home.  You will need someone to drive you home and stay with you the night of your procedure.    Please read over the following fact sheets that you were given:   [facility] Pre[facility] and or MRSA Information   ____ Take anti-hypertensive (unless it includes a diuretic), cardiac, seizure, asthma,     anti-reflux and psychiatric medicines. These include:  1.  NONE  2.  3.  4.  5.  6.  ____Fleets enema or Magnesium Citrate as directed.   ____ Use CHG Soap or sage wipes as directed on instruction sheet   ____ Use inhalers on the day of surgery and bring to hospital day of surgery  ____ Stop Metformin and Janumet 2 days prior to surgery.    ____ Take 1/2 of usual insulin dose the night before surgery and none on the morning     surgery.   ____ Follow recommendations from Cardiologist, Pulmonologist or PCP regarding stopping Aspirin, Coumadin, Pllavix ,Eliquis, Effient, or Pradaxa, and Pletal.  X____Stop Anti-inflammatories such as Advil, Aleve, Ibuprofen, Motrin, Naproxen, Naprosyn, Goodies powders or aspirin products NOW- OK to take Tylenol    ____ Stop supplements until after surgery.   ____ Bring C-Pap to the hospital.

## 2016-04-25 ENCOUNTER — Telehealth: Payer: Self-pay | Admitting: Obstetrics and Gynecology

## 2016-04-25 NOTE — Telephone Encounter (Signed)
Patient called to see she contacted her [employer] agent, and that everything was fine now. I checked Blue e while the patient was on the phone, and the alert note was gone.

## 2016-04-26 ENCOUNTER — Encounter: Payer: Self-pay | Admitting: *Deleted

## 2016-04-26 ENCOUNTER — Ambulatory Visit: Payer: BLUE CROSS/BLUE SHIELD | Admitting: Anesthesiology

## 2016-04-26 ENCOUNTER — Encounter
Admission: RE | Disposition: A | Discharge status: Home / Self Care | Payer: Self-pay | Source: Ambulatory Visit | Attending: Obstetrics and Gynecology

## 2016-04-26 ENCOUNTER — Ambulatory Visit
Admission: RE | Admit: 2016-04-26 | Discharge: 2016-04-26 | Disposition: A | Discharge status: Home / Self Care | Payer: BLUE CROSS/BLUE SHIELD | Source: Ambulatory Visit | Attending: Obstetrics and Gynecology | Admitting: Obstetrics and Gynecology

## 2016-04-26 DIAGNOSIS — T8332XD Displacement of intrauterine contraceptive device, subsequent encounter: Secondary | ICD-10-CM

## 2016-04-26 DIAGNOSIS — Z309 Encounter for contraceptive management, unspecified: Secondary | ICD-10-CM | POA: Diagnosis not present

## 2016-04-26 DIAGNOSIS — T8332XA Displacement of intrauterine contraceptive device, initial encounter: Secondary | ICD-10-CM | POA: Diagnosis present

## 2016-04-26 DIAGNOSIS — Z30430 Encounter for insertion of intrauterine contraceptive device: Secondary | ICD-10-CM

## 2016-04-26 DIAGNOSIS — Z30433 Encounter for removal and reinsertion of intrauterine contraceptive device: Secondary | ICD-10-CM | POA: Diagnosis not present

## 2016-04-26 DIAGNOSIS — E079 Disorder of thyroid, unspecified: Secondary | ICD-10-CM | POA: Insufficient documentation

## 2016-04-26 DIAGNOSIS — Y762 Prosthetic and other implants, materials and accessory obstetric and gynecological devices associated with adverse incidents: Secondary | ICD-10-CM | POA: Insufficient documentation

## 2016-04-26 HISTORY — PX: LAPAROSCOPY: SHX197

## 2016-04-26 HISTORY — PX: IUD REMOVAL: SHX5392

## 2016-04-26 HISTORY — PX: INTRAUTERINE DEVICE (IUD) INSERTION: SHX5877

## 2016-04-26 LAB — COMPREHENSIVE METABOLIC PANEL
ALK PHOS: 66 U/L (ref 38–126)
ALT: 23 U/L (ref 14–54)
ANION GAP: 7 (ref 5–15)
AST: 18 U/L (ref 15–41)
Albumin: 4.5 g/dL (ref 3.5–5.0)
BILIRUBIN TOTAL: 0.6 mg/dL (ref 0.3–1.2)
BUN: 8 mg/dL (ref 6–20)
CALCIUM: 9 mg/dL (ref 8.9–10.3)
CO2: 25 mmol/L (ref 22–32)
CREATININE: 0.69 mg/dL (ref 0.44–1.00)
Chloride: 107 mmol/L (ref 101–111)
GFR calc non Af Amer: >60 mL/min (ref >60)
GLUCOSE: 96 mg/dL (ref 65–99)
Potassium: 3.9 mmol/L (ref 3.5–5.1)
SODIUM: 139 mmol/L (ref 135–145)
Total Protein: 7.7 g/dL (ref 6.5–8.1)

## 2016-04-26 LAB — TYPE AND SCREEN
ABO/RH(D): A POS
ANTIBODY SCREEN: NEGATIVE

## 2016-04-26 LAB — CBC
HEMATOCRIT: 40.1 % (ref 35.0–47.0)
HEMOGLOBIN: 13.8 g/dL (ref 12.0–16.0)
MCH: 30.9 pg (ref 26.0–34.0)
MCHC: 34.3 g/dL (ref 32.0–36.0)
MCV: 89.9 fL (ref 80.0–100.0)
Platelets: 307 10*3/uL (ref 150–440)
RBC: 4.46 MIL/uL (ref 3.80–5.20)
RDW: 13.6 % (ref 11.5–14.5)
WBC: 8.4 10*3/uL (ref 3.6–11.0)

## 2016-04-26 LAB — POCT PREGNANCY, URINE: Preg Test, Ur: NEGATIVE

## 2016-04-26 SURGERY — LAPAROSCOPY OPERATIVE
Anesthesia: General | Site: Uterus | Wound class: Clean

## 2016-04-26 MED ORDER — FENTANYL CITRATE (PF) 100 MCG/2ML IJ SOLN
25.0000 ug | INTRAMUSCULAR | Status: AC | PRN
  Administered 2016-04-26 (×6): 25 ug via INTRAVENOUS

## 2016-04-26 MED ORDER — KETOROLAC TROMETHAMINE 30 MG/ML IJ SOLN
INTRAMUSCULAR | Status: AC
  Filled 2016-04-26: qty 1

## 2016-04-26 MED ORDER — SUCCINYLCHOLINE CHLORIDE 20 MG/ML IJ SOLN
INTRAMUSCULAR | Status: DC | PRN
Start: 2016-04-26 — End: 2016-04-26
  Administered 2016-04-26: 120 mg via INTRAVENOUS

## 2016-04-26 MED ORDER — KETOROLAC TROMETHAMINE 30 MG/ML IJ SOLN
INTRAMUSCULAR | Status: DC | PRN
  Administered 2016-04-26: 30 mg via INTRAVENOUS

## 2016-04-26 MED ORDER — SILVER NITRATE-POT NITRATE 75-25 % EX MISC
CUTANEOUS | Status: AC
  Filled 2016-04-26: qty 4

## 2016-04-26 MED ORDER — HYDROCODONE-ACETAMINOPHEN 5-325 MG PO TABS
ORAL_TABLET | ORAL | Status: AC
  Filled 2016-04-26: qty 1

## 2016-04-26 MED ORDER — MIDAZOLAM HCL 2 MG/2ML IJ SOLN
INTRAMUSCULAR | Status: AC
  Filled 2016-04-26: qty 2

## 2016-04-26 MED ORDER — SUGAMMADEX SODIUM 200 MG/2ML IV SOLN
INTRAVENOUS | Status: DC | PRN
  Administered 2016-04-26: 170 mg via INTRAVENOUS

## 2016-04-26 MED ORDER — HYDROCODONE-ACETAMINOPHEN 5-325 MG PO TABS
1.0000 | ORAL_TABLET | Freq: Four times a day (QID) | ORAL | Status: DC | PRN
  Administered 2016-04-26: 1 via ORAL

## 2016-04-26 MED ORDER — ONDANSETRON HCL 4 MG/2ML IJ SOLN
INTRAMUSCULAR | Status: DC | PRN
  Administered 2016-04-26: 4 mg via INTRAVENOUS

## 2016-04-26 MED ORDER — LACTATED RINGERS IV SOLN
INTRAVENOUS | Status: DC
  Administered 2016-04-26: 14:00:00 via INTRAVENOUS

## 2016-04-26 MED ORDER — BUPIVACAINE HCL (PF) 0.5 % IJ SOLN
INTRAMUSCULAR | Status: DC | PRN
  Administered 2016-04-26: 5 mL

## 2016-04-26 MED ORDER — BUPIVACAINE HCL (PF) 0.5 % IJ SOLN
INTRAMUSCULAR | Status: AC
  Filled 2016-04-26: qty 30

## 2016-04-26 MED ORDER — FENTANYL CITRATE (PF) 100 MCG/2ML IJ SOLN
INTRAMUSCULAR | Status: DC | PRN
  Administered 2016-04-26 (×2): 50 ug via INTRAVENOUS

## 2016-04-26 MED ORDER — ONDANSETRON HCL 4 MG/2ML IJ SOLN
4.0000 mg | Freq: Once | INTRAMUSCULAR | Status: AC | PRN
Start: 2016-04-26 — End: 2016-04-26
  Administered 2016-04-26: 4 mg via INTRAVENOUS

## 2016-04-26 MED ORDER — FENTANYL CITRATE (PF) 100 MCG/2ML IJ SOLN
INTRAMUSCULAR | Status: AC
  Administered 2016-04-26: 25 ug via INTRAVENOUS
  Filled 2016-04-26: qty 2

## 2016-04-26 MED ORDER — PROPOFOL 10 MG/ML IV BOLUS
INTRAVENOUS | Status: AC
  Filled 2016-04-26: qty 20

## 2016-04-26 MED ORDER — ONDANSETRON HCL 4 MG/2ML IJ SOLN
INTRAMUSCULAR | Status: AC
  Filled 2016-04-26: qty 2

## 2016-04-26 MED ORDER — ROCURONIUM BROMIDE 50 MG/5ML IV SOLN
INTRAVENOUS | Status: AC
  Filled 2016-04-26: qty 1

## 2016-04-26 MED ORDER — MIDAZOLAM HCL 2 MG/2ML IJ SOLN
INTRAMUSCULAR | Status: DC | PRN
  Administered 2016-04-26: 2 mg via INTRAVENOUS

## 2016-04-26 MED ORDER — PROPOFOL 10 MG/ML IV BOLUS
INTRAVENOUS | Status: DC | PRN
Start: 2016-04-26 — End: 2016-04-26
  Administered 2016-04-26: 150 mg via INTRAVENOUS

## 2016-04-26 MED ORDER — FERRIC SUBSULFATE 259 MG/GM EX SOLN
CUTANEOUS | Status: AC
  Filled 2016-04-26: qty 8

## 2016-04-26 MED ORDER — LIDOCAINE HCL (PF) 2 % IJ SOLN
INTRAMUSCULAR | Status: AC
  Filled 2016-04-26: qty 2

## 2016-04-26 MED ORDER — HYDROCODONE-ACETAMINOPHEN 5-325 MG PO TABS: 1.0000 | ORAL_TABLET | Freq: Four times a day (QID) | ORAL | 0 refills | Status: DC | PRN

## 2016-04-26 MED ORDER — LACTATED RINGERS IV SOLN
INTRAVENOUS | Status: DC
  Administered 2016-04-26: 18:00:00 via INTRAVENOUS

## 2016-04-26 MED ORDER — IBUPROFEN 600 MG PO TABS: 600.0000 mg | ORAL_TABLET | Freq: Four times a day (QID) | ORAL | 0 refills | Status: DC | PRN

## 2016-04-26 MED ORDER — ROCURONIUM BROMIDE 100 MG/10ML IV SOLN
INTRAVENOUS | Status: DC | PRN
  Administered 2016-04-26 (×2): 20 mg via INTRAVENOUS

## 2016-04-26 MED ORDER — DEXAMETHASONE SODIUM PHOSPHATE 10 MG/ML IJ SOLN
INTRAMUSCULAR | Status: DC | PRN
  Administered 2016-04-26: 10 mg via INTRAVENOUS

## 2016-04-26 MED ORDER — FAMOTIDINE 20 MG PO TABS
20.0000 mg | ORAL_TABLET | Freq: Once | ORAL | Status: AC
  Administered 2016-04-26: 20 mg via ORAL

## 2016-04-26 MED ORDER — SEVOFLURANE IN SOLN
RESPIRATORY_TRACT | Status: AC
  Filled 2016-04-26: qty 250

## 2016-04-26 MED ORDER — FENTANYL CITRATE (PF) 100 MCG/2ML IJ SOLN
INTRAMUSCULAR | Status: AC
  Filled 2016-04-26: qty 2

## 2016-04-26 MED ORDER — DEXAMETHASONE SODIUM PHOSPHATE 10 MG/ML IJ SOLN
INTRAMUSCULAR | Status: AC
  Filled 2016-04-26: qty 1

## 2016-04-26 MED ORDER — FAMOTIDINE 20 MG PO TABS
ORAL_TABLET | ORAL | Status: AC
  Filled 2016-04-26: qty 1

## 2016-04-26 MED ORDER — LIDOCAINE HCL (CARDIAC) 20 MG/ML IV SOLN
INTRAVENOUS | Status: DC | PRN
  Administered 2016-04-26: 50 mg via INTRAVENOUS

## 2016-04-26 MED ORDER — SILVER NITRATE-POT NITRATE 75-25 % EX MISC
CUTANEOUS | Status: DC | PRN
  Administered 2016-04-26: 6

## 2016-04-26 SURGICAL SUPPLY — 31 items
BAG URO DRAIN 2000ML W/SPOUT (MISCELLANEOUS) ×4 IMPLANT
CANISTER SUCT 3000ML (MISCELLANEOUS) ×4 IMPLANT
CATH FOLEY 2WAY  5CC 16FR (CATHETERS) ×1
CATH ROBINSON RED A/P 16FR (CATHETERS) ×4 IMPLANT
CATH URTH 16FR FL 2W BLN LF (CATHETERS) ×3 IMPLANT
DEFOGGER SCOPE WARMER CLEARIFY (MISCELLANEOUS) ×4 IMPLANT
DERMABOND ADVANCED (GAUZE/BANDAGES/DRESSINGS) ×1
DERMABOND ADVANCED .7 DNX12 (GAUZE/BANDAGES/DRESSINGS) ×3 IMPLANT
DRAPE LAPAROTOMY 100X77 ABD (DRAPES) ×4 IMPLANT
DRAPE SHEET LG 3/4 BI-LAMINATE (DRAPES) ×4 IMPLANT
GLOVE BIO SURGEON STRL SZ7 (GLOVE) ×4 IMPLANT
GLOVE BIO SURGEON STRL SZ7.5 (GLOVE) ×4 IMPLANT
GLOVE SURG LX 7.5 STRW (GLOVE) ×1
GLOVE SURG LX STRL 7.5 STRW (GLOVE) ×3 IMPLANT
GOWN STRL REUS W/ TWL LRG LVL3 (GOWN DISPOSABLE) ×6 IMPLANT
GOWN STRL REUS W/TWL LRG LVL3 (GOWN DISPOSABLE) ×2
IRRIGATION STRYKERFLOW (MISCELLANEOUS) ×3 IMPLANT
IRRIGATOR STRYKERFLOW (MISCELLANEOUS) ×4
IV LACTATED RINGERS 1000ML (IV SOLUTION) ×4 IMPLANT
KIT RM TURNOVER CYSTO AR (KITS) ×4 IMPLANT
NOVASURE ENDOMETRIAL ABLATION (MISCELLANEOUS) ×4 IMPLANT
NS IRRIG 500ML POUR BTL (IV SOLUTION) ×4 IMPLANT
PACK DNC HYST (MISCELLANEOUS) ×4 IMPLANT
PAD OB MATERNITY 4.3X12.25 (PERSONAL CARE ITEMS) ×4 IMPLANT
PAD PREP 24X41 OB/GYN DISP (PERSONAL CARE ITEMS) ×4 IMPLANT
SLEEVE ENDOPATH XCEL 5M (ENDOMECHANICALS) ×4 IMPLANT
SYRINGE 10CC LL (SYRINGE) ×4 IMPLANT
TOWEL OR 17X26 4PK STRL BLUE (TOWEL DISPOSABLE) ×4 IMPLANT
TROCAR XCEL NON-BLD 5MMX100MML (ENDOMECHANICALS) ×4 IMPLANT
TUBING CONNECTING 10 (TUBING) ×4 IMPLANT
TUBING INSUFFLATION (TUBING) ×4 IMPLANT

## 2016-04-26 NOTE — Anesthesia Post-op Follow-up Note (Cosign Needed)
Anesthesia QCDR form completed.        

## 2016-04-26 NOTE — Anesthesia Postprocedure Evaluation (Signed)
Anesthesia Post Note  Patient: Kari CooleySamantha Frost  Procedure(s) Performed: Procedure(s) (LRB): LAPAROSCOPY OPERATIVE (N/A) INTRAUTERINE DEVICE (IUD) REMOVAL (N/A) INTRAUTERINE DEVICE (IUD) INSERTION (N/A)  Patient location during evaluation: PACU Anesthesia Type: General Level of consciousness: awake and alert Pain management: pain level controlled Vital Signs Assessment: post-procedure vital signs reviewed and stable Respiratory status: spontaneous breathing and respiratory function stable Cardiovascular status: stable Anesthetic complications: no     Last Vitals:  Vitals:   04/26/16 1813 04/26/16 1840  BP: 122/88 103/61  Pulse:  89  Resp:  16  Temp:      Last Pain:  Vitals:   04/26/16 1840  TempSrc:   PainSc: 4                  Porshea Janowski K

## 2016-04-26 NOTE — Progress Notes (Signed)
Report to Pat Robinson RN. 

## 2016-04-26 NOTE — Anesthesia Procedure Notes (Signed)
Procedure Name: Intubation Date/Time: 04/26/2016 4:09 PM Performed by: Ginger CarneMICHELET, Courtney Bellizzi Pre-anesthesia Checklist: Patient identified, Emergency Drugs available, Suction available, Patient being monitored and Timeout performed Patient Re-evaluated:Patient Re-evaluated prior to inductionOxygen Delivery Method: Circle system utilized Preoxygenation: Pre-oxygenation with 100% oxygen Intubation Type: IV induction Ventilation: Mask ventilation without difficulty Laryngoscope Size: Miller and 2 Grade View: Grade I Tube type: Oral Tube size: 7.0 mm Number of attempts: 1 Airway Equipment and Method: Stylet Placement Confirmation: ETT inserted through vocal cords under direct vision,  positive ETCO2 and breath sounds checked- equal and bilateral Secured at: 22 cm Tube secured with: Tape Dental Injury: Teeth and Oropharynx as per pre-operative assessment

## 2016-04-26 NOTE — Transfer of Care (Signed)
Immediate Anesthesia Transfer of Care Note  Patient: Kari CooleySamantha Frost  Procedure(s) Performed: Procedure(s) with comments: LAPAROSCOPY OPERATIVE (N/A) INTRAUTERINE DEVICE (IUD) REMOVAL (N/A) INTRAUTERINE DEVICE (IUD) INSERTION (N/A) - lot [id number] exp 09/012020 mirena  Patient Location: PACU  Anesthesia Type:General  Level of Consciousness: awake  Airway & Oxygen Therapy: Patient Spontanous Breathing and Patient connected to face mask oxygen  Post-op Assessment: Report given to RN and Post -op Vital signs reviewed and stable  Post vital signs: Reviewed  Last Vitals:  Vitals:   04/26/16 1246 04/26/16 1709  BP: 116/75 103/85  Pulse: 92 85  Resp: 18 18  Temp: 36.7 C 36.7 C    Last Pain:  Vitals:   04/26/16 1246  TempSrc: Oral         Complications: No apparent anesthesia complications

## 2016-04-26 NOTE — Op Note (Signed)
Operative Note    Pre-Op Diagnosis: Displacement of intrauterine device, suspected to be in abdominal cavity  Post-Op Diagnosis: Displacement of intrauterine device, in abdominal cavity   Procedures:  1. Operative laparoscopy 2. Removal of intrauterine device from abdominal cavity 3. Placement of intrauterine device in uterus  Primary Surgeon: Thomasene MohairStephen Ura Hausen, MD   EBL: 25 mL   IVF: 800 mL crystalloid  Urine output: 150 mL  Specimens: none  Drains: None  Complications: None   Disposition: PACU   Condition: Stable   Findings:  1) normal appearing uterus, fallopian tubes, and ovaries 2) intrauterine device in cul-de-sac  3) placement of intrauterine device in uterus without uterine perforation (observed laparoscopically)  Procedure Summary:  The patient was taken to the operating room where general anesthesia was administered and found to be adequate. She was placed in the dorsal supine lithotomy position in [place] stirrups and prepped and draped in usual sterile fashion. After a timeout was called an indwelling catheter was placed in her bladder. A sterile speculum was placed in the vagina and a single-tooth tenaculum was used to grasp the anterior lip of the cervix. An acorn uterine manipulator was affixed to the tenaculum. The speculum was removed from the vagina.  Attention was turned to the abdomen where after injection of local anesthetic, a 5 mm infraumbilical incision was made with the scalpel. Entry into the abdomen was obtained via Optiview trocar technique (a blunt entry technique with camera visualization through the obturator upon entry). Verification of entry into the abdomen was obtained using opening pressures. The abdomen was insufflated with CO2. The camera was introduced through the trocar with verification of atraumatic entry.  A 5 mm suprapubic port was created under direct intra-abdominal camera visualization without difficulty.  After the patient was  placed in steep Trendelenburg position, a survey of the pelvis was taken with the above-noted findings. In the cul-de-sac the intrauterine device was noted. The strings were grasped with a blunt grasper. The IUD was easily removed through the suprapubic port.  The peritoneum where the IUD was located was inspected and found to be minimally disrupted and hemostatic.   Attention was turned to the pelvis where the acorn uterine manipulator was removed. The Mirena IUD system was placed according to the standard manufacturer's recommendations. The uterus was sounded to a depth of 9 cm. The IUD inserter was advanced until slight resistance was felt.  The IUD inserter was retracted 2 cm (to a depth of 7cm). The IUD was deployed and a delay a 5 seconds was given. The inserter was advanced again until slight resistance was felt at the 9 cm mark on the inserter. The IUD was released from the inserter and the inserter was removed from the uterus. The IUD strings were trimmed to 3 cm length from the cervix. This entire process was monitored laparoscopically and no perforation of the uterus was noted. The single-tooth tenaculum was removed from the cervix and with silver nitrate the insertion sites of the tenaculum were hemostatic. The speculum was removed from the vagina after ensuring no other instruments or sponges remained. The Foley catheter was removed.  Attention was returned to the laparoscopic portion of the case. The peritoneal site where the IUD was situated was reinspected and was found to be hemostatic still. This terminated the procedure. The abdomen was desufflated of CO2. Five deep breaths were given by anesthesia. All trochars were removed. The incision sites were closed using surgical skin glue.  The patient tolerated the procedure  well.  Sponge, lap, needle, and instrument counts were correct x 2.  VTE prophylaxis: SCDs. Antibiotic prophylaxis: none indicated and none was given. She was awakened in the  operating room and was taken to the PACU in stable condition.   [name], MD 04/26/2016 4:58 PM

## 2016-04-26 NOTE — H&P (Signed)
Preoperative History and Physical  Kari CooleySamantha Frost is a 31 y.o. female here for surgical management of intrauterine device compilcation.  No significant preoperative concerns.  History of Present Illness: She had a Mirena IUD placed in 2015 by a different provider.  She presented recently to have the IUD checked. No IUD strings were found. A pelvic (transvaginal) ultrasound did not show an IUD in the uterus (there was a suggestion of an IUD in the posterior cul-de-sac, but was not definitive).  An abdominal x-ray film showed an IUD located in the pelvis.  In the office an transabdominal ultrasound again could not demonstrate an IUD in the uterus.  The IUD was not definitively seen around the uterus at this point either.  The patient has been asymptomatic but is highly concerned that her IUD is not definitively located.  She desires to have the IUD removed and placed in the correct location.  A reasonable attempt to verify the location of the IUD has been undertaken and given there is no evidence of the IUD in the uterus on ultrasound, the plan is to start laparoscopically.  If not found, will perform hysteroscopy.  At the end of the case will place the Mirena IUD in the uterus and verify no uterine perforation by assessing laparoscopically.     Proposed surgery: operative laparoscopy, removal of intrauterine device.  If not found, will perform hysteroscopy and removal of intrauterine device.  At the end of the case will place a Mirena intrauterine device in the usual fashion.  Past Medical History:  Diagnosis Date  . Thyroid disease   . UTI (urinary tract infection)    Past Surgical History:  Procedure Laterality Date  . NO PAST SURGERIES     OB/GYN History  Patient is a G1P1  Patient denies any other pertinent gynecologic issues apart from those noted in the HPI.    Current Outpatient Prescriptions on File Prior to Encounter  Medication Sig Dispense Refill  . levonorgestrel (MIRENA) 20  MCG/24HR IUD 1 each by Intrauterine route once.     Allergies:No Known Allergies  Social History:   reports that she has never smoked. She has never used smokeless tobacco. She reports that she does not drink alcohol or use drugs.  Family history: patient denies gynecologic cancers  Review of Systems: Review of Systems  Constitutional: Negative.   HENT: Negative.   Eyes: Negative.   Respiratory: Negative.   Cardiovascular: Negative.   Gastrointestinal: Negative.   Genitourinary: Negative.   Musculoskeletal: Negative.   Skin: Negative.   Neurological: Negative.   Psychiatric/Behavioral: Negative.     PHYSICAL EXAM: Blood pressure 116/75, pulse 92, temperature 98.1 F (36.7 C), temperature source Oral, resp. rate 18, last menstrual period 04/22/2016, SpO2 100 %. CONSTITUTIONAL: Well-developed, well-nourished female in no acute distress.  HENT:  Normocephalic, atraumatic, External right and left ear normal. Oropharynx is clear and moist EYES: Conjunctivae and EOM are normal. Pupils are equal, round, and reactive to light. No scleral icterus.  NECK: Normal range of motion, supple, no masses SKIN: Skin is warm and dry. No rash noted. Not diaphoretic. No erythema. No pallor. NEUROLGIC: Alert and oriented to person, place, and time. Normal reflexes, muscle tone coordination. No cranial nerve deficit noted. PSYCHIATRIC: Normal mood and affect. Normal behavior. Normal judgment and thought content. CARDIOVASCULAR: Normal heart rate noted, regular rhythm RESPIRATORY: Effort and breath sounds normal, no problems with respiration noted ABDOMEN: Soft, nontender, nondistended. PELVIC: Deferred MUSCULOSKELETAL: Normal range of motion. No edema and no  tenderness. 2+ distal pulses.  Labs: Results for orders placed or performed during the hospital encounter of 04/26/16 (from the past 336 hour(s))  CBC   Collection Time: 04/26/16 12:51 PM  Result Value Ref Range   WBC 8.4 3.6 - 11.0 K/uL    RBC 4.46 3.80 - 5.20 MIL/uL   Hemoglobin 13.8 12.0 - 16.0 g/dL   HCT 96.0 45.4 - 09.8 %   MCV 89.9 80.0 - 100.0 fL   MCH 30.9 26.0 - 34.0 pg   MCHC 34.3 32.0 - 36.0 g/dL   RDW 11.9 14.7 - 82.9 %   Platelets 307 150 - 440 K/uL  Pregnancy, urine POC   Collection Time: 04/26/16 12:54 PM  Result Value Ref Range   Preg Test, Ur NEGATIVE NEGATIVE    Imaging Studies: Dg Abd 1 View  Result Date: 04/11/2016 CLINICAL DATA:  Evaluate IUD placement with IUD not visualized on ultrasound EXAM: ABDOMEN - 1 VIEW COMPARISON:  Ultrasound pelvis of 04/10/2016 FINDINGS: An IUD is noted in the mid upper pelvis minimally to the right of midline. The bowel gas pattern is nonspecific. No opaque calculi are seen. No bony abnormality is noted. IMPRESSION: IUD overlies the midline of the mid upper pelvis minimally to the right of midline. Electronically Signed   By: Dwyane Dee M.D.   On: 04/11/2016 16:42    Assessment: Patient Active Problem List   Diagnosis Date Noted  . Displacement of intrauterine contraceptive device 04/10/2016    Plan: Patient will undergo surgical management with operative laparoscopy, removal of intrauterine device.  If not found, will perform hysteroscopy and removal of intrauterine device.  At the end of the case will place a Mirena intrauterine device in the usual fashion.   The risks of surgery were discussed in detail with the patient including but not limited to: bleeding which may require transfusion or reoperation; infection which may require antibiotics; injury to surrounding organs which may involve bowel, bladder, ureters ; need for additional procedures including laparoscopy or laparotomy; thromboembolic phenomenon, surgical site problems and other postoperative/anesthesia complications. Likelihood of success in alleviating the patient's condition was discussed. Routine postoperative instructions will be reviewed with the patient and her family in detail after surgery.  The  patient concurred with the proposed plan, giving informed written consent for the surgery.  Patient has been NPO since last night she will remain NPO for procedure.  Anesthesia and OR aware.  Preoperative prophylactic antibiotics and SCDs ordered on call to the OR, as indicated for this surgery.  To OR when ready.  [name], MD 04/26/2016 1:18 PM

## 2016-04-26 NOTE — Anesthesia Preprocedure Evaluation (Signed)
Anesthesia Evaluation  Patient identified by MRN, date of birth, ID band Patient awake    Reviewed: Allergy & Precautions, NPO status , Patient's Chart, lab work & pertinent test results  History of Anesthesia Complications Negative for: history of anesthetic complications  Airway Mallampati: II       Dental   Pulmonary neg pulmonary ROS,           Cardiovascular negative cardio ROS       Neuro/Psych negative neurological ROS     GI/Hepatic negative GI ROS, Neg liver ROS,   Endo/Other  negative endocrine ROS  Renal/GU negative Renal ROS     Musculoskeletal   Abdominal   Peds  Hematology negative hematology ROS (+)   Anesthesia Other Findings   Reproductive/Obstetrics                             Anesthesia Physical Anesthesia Plan  ASA: I  Anesthesia Plan: General   Post-op Pain Management:    Induction: Intravenous  Airway Management Planned: Oral ETT  Additional Equipment:   Intra-op Plan:   Post-operative Plan:   Informed Consent: I have reviewed the patients History and Physical, chart, labs and discussed the procedure including the risks, benefits and alternatives for the proposed anesthesia with the patient or authorized representative who has indicated his/her understanding and acceptance.     Plan Discussed with:   Anesthesia Plan Comments:         Anesthesia Quick Evaluation

## 2016-04-26 NOTE — Discharge Instructions (Signed)
AMBULATORY SURGERY  DISCHARGE INSTRUCTIONS   1) The drugs that you were given will stay in your system until tomorrow so for the next 24 hours you should not:  A) Drive an automobile B) Make any legal decisions C) Drink any alcoholic beverage   2) You may resume regular meals tomorrow.  Today it is better to start with liquids and gradually work up to solid foods.  You may eat anything you prefer, but it is better to start with liquids, then soup and crackers, and gradually work up to solid foods.   3) Please notify your doctor immediately if you have any unusual bleeding, trouble breathing, redness and pain at the surgery site, drainage, fever, or pain not relieved by medication. 4)   5) Your post-operative visit with Dr.                                     is: Date:                        Time:    Please call to schedule your post-operative visit.  6) Additional Instructions:     Intrauterine Device Insertion, Care After This sheet gives you information about how to care for yourself after your procedure. Your health care provider may also give you more specific instructions. If you have problems or questions, contact your health care provider. What can I expect after the procedure? After the procedure, it is common to have:  Cramps and pain in the abdomen.  Light bleeding (spotting) or heavier bleeding that is like your menstrual period. This may last for up to a few days.  Lower back pain.  Dizziness.  Headaches.  Nausea. Follow these instructions at home:  Before resuming sexual activity, check to make sure that you can feel the IUD string(s). You should be able to feel the end of the string(s) below the opening of your cervix. If your IUD string is in place, you may resume sexual activity.  If you had a hormonal IUD inserted more than 7 days after your most recent period started, you will need to use a backup method of birth control for 7 days after IUD  insertion. Ask your health care provider whether this applies to you.  Continue to check that the IUD is still in place by feeling for the string(s) after every menstrual period, or once a month.  Take over-the-counter and prescription medicines only as told by your health care provider.  Do not drive or use heavy machinery while taking prescription pain medicine.  Keep all follow-up visits as told by your health care provider. This is important. Contact a health care provider if:  You have bleeding that is heavier or lasts longer than a normal menstrual cycle.  You have a fever.  You have cramps or abdominal pain that get worse or do not get better with medicine.  You develop abdominal pain that is new or is not in the same area of earlier cramping and pain.  You feel lightheaded or weak.  You have abnormal or bad-smelling discharge from your vagina.  You have pain during sexual activity.  You have any of the following problems with your IUD string(s):  The string bothers or hurts you or your sexual partner.  You cannot feel the string.  The string has gotten longer.  You can feel the IUD  in your vagina.  You think you may be pregnant, or you miss your menstrual period.  You think you may have an STI (sexually transmitted infection). Get help right away if:  You have flu-like symptoms.  You have a fever and chills.  You can feel that your IUD has slipped out of place. Summary  After the procedure, it is common to have cramps and pain in the abdomen. It is also common to have light bleeding (spotting) or heavier bleeding that is like your menstrual period.  Continue to check that the IUD is still in place by feeling for the string(s) after every menstrual period, or once a month.  Keep all follow-up visits as told by your health care provider. This is important.  Contact your health care provider if you have problems with your IUD string(s), such as the string  getting longer or bothering you or your sexual partner. This information is not intended to replace advice given to you by your health care provider. Make sure you discuss any questions you have with your health care provider. Document Released: 09/13/2010 Document Revised: 12/07/2015 Document Reviewed: 12/07/2015 Elsevier Interactive Patient Education  2017 [employer].

## 2016-04-27 ENCOUNTER — Encounter: Payer: Self-pay | Admitting: Obstetrics and Gynecology

## 2016-05-08 ENCOUNTER — Ambulatory Visit
Admission: EM | Admit: 2016-05-08 | Discharge: 2016-05-08 | Disposition: A | Discharge status: Home / Self Care | Payer: BLUE CROSS/BLUE SHIELD | Attending: Family Medicine | Admitting: Family Medicine

## 2016-05-08 DIAGNOSIS — L03039 Cellulitis of unspecified toe: Secondary | ICD-10-CM | POA: Diagnosis not present

## 2016-05-08 MED ORDER — MUPIROCIN 2 % EX OINT: 1.0000 "application " | TOPICAL_OINTMENT | Freq: Three times a day (TID) | CUTANEOUS | 0 refills | Status: DC

## 2016-05-08 MED ORDER — SULFAMETHOXAZOLE-TRIMETHOPRIM 800-160 MG PO TABS: 1.0000 | ORAL_TABLET | Freq: Two times a day (BID) | ORAL | 0 refills | Status: DC

## 2016-05-08 NOTE — ED Provider Notes (Signed)
CSN: [id number]     Arrival date & time 05/08/16  1837 History   First MD Initiated Contact with Patient 05/08/16 1932     Chief Complaint  Patient presents with  . Nail Problem   (Consider location/radiation/quality/duration/timing/severity/associated sxs/prior Treatment) HPI  31 year old female who presents with right great toe pain swelling and redness. She states that she bought a new pair of boots that did not fit well and afterwards noticed swelling pain particularly of the lateral portion has become more painful. He also has smaller one on her left great toe which has not progressed as far.       Past Medical History:  Diagnosis Date  . Thyroid disease   . UTI (urinary tract infection)    Past Surgical History:  Procedure Laterality Date  . INTRAUTERINE DEVICE (IUD) INSERTION  2018  . INTRAUTERINE DEVICE (IUD) INSERTION N/A 04/26/2016   Procedure: INTRAUTERINE DEVICE (IUD) INSERTION;  Surgeon: Conard Novak, MD;  Location: [facility];  Service: Gynecology;  Laterality: N/A;  lot tu01sce exp 09/012020 mirena  . IUD REMOVAL N/A 04/26/2016   Procedure: INTRAUTERINE DEVICE (IUD) REMOVAL;  Surgeon: Conard Novak, MD;  Location: [facility];  Service: Gynecology;  Laterality: N/A;  . LAPAROSCOPY N/A 04/26/2016   Procedure: LAPAROSCOPY OPERATIVE;  Surgeon: Conard Novak, MD;  Location: [facility];  Service: Gynecology;  Laterality: N/A;  . NO PAST SURGERIES     History reviewed. No pertinent family history. Social History  Substance Use Topics  . Smoking status: Never Smoker  . Smokeless tobacco: Never Used  . Alcohol use No   OB History    No data available     Review of Systems  Constitutional: Positive for activity change. Negative for appetite change, chills, fatigue and fever.  Skin: Positive for wound.  All other systems reviewed and are negative.   Allergies  Patient has no known allergies.  Home Medications   Prior to Admission medications    Medication Sig Start Date End Date Taking? Authorizing Provider  HYDROcodone-acetaminophen (NORCO) 5-325 MG tablet Take 1 tablet by mouth every 6 (six) hours as needed (breakthrough pain). 04/26/16   Conard Novak, MD  ibuprofen (ADVIL,MOTRIN) 600 MG tablet Take 1 tablet (600 mg total) by mouth every 6 (six) hours as needed for mild pain or cramping. 04/26/16   Conard Novak, MD  levonorgestrel (MIRENA) 20 MCG/24HR IUD 1 each by Intrauterine route once.    Historical Provider, MD  mupirocin ointment (BACTROBAN) 2 % Apply 1 application topically 3 (three) times daily. 05/08/16   Lutricia Feil, PA-C  sulfamethoxazole-trimethoprim (BACTRIM DS,SEPTRA DS) 800-160 MG tablet Take 1 tablet by mouth 2 (two) times daily. 05/08/16   Lutricia Feil, PA-C   Meds Ordered and Administered this Visit  Medications - No data to display  BP 130/83 (BP Location: Left Arm)   Pulse 91   Temp 98.5 F (36.9 C) (Oral)   Resp 18   Ht  (1.651 m)   Wt 180 lb (81.6 kg)   LMP 04/22/2016 (Exact Date)   SpO2 100%   BMI 29.95 kg/m  No data found.   Physical Exam  Constitutional: She is oriented to person, place, and time. She appears well-developed and well-nourished. No distress.  HENT:  Head: Normocephalic and atraumatic.  Eyes: Pupils are equal, round, and reactive to light.  [id number] range of motion.  Musculoskeletal:  Exam  of the right great toe shows paronychia of the medial aspect.  He does have a small purulent area of the distal tip. There is granulation tissue in the nail fold. She has erythema and tenderness along with warmth. There is no induration present.  Neurological: She is alert and oriented to person, place, and time.  Skin: Skin is warm and dry. She is not diaphoretic.  Psychiatric: She has a normal mood and affect. Her behavior is normal. Judgment and thought content normal.  Nursing note and vitals reviewed.   Urgent Care Course     Procedures (including critical  care time)  Labs Review Labs Reviewed - No data to display  Imaging Review No results found.   Visual Acuity Review  Right Eye Distance:   Left Eye Distance:   Bilateral Distance:    Right Eye Near:   Left Eye Near:    Bilateral Near:     Patient was given a postoperative shoe   MDM   1. Paronychia of toe, unspecified laterality   Bilateral Discharge Medication List as of 05/08/2016  7:49 PM    START taking these medications   Details  mupirocin ointment (BACTROBAN) 2 % Apply 1 application topically 3 (three) times daily., Starting Tue 05/08/2016, Print    sulfamethoxazole-trimethoprim (BACTRIM DS,SEPTRA DS) 800-160 MG tablet Take 1 tablet by mouth 2 (two) times daily., Starting Tue 05/08/2016, Print      Plan: 1. Test/x-ray results and diagnosis reviewed with patient 2. rx as per orders; risks, benefits, potential side effects reviewed with patient 3. Recommend supportive treatment with Compresses applied 4 times daily for 10 minutes at time instructed patient to fully. Dry the area and then apply Bactroban ointment. I have recommended that she follow-up with the podiatrist in the name and address were given to the patient. Should not wear tight fitting shoes and was given a postop shoe for comfort and protection. 4. F/u prn if symptoms worsen or don't improve     Lutricia Feil, PA-C 05/08/16 2009

## 2016-05-08 NOTE — ED Triage Notes (Signed)
Pt states she bought a pair of new boots and after she wore them her right great toe started to hurt and it has progressively gotten worse. I dont visibly see anything on the toe at this time.

## 2016-06-11 DIAGNOSIS — M7751 Other enthesopathy of right foot: Secondary | ICD-10-CM | POA: Diagnosis not present

## 2016-06-11 DIAGNOSIS — L600 Ingrowing nail: Secondary | ICD-10-CM | POA: Diagnosis not present

## 2016-10-24 ENCOUNTER — Encounter: Payer: Self-pay | Admitting: Podiatry

## 2016-10-24 ENCOUNTER — Ambulatory Visit (INDEPENDENT_AMBULATORY_CARE_PROVIDER_SITE_OTHER): Payer: BLUE CROSS/BLUE SHIELD | Admitting: Podiatry

## 2016-10-24 VITALS — BP 128/81 | HR 109

## 2016-10-24 DIAGNOSIS — L600 Ingrowing nail: Secondary | ICD-10-CM

## 2016-10-24 DIAGNOSIS — L03032 Cellulitis of left toe: Secondary | ICD-10-CM

## 2016-10-24 MED ORDER — CEPHALEXIN 500 MG PO CAPS: 500.0000 mg | ORAL_CAPSULE | Freq: Two times a day (BID) | ORAL | 0 refills | Status: DC

## 2016-10-24 NOTE — Progress Notes (Signed)
   Subjective:    Patient ID: Kari Frost, female    DOB: May 29, 1985, 29 y.o.   MRN: [id number]  HPI this patient presents the office with chief complaint of a painful infected ingrown toenail left big toe left foot.  She says this ingrown toenail has been present for the last 4 weeks.  She says the nail attempts to improve, but then worsens again.  She says she had an infected ingrown toenail of the right foot, which was treated by Dr. Elijah Birk.  She said there office was out of network and she was responsible for the bill.  She was upset about them not telling her that Dr. Elijah Birk was out of the [employer].  She says that she has been soaking her foot and using an appointment on the nail, but the problem persists.  She has difficulty walking and wearing her shoes due to the ingrown nail.  She presents the office today for an evaluation and treatment of this painful ingrown toenail    Review of Systems  All other systems reviewed and are negative.      Objective:   Physical Exam General Appearance  Alert, conversant and in no acute stress.  Vascular  Dorsalis pedis and posterior pulses are palpable  bilaterally.  Capillary return is within normal limits  Bilaterally. Temperature is within normal limits  Bilaterally  Neurologic  Senn-Weinstein monofilament wire test within normal limits  bilaterally. Muscle power  Within normal limits bilaterally.  Nails marked incurvation noted along the lateral border of the left great toe.  There is significant granulation tissue noted on the lateral border of the left great toe. Redness and swelling is also noted along the nail border  Orthopedic  No limitations of motion of motion feet bilaterally.  No crepitus or effusions noted.  HAV  B/L  Skin  normotropic skin with no porokeratosis noted bilaterally.  No signs of infections or ulcers noted.          Assessment & Plan:  Ingrown toenail lateral border left great toenail.  Paronychia left  hallux.  IE  Nail surgery.  Treatment options and alternatives discussed.  Recommended an incision and drainage and patient agreed.  Left hallux  was prepped with alcohol and a 3cc. of  2% lidocaine plain was administered in a digital block fashion.  The toe was then prepped with betadine solution .  The offending nail border was then excised and all necrotic tissue was resected.  The area was then cleansed  and antibiotic ointment and a dry sterile dressing was applied.  The patient was dispensed instructions for aftercare. Prescribed cephalexin.  Discussed the treatment for the ingrown toenail and she is scheduled to return in 2 weeks for reevaluation and possible cauterization of the lateral border of the left great toe nail.   Helane Gunther DPM

## 2016-11-07 ENCOUNTER — Ambulatory Visit: Payer: BLUE CROSS/BLUE SHIELD | Admitting: Podiatry

## 2016-11-13 ENCOUNTER — Ambulatory Visit
Admission: EM | Admit: 2016-11-13 | Discharge: 2016-11-13 | Disposition: A | Discharge status: Home / Self Care | Payer: BLUE CROSS/BLUE SHIELD | Attending: Family Medicine | Admitting: Family Medicine

## 2016-11-13 ENCOUNTER — Encounter: Payer: Self-pay | Admitting: *Deleted

## 2016-11-13 DIAGNOSIS — R112 Nausea with vomiting, unspecified: Secondary | ICD-10-CM

## 2016-11-13 DIAGNOSIS — R197 Diarrhea, unspecified: Secondary | ICD-10-CM

## 2016-11-13 DIAGNOSIS — J029 Acute pharyngitis, unspecified: Secondary | ICD-10-CM | POA: Diagnosis not present

## 2016-11-13 LAB — RAPID STREP SCREEN (MED CTR MEBANE ONLY): STREPTOCOCCUS, GROUP A SCREEN (DIRECT): NEGATIVE

## 2016-11-13 MED ORDER — ONDANSETRON 8 MG PO TBDP: 8.0000 mg | ORAL_TABLET | Freq: Two times a day (BID) | ORAL | 0 refills | Status: DC

## 2016-11-13 NOTE — ED Provider Notes (Addendum)
[facility]    CSN: [id number] Arrival date & time: 11/13/16  1741     History   Chief Complaint Chief Complaint  Patient presents with  . Sore Throat  . Abdominal Pain  . Nausea  . Cough    HPI Kari Frost is a 31 y.o. female.   HPI  This a 31 year old female who presents with 2 day history of a sore throat,abdominal pain that is described as colicky prior to bowel movement, nausea that continues and vomiting that happened only on Sunday and diarrhea. The diarrhea has been watery without blood or mucus. She had 3 episodes today. Over the weekend they attended the fair and she ate funnel cake and a corn dog.Her son also ate a portion of her corn dog but he is fine. 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 Feels very dry. Patient is afebrile today pulse rates 115.she does state that the abdominal symptoms have lessened         Past Medical History:  Diagnosis Date  . Thyroid disease   . UTI (urinary tract infection)     Patient Active Problem List   Diagnosis Date Noted  . Displacement of intrauterine contraceptive device 04/10/2016  . Thyroid enlargement 11/30/2014    Past Surgical History:  Procedure Laterality Date  . INTRAUTERINE DEVICE (IUD) INSERTION  2018  . INTRAUTERINE DEVICE (IUD) INSERTION N/A 04/26/2016   Procedure: INTRAUTERINE DEVICE (IUD) INSERTION;  Surgeon: Conard Novak, MD;  Location: [facility];  Service: Gynecology;  Laterality: N/A;  lot tu01sce exp 09/012020 mirena  . IUD REMOVAL N/A 04/26/2016   Procedure: INTRAUTERINE DEVICE (IUD) REMOVAL;  Surgeon: Conard Novak, MD;  Location: [facility];  Service: Gynecology;  Laterality: N/A;  . LAPAROSCOPY N/A 04/26/2016   Procedure: LAPAROSCOPY OPERATIVE;  Surgeon: Conard Novak, MD;  Location: [facility];  Service: Gynecology;  Laterality: N/A;  . NO PAST SURGERIES      OB History    No data available       Home Medications     Prior to Admission medications   Medication Sig Start Date End Date Taking? Authorizing Provider  levonorgestrel (MIRENA) 20 MCG/24HR IUD 1 each by Intrauterine route once.   Yes [provider]  ibuprofen (ADVIL,MOTRIN) 600 MG tablet Take 1 tablet (600 mg total) by mouth every 6 (six) hours as needed for mild pain or cramping. 04/26/16   Conard Novak, MD  ondansetron (ZOFRAN ODT) 8 MG disintegrating tablet Take 1 tablet (8 mg total) by mouth 2 (two) times daily. 11/13/16   Lutricia Feil, PA-C    Family History History reviewed. No pertinent family history.  Social History Social History  Substance Use Topics  . Smoking status: Never Smoker  . Smokeless tobacco: Never Used  . Alcohol use No     Allergies   Patient has no known allergies.   Review of Systems Review of Systems  Constitutional: Positive for activity change. Negative for appetite change, chills, fatigue and fever.  HENT: Positive for sore throat.   Gastrointestinal: Positive for abdominal pain, diarrhea, nausea and vomiting.  All other systems reviewed and are negative.    Physical Exam Triage Vital Signs ED Triage Vitals  Enc Vitals Group     BP 11/13/16 1823 129/82     Pulse Rate 11/13/16 1823 (!) 115     Resp 11/13/16 1823 16     Temp 11/13/16 1823 98.9  F (37.2 C)     Temp Source 11/13/16 1823 Oral     SpO2 11/13/16 1823 100 %     Weight 11/13/16 1825 180 lb (81.6 kg)     Height 11/13/16 1825  (1.651 m)     Head Circumference --      Peak Flow --      Pain Score --      Pain Loc --      Pain Edu? --      Excl. in GC? --    No data found.   Updated Vital Signs BP 129/82 (BP Location: Left Arm)   Pulse (!) 115   Temp 98.9 F (37.2 C) (Oral)   Resp 16   Ht  (1.651 m)   Wt 180 lb (81.6 kg)   LMP 11/10/2016   SpO2 100%   BMI 29.95 kg/m   Visual Acuity Right Eye Distance:   Left Eye Distance:   Bilateral Distance:    Right Eye Near:   Left Eye Near:     Bilateral Near:     Physical Exam  Constitutional: She is oriented to person, place, and time. She appears well-developed and well-nourished. No distress.  HENT:  Head: Normocephalic.  Right Ear: External ear normal.  Left Ear: External ear normal.  Nose: Nose normal.  Mouth/Throat: Oropharynx is clear and moist. No oropharyngeal exudate.  Eyes: Pupils are equal, round, and reactive to light. Right eye exhibits no discharge. Left eye exhibits no discharge.  Neck: Normal range of motion. Neck supple.  Pulmonary/Chest: Effort normal and breath sounds normal.  Abdominal: Soft. Bowel sounds are normal. She exhibits no distension. There is no tenderness. There is no rebound and no guarding.  Musculoskeletal: Normal range of motion.  Lymphadenopathy:    She has no cervical adenopathy.  Neurological: She is alert and oriented to person, place, and time.  Skin: Skin is warm and dry. She is not diaphoretic.  Psychiatric: She has a normal mood and affect. Her behavior is normal. Judgment and thought content normal.  Nursing note and vitals reviewed.    UC Treatments / Results  Labs (all labs ordered are listed, but only abnormal results are displayed) Labs Reviewed  RAPID STREP SCREEN (NOT AT [facility])  CULTURE, GROUP A STREP [facility])    EKG  EKG Interpretation None       Radiology No results found.  Procedures Procedures (including critical care time)  Medications Ordered in UC Medications - No data to display   Initial Impression / Assessment and Plan / UC Course  I have reviewed the triage vital signs and the nursing notes.  Pertinent labs & imaging results that were available during my care of the patient were reviewed by me and considered in my medical decision making (see chart for details).     Plan: 1. Test/x-ray results and diagnosis reviewed with patient 2. rx as per orders; risks, benefits, potential side effects reviewed with patient 3. Recommend supportive  treatment with maintaining good hydration.use Zofran for nausea. The strep test was negative.  cultures will be obtained in results available in 48 hours.is likely viral but if the cultures return positive will be treated with the penicillin appropriately.if you worsen go to the emergency room. Otherwise recommend following up with your primary care physician 4. F/u prn if symptoms worsen or don't improve   Final Clinical Impressions(s) / UC Diagnoses   Final diagnoses:  Intractable vomiting with nausea, unspecified vomiting type  Diarrhea,  unspecified type    New Prescriptions Discharge Medication List as of 11/13/2016  7:34 PM    START taking these medications   Details  ondansetron (ZOFRAN ODT) 8 MG disintegrating tablet Take 1 tablet (8 mg total) by mouth 2 (two) times daily., Starting Tue 11/13/2016, Normal         Controlled Substance Prescriptions [place] Controlled Substance Registry consulted? Not Applicable   Lutricia Feil, PA-C 11/13/16 1947    Lutricia Feil, PA-C 11/13/16 1948

## 2016-11-13 NOTE — ED Triage Notes (Signed)
Sore throat, abd pain, nausea, and diarrhea since Sunday.

## 2016-11-16 DIAGNOSIS — J069 Acute upper respiratory infection, unspecified: Secondary | ICD-10-CM | POA: Diagnosis not present

## 2016-11-16 LAB — CULTURE, GROUP A STREP (THRC)

## 2016-12-03 ENCOUNTER — Ambulatory Visit
Admission: EM | Admit: 2016-12-03 | Discharge: 2016-12-03 | Disposition: A | Discharge status: Home / Self Care | Payer: BLUE CROSS/BLUE SHIELD | Attending: Family Medicine | Admitting: Family Medicine

## 2016-12-03 DIAGNOSIS — L980 Pyogenic granuloma: Secondary | ICD-10-CM

## 2016-12-03 DIAGNOSIS — L03031 Cellulitis of right toe: Secondary | ICD-10-CM | POA: Diagnosis not present

## 2016-12-03 DIAGNOSIS — L600 Ingrowing nail: Secondary | ICD-10-CM | POA: Diagnosis not present

## 2016-12-03 MED ORDER — CEPHALEXIN 500 MG PO CAPS: 500.0000 mg | ORAL_CAPSULE | Freq: Two times a day (BID) | ORAL | 0 refills | Status: AC

## 2016-12-03 NOTE — ED Provider Notes (Signed)
[facility]    CSN: [id number] Arrival date & time: 12/03/16  1844     History   Chief Complaint Chief Complaint  Patient presents with  . Toe Pain    HPI Kari Frost is a 31 y.o. female.   Patient is a 31 year old female who presents with complaint of right toe pain and a growth. Patient reports she had are the now removed about 6 months ago related to ingrown toenail from poor fitting snow boots. She also had a ingrown nail on the left foot with some relation tissue that was treated by a podiatrist back in September. At that time, podiatrist told her she may have some issues with that right now due to a "chip" or split in that nail. Patient states she is done soaks, peroxide, and Neosporin without much improvement. It is painful to touch.      Past Medical History:  Diagnosis Date  . Thyroid disease   . UTI (urinary tract infection)     Patient Active Problem List   Diagnosis Date Noted  . Displacement of intrauterine contraceptive device 04/10/2016  . Thyroid enlargement 11/30/2014    Past Surgical History:  Procedure Laterality Date  . INTRAUTERINE DEVICE (IUD) INSERTION  2018  . NO PAST SURGERIES      OB History    No data available       Home Medications    Prior to Admission medications   Medication Sig Start Date End Date Taking? Authorizing Provider  cephALEXin (KEFLEX) 500 MG capsule Take 1 capsule (500 mg total) 2 (two) times daily for 7 days by mouth. 12/03/16 12/10/16  Candis SchatzHarris, Magally Vahle D, PA-C  ibuprofen (ADVIL,MOTRIN) 600 MG tablet Take 1 tablet (600 mg total) by mouth every 6 (six) hours as needed for mild pain or cramping. 04/26/16   Conard NovakJackson, Stephen D, MD  levonorgestrel (MIRENA) 20 MCG/24HR IUD 1 each by Intrauterine route once.    [provider]  ondansetron (ZOFRAN ODT) 8 MG disintegrating tablet Take 1 tablet (8 mg total) by mouth 2 (two) times daily. 11/13/16   Lutricia Feiloemer, William P, PA-C    Family History No family  history on file.  Social History Social History   Tobacco Use  . Smoking status: Never Smoker  . Smokeless tobacco: Never Used  Substance Use Topics  . Alcohol use: No  . Drug use: No     Allergies   Patient has no known allergies.   Review of Systems Review of Systems  As noted above in history of present illness. Other systems reviewed and found to be negative.   Physical Exam Triage Vital Signs ED Triage Vitals  Enc Vitals Group     BP 12/03/16 1855 118/77     Pulse Rate 12/03/16 1855 86     Resp --      Temp 12/03/16 1855 98.6 F (37 C)     Temp Source 12/03/16 1855 Oral     SpO2 12/03/16 1855 100 %     Weight 12/03/16 1858 180 lb (81.6 kg)     Height 12/03/16 1858 5\' 5"  (1.651 m)     Head Circumference --      Peak Flow --      Pain Score 12/03/16 1858 7     Pain Loc --      Pain Edu? --      Excl. in GC? --    No data found.  Updated Vital Signs BP 118/77 (BP Location: Left Arm)  Pulse 86   Temp 98.6 F (37 C) (Oral)   Ht 5\' 5"  (1.651 m)   Wt 180 lb (81.6 kg)   LMP 11/10/2016 (Exact Date)   SpO2 100%   BMI 29.95 kg/m   Visual Acuity Right Eye Distance:   Left Eye Distance:   Bilateral Distance:    Right Eye Near:   Left Eye Near:    Bilateral Near:     Physical Exam  Constitutional: She is oriented to person, place, and time. She appears well-developed and well-nourished. No distress.  Pulmonary/Chest: Effort normal. No respiratory distress.  Musculoskeletal:       Right foot: There is tenderness and swelling.       Feet:  Redness and swelling to the distal right great toe with some granulation tissue along the lateral aspect of the nail. Tender to touch. Pertinent fluid expressed palpation.  Neurological: She is alert and oriented to person, place, and time.  Skin: Skin is warm and dry. Capillary refill takes less than 2 seconds.     UC Treatments / Results  Labs (all labs ordered are listed, but only abnormal results are  displayed) Labs Reviewed - No data to display  EKG  EKG Interpretation None       Radiology No results found.  Procedures Procedures (including critical care time)  Medications Ordered in UC Medications - No data to display   Initial Impression / Assessment and Plan / UC Course  I have reviewed the triage vital signs and the nursing notes.  Pertinent labs & imaging results that were available during my care of the patient were reviewed by me and considered in my medical decision making (see chart for details).    Cellulitis with granulation tissue to R toe. Purulent drainage  Final Clinical Impressions(s) / UC Diagnoses   Final diagnoses:  Pyogenic granuloma of skin  Infected nailbed of toe, right  Cellulitis of toe of right foot    ED Discharge Orders        Ordered    cephALEXin (KEFLEX) 500 MG capsule  2 times daily     12/03/16 1920     Will have patient follow up with podiatrist for further treatment.  Controlled Substance Prescriptions [place] Controlled Substance Registry consulted? Not Applicable   Candis Schatz, PA-C 12/03/16 1610

## 2016-12-03 NOTE — Discharge Instructions (Signed)
-  Keflex: one tablet twice a day for 7 days -Soak or warm compresses -elevate -Contact podiatrist for evaluation and treatment of the extra tissue growth

## 2016-12-03 NOTE — ED Triage Notes (Signed)
Patient states she has a growth on her right big toe x 2 weeks. Patient states she had an in grown toe nail removed 4 months ago on same toe. Swelling, pain, redness and drainage x 2 weeks.

## 2017-01-07 DIAGNOSIS — L03031 Cellulitis of right toe: Secondary | ICD-10-CM | POA: Diagnosis not present

## 2017-01-07 DIAGNOSIS — L600 Ingrowing nail: Secondary | ICD-10-CM | POA: Diagnosis not present

## 2017-01-07 DIAGNOSIS — M79674 Pain in right toe(s): Secondary | ICD-10-CM | POA: Diagnosis not present

## 2017-01-10 ENCOUNTER — Ambulatory Visit: Payer: BLUE CROSS/BLUE SHIELD | Admitting: Podiatry

## 2017-02-11 ENCOUNTER — Ambulatory Visit: Payer: BLUE CROSS/BLUE SHIELD | Admitting: Podiatry

## 2017-02-11 ENCOUNTER — Encounter: Payer: Self-pay | Admitting: Podiatry

## 2017-02-11 DIAGNOSIS — L600 Ingrowing nail: Secondary | ICD-10-CM | POA: Diagnosis not present

## 2017-02-11 DIAGNOSIS — L03031 Cellulitis of right toe: Secondary | ICD-10-CM

## 2017-02-11 MED ORDER — CEPHALEXIN 500 MG PO CAPS: 500.0000 mg | ORAL_CAPSULE | Freq: Two times a day (BID) | ORAL | 0 refills | Status: DC

## 2017-02-11 NOTE — Progress Notes (Signed)
This patient presents to the office with chief complaint  of severe pain noted in the big toe of the right foot.  Patient states this was told that Dr. Elijah Birkom perform surgery on and it has become painful along both borders big toe, right foot.  She says it frequently gets stepped on and causes her significant pain and discomfort.  She presents the office today for an evaluation and treatment of this painful ingrowing toenail both borders big toe, right foot   General Appearance  Alert, conversant and in no acute stress.  Vascular  Dorsalis pedis and posterior pulses are palpable  bilaterally.  Capillary return is within normal limits  bilaterally. Temperature is within normal limits  Bilaterally.  Neurologic  Senn-Weinstein monofilament wire test within normal limits  bilaterally. Muscle power within normal limits bilaterally.  Nails marked incurvation noted along the medial and lateral border of the right great nail.  Healing on the lateral border of the left great toe.  Orthopedic  No limitations of motion of motion feet bilaterally.  No crepitus or effusions noted.  No bony pathology or digital deformities noted.  Skin  normotropic skin with no porokeratosis noted bilaterally.  No signs of infections or ulcers noted.  Paronychia right hallux.  ROV  Nail surgery.  .Treatment options and alternatives discussed.  Recommended permanent phenol matrixectomy and patient agreed. Right hallux  was prepped with alcohol and a toe block of 3cc of 2% lidocaine plain was administered in a digital toe block. .  The toe was then prepped with betadine solution .  The offending nail borders were  then excised and matrix tissue exposed.  Phenol was then applied to the matrix tissue both borders  followed by an alcohol wash.  Antibiotic ointment and a dry sterile dressing was applied.  The patient was dispensed instructions for aftercare. Prescribe cephalexin 500 mg.  # 15  RTC 10 days.     Helane GuntherGregory Oda Placke DPM

## 2017-02-25 ENCOUNTER — Ambulatory Visit: Payer: BLUE CROSS/BLUE SHIELD | Admitting: Podiatry

## 2017-05-20 ENCOUNTER — Encounter: Payer: Self-pay | Admitting: Podiatry

## 2017-05-20 ENCOUNTER — Ambulatory Visit: Payer: BLUE CROSS/BLUE SHIELD | Admitting: Podiatry

## 2017-05-20 DIAGNOSIS — L600 Ingrowing nail: Secondary | ICD-10-CM

## 2017-05-20 DIAGNOSIS — L03032 Cellulitis of left toe: Secondary | ICD-10-CM

## 2017-05-20 MED ORDER — CEPHALEXIN 500 MG PO CAPS: 500.0000 mg | ORAL_CAPSULE | Freq: Two times a day (BID) | ORAL | 0 refills | Status: DC

## 2017-05-20 NOTE — Patient Instructions (Signed)

## 2017-05-20 NOTE — Progress Notes (Signed)
This patient presents the office with chief complaint of a painful infected ingrowing toenail outside border big toe left foot.  She says that it has been present for about 2 weeks.  She has tried self treatment at home but the problem persists.  She presents the office today for definitive evaluation and treatment of the infected ingrown toenail left big toe.  General Appearance  Alert, conversant and in no acute stress.  Vascular  Dorsalis pedis and posterior tibial  pulses are palpable  bilaterally.  Capillary return is within normal limits  bilaterally. Temperature is within normal limits  bilaterally.  Neurologic  Senn-Weinstein monofilament wire test within normal limits  bilaterally. Muscle power within normal limits bilaterally.  Nails , marked incurvation noted along the lateral border left great toenail.  Granulation tissue is noted at the distal nail fold lateral border left foot.  Orthopedic  No limitations of motion of motion feet .  No crepitus or effusions noted.  No bony pathology or digital deformities noted.  Skin  normotropic skin with no porokeratosis noted bilaterally.  No signs of infections or ulcers noted.    Ingrown Toenail left foot  Paronychia lateral border left great toenail.  ROV.  Nail surgery.  Treatment options and alternatives discussed.  Recommended permanent phenol matrixectomy and patient agreed.  Left hallux  was prepped with alcohol and a toe block of 3cc of 2% lidocaine plain was administered in a digital toe block. .  The toe was then prepped with betadine solution .  The offending nail border was then excised and matrix tissue exposed.  Phenol was then applied to the matrix tissue followed by an alcohol wash.  Antibiotic ointment and a dry sterile dressing was applied.  The patient was dispensed instructions for aftercare. Cephalexin was prescribed.  RTC 1 week.     Helane GuntherGregory Kary Colaizzi DPM

## 2017-07-31 DIAGNOSIS — L718 Other rosacea: Secondary | ICD-10-CM | POA: Diagnosis not present

## 2017-09-19 ENCOUNTER — Ambulatory Visit: Payer: BLUE CROSS/BLUE SHIELD | Admitting: Family Medicine

## 2017-09-19 ENCOUNTER — Encounter: Payer: Self-pay | Admitting: Family Medicine

## 2017-09-19 VITALS — BP 128/72 | HR 89 | Temp 98.5°F | Resp 16 | Ht 65.0 in | Wt 185.4 lb

## 2017-09-19 DIAGNOSIS — Z13220 Encounter for screening for lipoid disorders: Secondary | ICD-10-CM | POA: Diagnosis not present

## 2017-09-19 DIAGNOSIS — F322 Major depressive disorder, single episode, severe without psychotic features: Secondary | ICD-10-CM | POA: Diagnosis not present

## 2017-09-19 DIAGNOSIS — Z833 Family history of diabetes mellitus: Secondary | ICD-10-CM | POA: Diagnosis not present

## 2017-09-19 DIAGNOSIS — F419 Anxiety disorder, unspecified: Secondary | ICD-10-CM

## 2017-09-19 DIAGNOSIS — Z6830 Body mass index (BMI) 30.0-30.9, adult: Secondary | ICD-10-CM

## 2017-09-19 DIAGNOSIS — R5383 Other fatigue: Secondary | ICD-10-CM | POA: Diagnosis not present

## 2017-09-19 DIAGNOSIS — E6609 Other obesity due to excess calories: Secondary | ICD-10-CM

## 2017-09-19 MED ORDER — HYDROXYZINE HCL 10 MG PO TABS: 10.0000 mg | ORAL_TABLET | Freq: Three times a day (TID) | ORAL | 0 refills | Status: DC | PRN

## 2017-09-19 MED ORDER — ESCITALOPRAM OXALATE 10 MG PO TABS: ORAL_TABLET | ORAL | 0 refills | Status: DC

## 2017-09-19 NOTE — Progress Notes (Signed)
Name: Kari Frost   MRN: [id number]    DOB: May 08, 1985   Date:09/19/2017       Progress Note  Subjective  Chief Complaint  Chief Complaint  Patient presents with  . Establish Care  . Anxiety  . Rash    red patches on her face    HPI  Pt presents to establish care and to discuss depression/anxiety: PHQ-9 score of 20, GAD-7 Score of 17  Husband works 7p-5:30am Monday through Saturday in [place] (works with scaffolding and has to travel for work).  He comes home on Sundays only.  This has been a new change this past year and it has been extremely stressful for her.  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  She has 10yo daughter (starts school next week) and 4yo son (goes to daycare with the patient).  On Saturdays she often lays in bed all day and just can't get up.  She has talked to her husband and he has been somewhat supportive, but not as much as she would have liked - she is only able to talk to him briefly each day.  Her in-laws live next door and she feels like they often question her parenting. Her parents live in [place], Dad is not able to help, but mom is able to help for a few hours a week.  Sister is in college at [employer] but is a good support.  She does endorse intermittent passive suicidal thoughts - denies having them at present. She has never had a suicide attempt in the past.  She denies ever having a suicide plan.  She states she is safe at home, caring for her children, and would never commit suicide or self harm as she could never do this to her children.  She identifies her sister as someone she could go to in a time of crisis - also discussed crisis centers and ER/911 as listed in AVS.  She has never harmed her children, does not have thoughts of harming her children. She denies HI, she denise auditory/visual/tactile hallucination.  She describes panic attacks with difficulty breathing, chest tightness, and crying  spells.  She reports this lasts for a few minutes - tries to calm herself down, washes her face, does some deep breathing.  These occur about 2-3 times a month.   Patient Active Problem List   Diagnosis Date Noted  . Displacement of intrauterine contraceptive device 04/10/2016  . Thyroid enlargement 11/30/2014    Past Surgical History:  Procedure Laterality Date  . INTRAUTERINE DEVICE (IUD) INSERTION  2018  . INTRAUTERINE DEVICE (IUD) INSERTION N/A 04/26/2016   Procedure: INTRAUTERINE DEVICE (IUD) INSERTION;  Surgeon: Conard Novak, MD;  Location: [facility] ORS;  Service: Gynecology;  Laterality: N/A;  lot tu01sce exp 09/012020 mirena  . IUD REMOVAL N/A 04/26/2016   Procedure: INTRAUTERINE DEVICE (IUD) REMOVAL;  Surgeon: Conard Novak, MD;  Location: [facility] ORS;  Service: Gynecology;  Laterality: N/A;  . LAPAROSCOPY N/A 04/26/2016   Procedure: LAPAROSCOPY OPERATIVE;  Surgeon: Conard Novak, MD;  Location: [facility];  Service: Gynecology;  Laterality: N/A;  . NO PAST SURGERIES      No family history on file.  Social History   Socioeconomic History  . Marital status: Married    Spouse name: Daira Hine  . Number of children: 2  . Years of education: Not on file  . Highest education level: Not on file  Occupational History  .  Occupation: Administrator, sportsDaycare worker    Comment: Learning experience  Social Needs  . Financial resource strain: Not hard at all  . Food insecurity:    Worry: Never true    Inability: Never true  . Transportation needs:    Medical: No    Non-medical: No  Tobacco Use  . Smoking status: Never Smoker  . Smokeless tobacco: Never Used  Substance and Sexual Activity  . Alcohol use: No  . Drug use: No  . Sexual activity: Yes    Birth control/protection: IUD  Lifestyle  . Physical activity:    Days per week: 0 days    Minutes per session: 0 min  . Stress: Very much  Relationships  . Social connections:    Talks on phone: More than three times a week     Gets together: More than three times a week    Attends religious service: Never    Active member of club or organization: No    Attends meetings of clubs or organizations: Not on file    Relationship status: Married  . Intimate partner violence:    Fear of current or ex partner: Not on file    Emotionally abused: Not on file    Physically abused: Not on file    Forced sexual activity: Not on file  Other Topics Concern  . Not on file  Social History Narrative  . Not on file     Current Outpatient Medications:  .  ibuprofen (ADVIL,MOTRIN) 600 MG tablet, Take 1 tablet (600 mg total) by mouth every 6 (six) hours as needed for mild pain or cramping., Disp: 30 tablet, Rfl: 0 .  levonorgestrel (MIRENA) 20 MCG/24HR IUD, by Intrauterine route., Disp: , Rfl:   No Known Allergies  ROS  Ten systems reviewed and is negative except as mentioned in HPI.  Objective  Vitals:   09/19/17 1019  BP: 128/72  Pulse: 89  Resp: 16  Temp: 98.5 F (36.9 C)  TempSrc: Oral  SpO2: 97%  Weight: 185 lb 6.4 oz (84.1 kg)  Height: 5\' 5"  (1.651 m)   Body mass index is 30.85 kg/m.  Physical Exam Constitutional: Patient appears well-developed and well-nourished. No distress.  HENT: Head: Normocephalic and atraumatic.  Nose: Nose normal. Mouth/Throat: Oropharynx is clear and moist. No oropharyngeal exudate.  Eyes: Conjunctivae and EOM are normal. Pupils are equal, round, and reactive to light. No scleral icterus.  Neck: Normal range of motion. Neck supple. No JVD present. No thyromegaly present.  Cardiovascular: Normal rate, regular rhythm and normal heart sounds.  No murmur heard. No BLE edema. Pulmonary/Chest: Effort normal and breath sounds normal. No respiratory distress. Abdominal: Soft. Bowel sounds are normal, no distension. There is no tenderness. no masses Musculoskeletal: Normal range of motion, no joint effusions. No gross deformities Neurological: she is alert and oriented to person, place,  and time. No cranial nerve deficit. Coordination, balance, strength, speech and gait are normal.  Skin: Skin is warm and dry. No rash noted. No erythema.  Psychiatric: Patient has an anxious mood and affect. behavior is appropriate for situation. Judgment and thought content are appropriate.   No results found for this or any previous visit (from the past 72 hour(s)).  PHQ2/9: Depression screen PHQ 2/9 09/19/2017  Decreased Interest 2  Down, Depressed, Hopeless 2  PHQ - 2 Score 4  Altered sleeping 3  Tired, decreased energy 3  Change in appetite 3  Feeling bad or failure about yourself  2  Trouble concentrating  2  Moving slowly or fidgety/restless 2  Suicidal thoughts 1  PHQ-9 Score 20  Difficult doing work/chores Extremely dIfficult   GAD 7 : Generalized Anxiety Score 09/19/2017  Nervous, Anxious, on Edge 2  Control/stop worrying 3  Worry too much - different things 3  Trouble relaxing 2  Restless 3  Easily annoyed or irritable 3  Afraid - awful might happen 1  Total GAD 7 Score 17  Anxiety Difficulty Somewhat difficult    Fall Risk: Fall Risk  09/19/2017  Falls in the past year? No   Assessment & Plan  1. Current severe episode of major depressive disorder without psychotic features without prior episode (HCC) - Lengthy discussion per HPI.  See AVS for crisis intervention.  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 - She does verbally contract for safety - she will call 911, present to the ER, or call one of the crisis center phone numbers provided to her if she is having suicidal thoughts - escitalopram (LEXAPRO) 10 MG tablet; Take 1/2 tablet once daily for 3 days, then take 1 tablet once daily  Dispense: 30 tablet; Refill: 0 - hydrOXYzine (ATARAX/VISTARIL) 10 MG tablet; Take 1 tablet (10 mg total) by mouth 3 (three) times daily as needed for anxiety.  Dispense: 10 tablet; Refill: 0 - TSH  2. Fatigue, unspecified type - Likely secondary to immense  stress and depressive symptoms, we will check labs to ensure no other organic cause. - COMPLETE METABOLIC PANEL WITH GFR - CBC w/Diff/Platelet - TSH  3. Class 1 obesity due to excess calories without serious comorbidity with body mass index (BMI) of 30.0 to 30.9 in adult - Discussed importance of 150 minutes of physical activity weekly, eat two servings of fish weekly, eat one serving of tree nuts ( cashews, pistachios, pecans, almonds.Marland Kitchen) every other day, eat 6 servings of fruit/vegetables daily and drink plenty of water and avoid sweet beverages.  - Lipid panel - COMPLETE METABOLIC PANEL WITH GFR - CBC w/Diff/Platelet - TSH  4. Family history of diabetes mellitus - COMPLETE METABOLIC PANEL WITH GFR  5. Lipid screening - Lipid panel  6. Anxiety - See discussion above; discussed setting attainable goals to set boundaries at work and to allow time for herself at home. - escitalopram (LEXAPRO) 10 MG tablet; Take 1/2 tablet once daily for 3 days, then take 1 tablet once daily  Dispense: 30 tablet; Refill: 0 - hydrOXYzine (ATARAX/VISTARIL) 10 MG tablet; Take 1 tablet (10 mg total) by mouth 3 (three) times daily as needed for anxiety.  Dispense: 10 tablet; Refill: 0

## 2017-09-19 NOTE — Patient Instructions (Addendum)
- [email or web address] therapist finder to find a counselor  - Goal #1: Talk to director regarding your hours, reduce to 42-43 hours maximum a week with a goal of coming home before 6pm each night. - Goal #2: Talk to family members to find 1-2 hours a week to take for yourself without kids - get nails done, go shopping, take a bath, RELAX!  Here are some resources to help you if you feel you are in a mental health crisis:  National Suicide Prevention Lifeline - Call 805 156 7448  for help - Website with more resources: ARanked.fi  [facility] - Call [phone]) 544-3355 for help. - Mobile Crisis Program available 24 hours a day, 365 days a year. - Available for anyone of any age in [place] counties.  [facility] - Address: [address], [place] - Telephone: [phone]  - Hours of Operation: Sunday - Saturday - 8:00 a.m. - 8:00 p.m. - Medicaid, Medicare (Government Issued Only), BCBS, and [employer] - Pay - Crisis Management, Outpatient Individual & Group Therapy, Psychiatrists on-site to provide medication management, In-Home Psychiatric Care, and Peer Support Care.  [facility] - Call [phone] for help. - Mobile Crisis Program available 24 hours a day, 365 days a year. - Available for anyone of any age in [place]  12 Ways to Curb Anxiety  ?Anxiety is normal human sensation. It is what helped our ancestors survive the pitfalls of the wilderness. Anxiety is defined as experiencing worry or nervousness about an imminent event or something with an uncertain outcome. It is a feeling experienced by most people at some point in their lives. Anxiety can be triggered by a very personal issue, such as the illness of a loved one, or an event of global proportions, such as a refugee crisis. Some of the symptoms of anxiety are:  Feeling restless.  Having a  feeling of impending danger.  Increased heart rate.  Rapid breathing. Sweating.  Shaking.  Weakness or feeling tired.  Difficulty concentrating on anything except the current worry.  Insomnia.  Stomach or bowel problems. What can we do about anxiety we may be feeling? There are many techniques to help manage stress and relax. Here are 12 ways you can reduce your anxiety almost immediately: 1. Turn off the constant feed of information. Take a social media sabbatical. Studies have shown that social media directly contributes to social anxiety.  2. Monitor your television viewing habits. Are you watching shows that are also contributing to your anxiety, such as 24-hour news stations? Try watching something else, or better yet, nothing at all. Instead, listen to music, read an inspirational book or practice a hobby. 3. Eat nutritious meals. Also, don't skip meals and keep healthful snacks on hand. Hunger and poor diet contributes to feeling anxious. 4. Sleep. Sleeping on a regular schedule for at least seven to eight hours a night will do wonders for your outlook when you are awake. 5. Exercise. Regular exercise will help rid your body of that anxious energy and help you get more restful sleep. 6. Try deep (diaphragmatic) breathing. Inhale slowly through your nose for five seconds and exhale through your mouth. 7. Practice acceptance and gratitude. When anxiety hits, accept that there are things out of your control that shouldn't be of immediate concern.  8. Seek out humor. When anxiety strikes, watch a funny video, read jokes or call a friend who makes you laugh. Laughter is healing for our bodies  and releases endorphins that are calming. 9. Stay positive. Take the effort to replace negative thoughts with positive ones. Try to see a stressful situation in a positive light. Try to come up with solutions rather than dwelling on the problem. 10. Figure out what triggers your anxiety. Keep a journal and  make note of anxious moments and the events surrounding them. This will help you identify triggers you can avoid or even eliminate. 11. Talk to someone. Let a trusted friend, family member or even trained professional know that you are feeling overwhelmed and anxious. Verbalize what you are feeling and why.  12. Volunteer. If your anxiety is triggered by a crisis on a large scale, become an advocate and work to resolve the problem that is causing you unease. Anxiety is often unwelcome and can become overwhelming. If not kept in check, it can become a disorder that could require medical treatment. However, if you take the time to care for yourself and avoid the triggers that make you anxious, you will be able to find moments of relaxation and clarity that make your life much more enjoyable.

## 2017-09-20 LAB — COMPLETE METABOLIC PANEL WITH GFR
AG Ratio: 1.6 (calc) (ref 1.0–2.5)
ALBUMIN MSPROF: 4.3 g/dL (ref 3.6–5.1)
ALKALINE PHOSPHATASE (APISO): 75 U/L (ref 33–115)
ALT: 23 U/L (ref 6–29)
AST: 17 U/L (ref 10–30)
BILIRUBIN TOTAL: 0.5 mg/dL (ref 0.2–1.2)
BUN / CREAT RATIO: 8 (calc) (ref 6–22)
BUN: 6 mg/dL — AB (ref 7–25)
CHLORIDE: 107 mmol/L (ref 98–110)
CO2: 27 mmol/L (ref 20–32)
Calcium: 9.2 mg/dL (ref 8.6–10.2)
Creat: 0.72 mg/dL (ref 0.50–1.10)
GFR, Est African American: 130 mL/min/{1.73_m2} (ref >=60)
GFR, Est Non African American: 112 mL/min/{1.73_m2} (ref >=60)
Globulin: 2.7 g/dL (calc) (ref 1.9–3.7)
Glucose, Bld: 91 mg/dL (ref 65–99)
Potassium: 4.3 mmol/L (ref 3.5–5.3)
SODIUM: 140 mmol/L (ref 135–146)
Total Protein: 7 g/dL (ref 6.1–8.1)

## 2017-09-20 LAB — CBC WITH DIFFERENTIAL/PLATELET
BASOS PCT: 0.4 %
Basophils Absolute: 29 cells/uL (ref 0–200)
EOS PCT: 2.3 %
Eosinophils Absolute: 168 cells/uL (ref 15–500)
HEMATOCRIT: 41 % (ref 35.0–45.0)
HEMOGLOBIN: 13.9 g/dL (ref 11.7–15.5)
LYMPHS ABS: 2300 {cells}/uL (ref 850–3900)
MCH: 29.9 pg (ref 27.0–33.0)
MCHC: 33.9 g/dL (ref 32.0–36.0)
MCV: 88.2 fL (ref 80.0–100.0)
MPV: 9.8 fL (ref 7.5–12.5)
Monocytes Relative: 6.6 %
NEUTROS ABS: 4322 {cells}/uL (ref 1500–7800)
Neutrophils Relative %: 59.2 %
Platelets: 331 10*3/uL (ref 140–400)
RBC: 4.65 10*6/uL (ref 3.80–5.10)
RDW: 12.3 % (ref 11.0–15.0)
Total Lymphocyte: 31.5 %
WBC: 7.3 10*3/uL (ref 3.8–10.8)
WBCMIX: 482 {cells}/uL (ref 200–950)

## 2017-09-20 LAB — LIPID PANEL
CHOL/HDL RATIO: 4.8 (calc) (ref <5.0)
Cholesterol: 178 mg/dL (ref <200)
HDL: 37 mg/dL — ABNORMAL LOW (ref >50)
LDL CHOLESTEROL (CALC): 125 mg/dL — AB
Non-HDL Cholesterol (Calc): 141 mg/dL (calc) — ABNORMAL HIGH (ref <130)
TRIGLYCERIDES: 67 mg/dL (ref <150)

## 2017-09-20 LAB — TSH: TSH: 0.72 mIU/L

## 2017-10-03 ENCOUNTER — Ambulatory Visit: Payer: BLUE CROSS/BLUE SHIELD | Admitting: Family Medicine

## 2017-10-14 ENCOUNTER — Encounter: Payer: Self-pay | Admitting: Family Medicine

## 2017-10-14 ENCOUNTER — Ambulatory Visit (INDEPENDENT_AMBULATORY_CARE_PROVIDER_SITE_OTHER): Payer: BLUE CROSS/BLUE SHIELD | Admitting: Family Medicine

## 2017-10-14 VITALS — BP 110/60 | HR 77 | Temp 98.4°F | Resp 16 | Ht 65.0 in | Wt 188.3 lb

## 2017-10-14 DIAGNOSIS — E7800 Pure hypercholesterolemia, unspecified: Secondary | ICD-10-CM | POA: Diagnosis not present

## 2017-10-14 DIAGNOSIS — E6609 Other obesity due to excess calories: Secondary | ICD-10-CM | POA: Diagnosis not present

## 2017-10-14 DIAGNOSIS — F322 Major depressive disorder, single episode, severe without psychotic features: Secondary | ICD-10-CM

## 2017-10-14 DIAGNOSIS — Z23 Encounter for immunization: Secondary | ICD-10-CM | POA: Diagnosis not present

## 2017-10-14 DIAGNOSIS — Z6830 Body mass index (BMI) 30.0-30.9, adult: Secondary | ICD-10-CM

## 2017-10-14 NOTE — Progress Notes (Signed)
Name: Kari Frost   MRN: [id number]    DOB: 09-16-1985   Date:10/14/2017       Progress Note  Subjective  Chief Complaint  Chief Complaint  Patient presents with  . Follow-up    2 week recheck    HPI  Depression and Anxiety Follow Up: - Did take hydroxyzine once  - Started Lexapro about 4 days ago because she was nervous about the SE's (weight gain, libido). - She has not contacted anyone about counseling. - She has been leaving her kids with her mother in law on occasion to give herself some time alone.  She explained to her in-laws about her anxiety, it's hard for her to give her kids to them during the weekends, but it has been benefiting her mood.  She got a Forensic scientist without her children last weekend and this was very relaxing.  - She has been leaving work by 4:30-5:00pm. - Her sleep is broken up at night - waking up multiple times; not having nightmares.  - Spoke with her husband about her depression and anxiety (he is only home on Sundays); they got a puppy recently (MinPin) that has also been helping her with her mood.  - Talks to her sister each day on her lunch break, she is very supportive.  - Denies SI/HI; 1 panic attack since last visit.  Obesity: She is eating out too much - spending more money than she would like on eating out at lunch.  Goal is to pack her lunch daily.  Discussed increasing exercise as well.  Hyperlipidemia: Maternal grandfather had MI in his 57's when he passed away - he was a smoker and obese.  We discussed statin therapy at a young age, and she declines today.  Will work on lifestyle.   Patient Active Problem List   Diagnosis Date Noted  . Displacement of intrauterine contraceptive device 04/10/2016  . Thyroid enlargement 11/30/2014    Past Surgical History:  Procedure Laterality Date  . INTRAUTERINE DEVICE (IUD) INSERTION  2018  . INTRAUTERINE DEVICE (IUD) INSERTION N/A 04/26/2016   Procedure: INTRAUTERINE DEVICE (IUD) INSERTION;   Surgeon: Conard Novak, MD;  Location: [facility] OR[facility];  Service: Gynecology;  Laterality: N/A;  lot tu01sce exp 09/012020 mirena  . IUD REMOVAL N/A 04/26/2016   Procedure: INTRAUTERINE DEVICE (IUD) REMOVAL;  Surgeon: Conard Novak, MD;  Location: [facility] OR[facility];  Service: Gynecology;  Laterality: N/A;  . LAPAROSCOPY N/A 04/26/2016   Procedure: LAPAROSCOPY OPERATIVE;  Surgeon: Conard Novak, MD;  Location: [facility];  Service: Gynecology;  Laterality: N/A;  . NO PAST SURGERIES      Family History  Problem Relation Age of Onset  . Diabetes Maternal Grandfather     Social History   Socioeconomic History  . Marital status: Married    Spouse name: Kari Frost  . Number of children: 2  . Years of education: Not on file  . Highest education level: Not on file  Occupational History  . Occupation: Administrator, sports    Comment: Learning experience  Social Needs  . Financial resource strain: Not hard at all  . Food insecurity:    Worry: Never true    Inability: Never true  . Transportation needs:    Medical: No    Non-medical: No  Tobacco Use  . Smoking status: Never Smoker  . Smokeless tobacco: Never Used  Substance and Sexual Activity  . Alcohol use: No  . Drug use: No  . Sexual activity: Yes  Birth control/protection: IUD  Lifestyle  . Physical activity:    Days per week: 0 days    Minutes per session: 0 min  . Stress: Very much  Relationships  . Social connections:    Talks on phone: More than three times a week    Gets together: More than three times a week    Attends religious service: Never    Active member of club or organization: No    Attends meetings of clubs or organizations: Not on file    Relationship status: Married  . Intimate partner violence:    Fear of current or ex partner: Not on file    Emotionally abused: Not on file    Physically abused: Not on file    Forced sexual activity: Not on file  Other Topics Concern  . Not on file  Social History  Narrative  . Not on file     Current Outpatient Medications:  .  escitalopram (LEXAPRO) 10 MG tablet, Take 1/2 tablet once daily for 3 days, then take 1 tablet once daily, Disp: 30 tablet, Rfl: 0 .  hydrOXYzine (ATARAX/VISTARIL) 10 MG tablet, Take 1 tablet (10 mg total) by mouth 3 (three) times daily as needed for anxiety., Disp: 10 tablet, Rfl: 0 .  ibuprofen (ADVIL,MOTRIN) 600 MG tablet, Take 1 tablet (600 mg total) by mouth every 6 (six) hours as needed for mild pain or cramping., Disp: 30 tablet, Rfl: 0 .  levonorgestrel (MIRENA) 20 MCG/24HR IUD, by Intrauterine route., Disp: , Rfl:   No Known Allergies  I personally reviewed active problem list, medication list, allergies, family history, social history, health maintenance, notes from last encounter with the patient/caregiver today.   ROS Constitutional: Negative for fever or weight change.  Respiratory: Negative for cough and shortness of breath.   Cardiovascular: Negative for chest pain or palpitations.  Gastrointestinal: Negative for abdominal pain, no bowel changes.  Mild Nausea Musculoskeletal: Negative for gait problem or joint swelling.  Skin: Negative for rash.  Neurological: Negative for dizziness or headache.  No other specific complaints in a complete review of systems (except as listed in HPI above).\  Objective  Vitals:   10/14/17 0816  BP: 110/60  Pulse: 77  Resp: 16  Temp: 98.4 F (36.9 C)  TempSrc: Oral  SpO2: 97%  Weight: 188 lb 4.8 oz (85.4 kg)  Height: 5\' 5"  (1.651 m)   Body mass index is 31.33 kg/m.  Physical Exam Constitutional: Patient appears well-developed and well-nourished. No distress.  HENT: Head: Normocephalic and atraumatic. Eyes: Conjunctivae and EOM are normal. No scleral icterus.  Pupils are equal, round, and reactive to light.  Neck: Normal range of motion. Neck supple. No JVD present. Cardiovascular: Normal rate, regular rhythm and normal heart sounds.  No murmur heard. No BLE  edema. Pulmonary/Chest: Effort normal and breath sounds normal. No respiratory distress. Musculoskeletal: Normal range of motion, no joint effusions. No gross deformities Neurological: Pt is alert and oriented to person, place, and time. No cranial nerve deficit. Coordination, balance, strength, speech and gait are normal.  Skin: Skin is warm and dry. No rash noted. No erythema.  Psychiatric: Patient has a normal mood and affect. behavior is normal. Judgment and thought content normal.  No results found for this or any previous visit (from the past 72 hour(s)).   PHQ2/9: Depression screen [facility] 2/9 10/14/2017 09/19/2017  Decreased Interest 0 2  Down, Depressed, Hopeless 1 2  PHQ - 2 Score 1 4  Altered sleeping 3  3  Tired, decreased energy 0 3  Change in appetite 3 3  Feeling bad or failure about yourself  0 2  Trouble concentrating 1 2  Moving slowly or fidgety/restless 0 2  Suicidal thoughts 0 1  PHQ-9 Score 8 20  Difficult doing work/chores Not difficult at all Extremely dIfficult   Fall Risk: Fall Risk  10/14/2017 09/19/2017  Falls in the past year? No No    Assessment & Plan  1. Current severe episode of major depressive disorder without psychotic features without prior episode (HCC) - Improving, continue lexapro; hydroxyzine PRN; find a counselor by next visit.  2. Class 1 obesity due to excess calories without serious comorbidity with body mass index (BMI) of 30.0 to 30.9 in adult - Discussed importance of 150 minutes of physical activity weekly, eat two servings of fish weekly, eat one serving of tree nuts ( cashews, pistachios, pecans, almonds.Marland Kitchen.) every other day, eat 6 servings of fruit/vegetables daily and drink plenty of water and avoid sweet beverages.  - Decrease eating out, start packing lunches  3. Pure hypercholesterolemia - Dietary changes discussed  4. Needs flu shot - Flu Vaccine QUAD 6+ mos PF IM (Fluarix Quad PF)  5. Need for Tdap vaccination - Tdap vaccine  greater than or equal to 7yo IM

## 2017-10-14 NOTE — Patient Instructions (Addendum)
Please look for counselor -  [email or web address] Therapist finder.  GOALS :  #1 - Pack lunches #2 - Find a counselor

## 2017-10-19 ENCOUNTER — Other Ambulatory Visit: Payer: Self-pay | Admitting: Family Medicine

## 2017-10-19 DIAGNOSIS — F419 Anxiety disorder, unspecified: Secondary | ICD-10-CM

## 2017-10-19 DIAGNOSIS — F322 Major depressive disorder, single episode, severe without psychotic features: Secondary | ICD-10-CM

## 2017-11-03 ENCOUNTER — Ambulatory Visit
Admission: EM | Admit: 2017-11-03 | Discharge: 2017-11-03 | Disposition: A | Discharge status: Home / Self Care | Payer: BLUE CROSS/BLUE SHIELD | Attending: Family Medicine | Admitting: Family Medicine

## 2017-11-03 DIAGNOSIS — J101 Influenza due to other identified influenza virus with other respiratory manifestations: Secondary | ICD-10-CM | POA: Diagnosis not present

## 2017-11-03 DIAGNOSIS — J028 Acute pharyngitis due to other specified organisms: Secondary | ICD-10-CM | POA: Diagnosis not present

## 2017-11-03 LAB — RAPID STREP SCREEN (MED CTR MEBANE ONLY): Streptococcus, Group A Screen (Direct): NEGATIVE

## 2017-11-03 LAB — RAPID INFLUENZA A&B ANTIGENS (ARMC ONLY): INFLUENZA B (ARMC): NEGATIVE

## 2017-11-03 LAB — RAPID INFLUENZA A&B ANTIGENS: Influenza A (ARMC): POSITIVE — AB

## 2017-11-03 MED ORDER — LIDOCAINE VISCOUS HCL 2 % MT SOLN: 10.0000 mL | OROMUCOSAL | 0 refills | Status: DC | PRN

## 2017-11-03 MED ORDER — OSELTAMIVIR PHOSPHATE 75 MG PO CAPS: 75.0000 mg | ORAL_CAPSULE | Freq: Two times a day (BID) | ORAL | 0 refills | Status: AC

## 2017-11-03 MED ORDER — IBUPROFEN 800 MG PO TABS
800.0000 mg | ORAL_TABLET | Freq: Once | ORAL | Status: AC
  Administered 2017-11-03: 800 mg via ORAL

## 2017-11-03 NOTE — ED Provider Notes (Signed)
[facility]    CSN: [id number] Arrival date & time: 11/03/17  1032     History   Chief Complaint Chief Complaint  Patient presents with  . Sore Throat    HPI Kari Frost is a 32 y.o. female.   32 year old female presents with sore throat, body aches, fever, chills and decreased appetite for the past 48 hours. Denies any nasal congestion, cough or GI symptoms. Has taken Advil about 8 hours ago with minimal relief. 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 Other chronic health issues include depression and usually takes Lexapro daily but has not taken the past 2 days due to illness.   The history is provided by the patient.    Past Medical History:  Diagnosis Date  . Thyroid disease   . UTI (urinary tract infection)     Patient Active Problem List   Diagnosis Date Noted  . 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 Class 1 obesity due to excess calories without serious comorbidity with body mass index (BMI) of 30.0 to 30.9 in adult 10/14/2017  . Pure hypercholesterolemia 10/14/2017  . Displacement of intrauterine contraceptive device 04/10/2016  . Thyroid enlargement 11/30/2014    Past Surgical History:  Procedure Laterality Date  . INTRAUTERINE DEVICE (IUD) INSERTION  2018  . INTRAUTERINE DEVICE (IUD) INSERTION N/A 04/26/2016   Procedure: INTRAUTERINE DEVICE (IUD) INSERTION;  Surgeon: Conard Novak, MD;  Location: [facility];  Service: Gynecology;  Laterality: N/A;  lot tu01sce exp 09/012020 mirena  . IUD REMOVAL N/A 04/26/2016   Procedure: INTRAUTERINE DEVICE (IUD) REMOVAL;  Surgeon: Conard Novak, MD;  Location: [facility];  Service: Gynecology;  Laterality: N/A;  . LAPAROSCOPY N/A 04/26/2016   Procedure: LAPAROSCOPY OPERATIVE;  Surgeon: Conard Novak, MD;  Location: [facility];  Service: Gynecology;  Laterality: N/A;  . NO PAST SURGERIES      OB  History   None      Home Medications    Prior to Admission medications   Medication Sig Start Date End Date Taking? Authorizing Provider  escitalopram (LEXAPRO) 10 MG tablet TAKE 1/2 TABLET BY MOUTH EVERY DAY FOR 3 DAYS THEN TAKE 1 TABLET BY MOUTH EVERY DAY 10/20/17  Yes Doren Custard, FNP  hydrOXYzine (ATARAX/VISTARIL) 10 MG tablet Take 1 tablet (10 mg total) by mouth 3 (three) times daily as needed for anxiety. 09/19/17  Yes Doren Custard, FNP  levonorgestrel (MIRENA) 20 MCG/24HR IUD by Intrauterine route.   Yes [provider]  ibuprofen (ADVIL,MOTRIN) 600 MG tablet Take 1 tablet (600 mg total) by mouth every 6 (six) hours as needed for mild pain or cramping. 04/26/16   Conard Novak, MD  lidocaine (XYLOCAINE) 2 % solution Use as directed 10 mLs in the mouth or throat every 4 (four) hours as needed for mouth pain (and throat pain- swish and spit out). 11/03/17   Sudie Grumbling, NP  oseltamivir (TAMIFLU) 75 MG capsule Take 1 capsule (75 mg total) by mouth every 12 (twelve) hours for 5 days. 11/03/17 11/08/17  Sudie Grumbling, NP    Family History Family History  Problem Relation Age of Onset  . Diabetes Maternal Grandfather     Social History Social History   Tobacco Use  . Smoking status: Never Smoker  . Smokeless tobacco: Never Used  Substance Use Topics  . Alcohol use: No  . Drug use:  No     Allergies   Patient has no known allergies.   Review of Systems Review of Systems  Constitutional: Positive for activity change, appetite change, chills, fatigue and fever.  HENT: Positive for postnasal drip, sore throat and trouble swallowing. Negative for congestion, ear discharge, ear pain, facial swelling, mouth sores, nosebleeds, rhinorrhea, sinus pressure, sinus pain and sneezing.   Eyes: Negative for pain, discharge, redness and itching.  Respiratory: Negative for cough, chest tightness, shortness of breath and wheezing.   Gastrointestinal: Negative for  abdominal pain, diarrhea, nausea and vomiting.  Musculoskeletal: Positive for arthralgias and myalgias. Negative for back pain, neck pain and neck stiffness.  Skin: Negative for color change, rash and wound.  Allergic/Immunologic: Negative for immunocompromised state.  Neurological: Positive for headaches. Negative for dizziness, tremors, seizures, syncope, weakness, light-headedness and numbness.  Hematological: Negative for adenopathy. Does not bruise/bleed easily.     Physical Exam Triage Vital Signs ED Triage Vitals  Enc Vitals Group     BP 11/03/17 1049 121/77     Pulse Rate 11/03/17 1049 94     Resp 11/03/17 1049 18     Temp 11/03/17 1049 100.3 F (37.9 C)     Temp Source 11/03/17 1049 Oral     SpO2 11/03/17 1049 100 %     Weight 11/03/17 1051 185 lb (83.9 kg)     Height --      Head Circumference --      Peak Flow --      Pain Score 11/03/17 1051 8     Pain Loc --      Pain Edu? --      Excl. in GC? --    No data found.  Updated Vital Signs BP 121/77 (BP Location: Right Arm)   Pulse 94   Temp 100.3 F (37.9 C) (Oral)   Resp 18   Wt 185 lb (83.9 kg)   SpO2 100%   BMI 30.79 kg/m   Visual Acuity Right Eye Distance:   Left Eye Distance:   Bilateral Distance:    Right Eye Near:   Left Eye Near:    Bilateral Near:     Physical Exam  Constitutional: She is oriented to person, place, and time. She appears well-developed and well-nourished. She is cooperative. She appears ill. No distress.  Patient sitting in exam chair, wrapped in a blanket, in no acute distress but appears ill and shaking.   HENT:  Head: Normocephalic and atraumatic.  Right Ear: Hearing, tympanic membrane, external ear and ear canal normal.  Left Ear: Hearing, tympanic membrane, external ear and ear canal normal.  Nose: Rhinorrhea present. Right sinus exhibits no maxillary sinus tenderness and no frontal sinus tenderness. Left sinus exhibits no maxillary sinus tenderness and no frontal sinus  tenderness.  Mouth/Throat: Uvula is midline and mucous membranes are normal. No oral lesions. Posterior oropharyngeal edema and posterior oropharyngeal erythema present. No oropharyngeal exudate.  Eyes: Conjunctivae and EOM are normal.  Neck: Normal range of motion. Neck supple.  Cardiovascular: Normal rate, regular rhythm and normal heart sounds.  No murmur heard. Pulmonary/Chest: Effort normal and breath sounds normal. No respiratory distress. She has no decreased breath sounds. She has no wheezes. She has no rhonchi.  Musculoskeletal: Normal range of motion.  Lymphadenopathy:       Head (right side): Tonsillar adenopathy present.       Head (left side): Tonsillar adenopathy present.    She has cervical adenopathy.  Right cervical: Superficial cervical adenopathy present.       Left cervical: Superficial cervical adenopathy present.  Neurological: She is alert and oriented to person, place, and time.  Skin: Skin is warm and dry. Capillary refill takes less than 2 seconds. No rash noted.  Psychiatric: She has a normal mood and affect. Her behavior is normal. Judgment and thought content normal.  Vitals reviewed.    UC Treatments / Results  Labs (all labs ordered are listed, but only abnormal results are displayed) Labs Reviewed  RAPID INFLUENZA A&B ANTIGENS ([facility] ONLY) - Abnormal; Notable for the following components:      Result Value   Influenza A ([facility]) POSITIVE (*)    All other components within normal limits  RAPID STREP SCREEN ([facility] ONLY)  CULTURE, GROUP A STREP [facility])    EKG None  Radiology No results found.  Procedures Procedures (including critical care time)  Medications Ordered in UC Medications  ibuprofen (ADVIL,MOTRIN) tablet 800 mg (800 mg Oral Given 11/03/17 1153)    Initial Impression / Assessment and Plan / UC Course  I have reviewed the triage vital signs and the nursing notes.  Pertinent labs & imaging results that were available  during my care of the patient were reviewed by me and considered in my medical decision making (see chart for details).    Reviewed negative rapid strep test with patient. Reviewed positive influenza A result- discussed viral illness. Patient has had influenza before and has taken Tamiflu with no side effects- requests Rx today. Will start Tamiflu 75mg  twice a day as directed. May use Viscous lidocaine 2 teaspoons every 4 hours as needed for throat pain. Continue Ibuprofen 600mg  every 6 hours as needed for pain and fever. Continue to push fluids and eat soft foods. Note written for work. Follow-up in 3 days if not improving.   Final Clinical Impressions(s) / UC Diagnoses   Final diagnoses:  Influenza A  Pharyngitis due to other organism     Discharge Instructions     Recommend start Tamiflu 75mg  twice a day as directed. May use Viscous Lidocaine solution- take 2 teaspoons and swish and spit out every 4 hours as needed for throat pain. Continue Ibuprofen 600mg  every 6 hours as needed for pain and fever. Encouraged to push fluids and eat soft foods. Follow-up in 3 days if not improving.     ED Prescriptions    Medication Sig Dispense Auth. Provider   oseltamivir (TAMIFLU) 75 MG capsule Take 1 capsule (75 mg total) by mouth every 12 (twelve) hours for 5 days. 10 capsule Sudie Grumbling, NP   lidocaine (XYLOCAINE) 2 % solution Use as directed 10 mLs in the mouth or throat every 4 (four) hours as needed for mouth pain (and throat pain- swish and spit out). 100 mL Sudie Grumbling, NP     Controlled Substance Prescriptions [place] Controlled Substance Registry consulted? Not Applicable   Sudie Grumbling, NP 11/03/17 1446

## 2017-11-03 NOTE — ED Triage Notes (Signed)
Pt here for sore throat, body aches, fever, unable to eat and has been laying around since Friday. Has been taking advil but then fever returns. Last dose was 2:30 this morning.

## 2017-11-03 NOTE — Discharge Instructions (Addendum)
Recommend start Tamiflu 75mg  twice a day as directed. May use Viscous Lidocaine solution- take 2 teaspoons and swish and spit out every 4 hours as needed for throat pain. Continue Ibuprofen 600mg  every 6 hours as needed for pain and fever. Encouraged to push fluids and eat soft foods. Follow-up in 3 days if not improving.

## 2017-11-06 LAB — CULTURE, GROUP A STREP (THRC)

## 2018-01-17 ENCOUNTER — Encounter: Payer: BLUE CROSS/BLUE SHIELD | Admitting: Family Medicine

## 2018-01-24 ENCOUNTER — Telehealth: Payer: Self-pay

## 2018-01-24 NOTE — Telephone Encounter (Signed)
Reviewed chart. Pt hasn't been seen since surgery 03/2016. Advised can still have period on Mirena. It is often situational related to stress or sudden weight gain/loss. Advised of bleeding protocol, report to ER if filling a regular size pad & having to change more than one time per hour. Advised pt to schedule appt to discuss if u/s is needed to check placement of IUD and also to get up to date on physical exam.

## 2018-01-24 NOTE — Telephone Encounter (Signed)
Pt had surgery March 2018 for IUD. She had her period for 3 wks last month & it stopped & it's coming on again. Pt inquiring if she should be concerned. [phone]

## 2018-01-29 HISTORY — PX: OTHER SURGICAL HISTORY: SHX169

## 2018-02-11 ENCOUNTER — Encounter: Payer: Self-pay | Admitting: Obstetrics and Gynecology

## 2018-02-11 ENCOUNTER — Ambulatory Visit (INDEPENDENT_AMBULATORY_CARE_PROVIDER_SITE_OTHER): Payer: BLUE CROSS/BLUE SHIELD | Admitting: Obstetrics and Gynecology

## 2018-02-11 ENCOUNTER — Other Ambulatory Visit (HOSPITAL_COMMUNITY)
Admission: RE | Admit: 2018-02-11 | Discharge: 2018-02-11 | Disposition: A | Discharge status: Home / Self Care | Payer: BLUE CROSS/BLUE SHIELD | Source: Ambulatory Visit | Attending: Obstetrics and Gynecology | Admitting: Obstetrics and Gynecology

## 2018-02-11 VITALS — BP 118/76 | Ht 65.0 in | Wt 188.0 lb

## 2018-02-11 DIAGNOSIS — Z124 Encounter for screening for malignant neoplasm of cervix: Secondary | ICD-10-CM | POA: Diagnosis not present

## 2018-02-11 DIAGNOSIS — Z01419 Encounter for gynecological examination (general) (routine) without abnormal findings: Secondary | ICD-10-CM | POA: Insufficient documentation

## 2018-02-11 DIAGNOSIS — Z113 Encounter for screening for infections with a predominantly sexual mode of transmission: Secondary | ICD-10-CM | POA: Insufficient documentation

## 2018-02-11 DIAGNOSIS — Z1331 Encounter for screening for depression: Secondary | ICD-10-CM

## 2018-02-11 DIAGNOSIS — T8332XD Displacement of intrauterine contraceptive device, subsequent encounter: Secondary | ICD-10-CM

## 2018-02-11 DIAGNOSIS — Z1339 Encounter for screening examination for other mental health and behavioral disorders: Secondary | ICD-10-CM

## 2018-02-11 NOTE — Progress Notes (Signed)
Gynecology Annual Exam  PCP: Doren Custard, FNP  Chief Complaint  Patient presents with  . Follow-up    IUD follow up  . Menstrual Problem   History of Present Illness:  Ms. Kari Frost is a 33 y.o. [id number] who LMP was Patient's last menstrual period was 01/26/2018., presents today for her annual examination.  She has an IUD.  She has a history of a uterine perforation with her IUD and laparoscopy for replacement.  Prior to the past summer her menses have been on-and-off. She would go a few months without a period, then would have one that would last 2-3 days and would be heavy.  Since the summer she has had more cramping every few weeks or so.  The cramps were severe and would make her stop what she was doing.  She states that her bleeding lasted all of November and December (off and on).  Given her history, she is concerned. She would be OK as long as her IUD is in the right location.   She is single partner, contraception - IUD.  Last Pap: 2014  Results were: no abnormalities /neg HPV DNA not done Hx of STDs: HSV  There is no FH of breast cancer. There is no FH of ovarian cancer. The patient does not do self-breast exams.  Tobacco use: The patient denies current or previous tobacco use. Alcohol use: social drinker Exercise: not active  The patient wears seatbelts: yes.   The patient reports that domestic violence in her life is absent.   Past Medical History:  Diagnosis Date  . Thyroid disease   . UTI (urinary tract infection)     Past Surgical History:  Procedure Laterality Date  . INTRAUTERINE DEVICE (IUD) INSERTION  2018  . INTRAUTERINE DEVICE (IUD) INSERTION N/A 04/26/2016   Procedure: INTRAUTERINE DEVICE (IUD) INSERTION;  Surgeon: Conard Novak, MD;  Location: [facility];  Service: Gynecology;  Laterality: N/A;  lot tu01sce exp 09/012020 mirena  . IUD REMOVAL N/A 04/26/2016   Procedure: INTRAUTERINE DEVICE (IUD) REMOVAL;  Surgeon: Conard Novak, MD;  Location:  [facility];  Service: Gynecology;  Laterality: N/A;  . LAPAROSCOPY N/A 04/26/2016   Procedure: LAPAROSCOPY OPERATIVE;  Surgeon: Conard Novak, MD;  Location: [facility];  Service: Gynecology;  Laterality: N/A;    Prior to Admission medications   Medication Sig Start Date End Date Taking? Authorizing Provider  levonorgestrel (MIRENA) 20 MCG/24HR IUD by Intrauterine route.   Yes [provider]   Allergies: No Known Allergies  Obstetric History: S8P1031, s/p SVD x 2  Social History   Socioeconomic History  . Marital status: Married    Spouse name: Giavanna Kerby  . Number of children: 2  . Years of education: Not on file  . Highest education level: Not on file  Occupational History  . Occupation: Administrator, sports    Comment: Learning experience  Social Needs  . Financial resource strain: Not hard at all  . Food insecurity:    Worry: Never true    Inability: Never true  . Transportation needs:    Medical: No    Non-medical: No  Tobacco Use  . Smoking status: Never Smoker  . Smokeless tobacco: Never Used  Substance and Sexual Activity  . Alcohol use: No  . Drug use: No  . Sexual activity: Yes    Birth control/protection: I.U.D.  Lifestyle  . Physical activity:    Days per week: 0 days    Minutes per session: 0  min  . Stress: Very much  Relationships  . Social connections:    Talks on phone: More than three times a week    Gets together: More than three times a week    Attends religious service: Never    Active member of club or organization: No    Attends meetings of clubs or organizations: Not on file    Relationship status: Married  . Intimate partner violence:    Fear of current or ex partner: Not on file    Emotionally abused: Not on file    Physically abused: Not on file    Forced sexual activity: Not on file  Other Topics Concern  . Not on file  Social History Narrative  . Not on file    Family History  Problem Relation Age of Onset  . Diabetes  Maternal Grandfather     Review of Systems  Constitutional: Negative.   HENT: Negative.   Eyes: Negative.   Respiratory: Negative.   Cardiovascular: Negative.   Gastrointestinal: Negative.   Genitourinary: Negative.   Musculoskeletal: Negative.   Skin: Negative.   Neurological: Negative.   Psychiatric/Behavioral: Negative.      Physical Exam BP 118/76   Ht 5\' 5"  (1.651 m)   Wt 188 lb (85.3 kg)   LMP 01/26/2018   BMI 31.28 kg/m    Physical Exam Constitutional:      General: She is not in acute distress.    Appearance: She is well-developed.  Genitourinary:     Pelvic exam was performed with patient supine.     Vulva, urethra, bladder and uterus normal.     No inguinal adenopathy present in the right or left side.    No signs of injury in the vagina.     No vaginal discharge, erythema, tenderness or bleeding.     No cervical motion tenderness, discharge, lesion or polyp.     IUD strings visualized.     Uterus is mobile.     Uterus is not enlarged or tender.     No uterine mass detected.    Uterus is anteverted.     No right or left adnexal mass present.     Right adnexa not tender or full.     Left adnexa not tender or full.  HENT:     Head: Normocephalic and atraumatic.  Eyes:     General: No scleral icterus.    Conjunctiva/sclera: Conjunctivae normal.  Neck:     Musculoskeletal: Normal range of motion and neck supple.     Thyroid: No thyromegaly.  Cardiovascular:     Rate and Rhythm: Normal rate and regular rhythm.     Heart sounds: No murmur. No friction rub. No gallop.   Pulmonary:     Effort: Pulmonary effort is normal. No respiratory distress.     Breath sounds: Normal breath sounds. No wheezing or rales.  Chest:     Breasts:        Right: No inverted nipple, mass, nipple discharge, skin change or tenderness.        Left: No inverted nipple, mass, nipple discharge, skin change or tenderness.  Abdominal:     General: Bowel sounds are normal. There is  no distension.     Palpations: Abdomen is soft. There is no mass.     Tenderness: There is no abdominal tenderness. There is no guarding or rebound.  Musculoskeletal: Normal range of motion.        General: No tenderness.  Lymphadenopathy:  Cervical: No cervical adenopathy.     Lower Body: No right inguinal adenopathy. No left inguinal adenopathy.  Neurological:     General: No focal deficit present.     Mental Status: She is alert and oriented to person, place, and time.     Cranial Nerves: No cranial nerve deficit.  Skin:    General: Skin is warm and dry.     Findings: No erythema or rash.  Psychiatric:        Mood and Affect: Mood normal.        Behavior: Behavior normal.        Judgment: Judgment normal.    Female chaperone present for pelvic and breast  portions of the physical exam  Results: AUDIT Questionnaire (screen for alcoholism): 1 PHQ-9: 4   Assessment: 33 y.o. [id number] female here for routine annual gynecologic examination  Plan: Problem List Items Addressed This Visit      Genitourinary   Displacement of intrauterine contraceptive device   Relevant Orders   US PELVIS TRANSVANGINAL NON-OB (TV ONLY)    Other Visit Diagnoses    Women's annual routine gynecological examination    -  Primary   Relevant Orders   US PELVIS TRANSVANGINAL NON-OB (TV ONLY)   Cytology - PAP   Screening for depression       Screening for alcoholism       Pap smear for cervical cancer screening       Relevant Orders   Cytology - PAP   Screen for STD (sexually transmitted disease)       Relevant Orders   Cytology - PAP      Screening: -- Blood pressure screen normal -- Weight screening: overweight: continue to monitor -- Depression screening negative (PHQ-9) -- Nutrition: normal -- cholesterol screening: per PCP -- osteoporosis screening: not due -- tobacco screening: not using -- alcohol screening: AUDIT questionnaire indicates low-risk usage. -- family history of  breast cancer screening: done. not at high risk. -- no evidence of domestic violence or intimate partner violence. -- STD screening: gonorrhea/chlamydia NAAT collected -- pap smear collected per ASCCP guidelines -- HPV vaccination series: not eligilbe  Thomasene MohairStephen Geremy Rister, MD 02/11/2018 11:29 AM

## 2018-02-12 LAB — CYTOLOGY - PAP
CHLAMYDIA, DNA PROBE: NEGATIVE
DIAGNOSIS: NEGATIVE
HPV: NOT DETECTED
NEISSERIA GONORRHEA: NEGATIVE

## 2018-02-21 ENCOUNTER — Ambulatory Visit (INDEPENDENT_AMBULATORY_CARE_PROVIDER_SITE_OTHER): Payer: BLUE CROSS/BLUE SHIELD | Admitting: Obstetrics and Gynecology

## 2018-02-21 ENCOUNTER — Encounter: Payer: Self-pay | Admitting: Obstetrics and Gynecology

## 2018-02-21 ENCOUNTER — Ambulatory Visit (INDEPENDENT_AMBULATORY_CARE_PROVIDER_SITE_OTHER): Payer: BLUE CROSS/BLUE SHIELD

## 2018-02-21 VITALS — BP 122/74 | Ht 65.0 in

## 2018-02-21 DIAGNOSIS — T8332XD Displacement of intrauterine contraceptive device, subsequent encounter: Secondary | ICD-10-CM

## 2018-02-21 DIAGNOSIS — Z30432 Encounter for removal of intrauterine contraceptive device: Secondary | ICD-10-CM

## 2018-02-21 DIAGNOSIS — Z3046 Encounter for surveillance of implantable subdermal contraceptive: Secondary | ICD-10-CM | POA: Diagnosis not present

## 2018-02-21 DIAGNOSIS — Z30017 Encounter for initial prescription of implantable subdermal contraceptive: Secondary | ICD-10-CM

## 2018-02-21 DIAGNOSIS — Z01419 Encounter for gynecological examination (general) (routine) without abnormal findings: Secondary | ICD-10-CM

## 2018-02-21 MED ORDER — ETONOGESTREL 68 MG ~~LOC~~ IMPL: 1.0000 | DRUG_IMPLANT | Freq: Once | SUBCUTANEOUS | 0 refills | Status: DC

## 2018-02-21 NOTE — Progress Notes (Signed)
Gynecology Ultrasound Follow Up  Chief Complaint:  Chief Complaint  Patient presents with  . Follow-up   History of Present Illness: Patient is a 33 y.o. female who presents today for ultrasound evaluation of displacement of intrauterine contraceptive device.  Ultrasound demonstrates the following findings Adnexa: no masses seen  Uterus: anteverted with endometrial stripe  3.4 mm Additional: IUD is seen within lower uterine segment.  Patient would like a Nexplanon instead after removal of her IUD.   Past Medical History:  Diagnosis Date  . Thyroid disease   . UTI (urinary tract infection)     Past Surgical History:  Procedure Laterality Date  . INTRAUTERINE DEVICE (IUD) INSERTION  2018  . INTRAUTERINE DEVICE (IUD) INSERTION N/A 04/26/2016   Procedure: INTRAUTERINE DEVICE (IUD) INSERTION;  Surgeon: Conard Novak, MD;  Location: [facility];  Service: Gynecology;  Laterality: N/A;  lot tu01sce exp 09/012020 mirena  . IUD REMOVAL N/A 04/26/2016   Procedure: INTRAUTERINE DEVICE (IUD) REMOVAL;  Surgeon: Conard Novak, MD;  Location: [facility];  Service: Gynecology;  Laterality: N/A;  . LAPAROSCOPY N/A 04/26/2016   Procedure: LAPAROSCOPY OPERATIVE;  Surgeon: Conard Novak, MD;  Location: [facility];  Service: Gynecology;  Laterality: N/A;     Family History  Problem Relation Age of Onset  . Diabetes Maternal Grandfather     Social History   Socioeconomic History  . Marital status: Married    Spouse name: Roya Kaschak  . Number of children: 2  . Years of education: Not on file  . Highest education level: Not on file  Occupational History  . Occupation: Administrator, sports    Comment: Learning experience  Social Needs  . Financial resource strain: Not hard at all  . Food insecurity:    Worry: Never true    Inability: Never true  . Transportation needs:    Medical: No    Non-medical: No  Tobacco Use  . Smoking status: Never Smoker  . Smokeless tobacco: Never  Used  Substance and Sexual Activity  . Alcohol use: No  . Drug use: No  . Sexual activity: Yes    Birth control/protection: I.U.D.  Lifestyle  . Physical activity:    Days per week: 0 days    Minutes per session: 0 min  . Stress: Very much  Relationships  . Social connections:    Talks on phone: More than three times a week    Gets together: More than three times a week    Attends religious service: Never    Active member of club or organization: No    Attends meetings of clubs or organizations: Not on file    Relationship status: Married  . Intimate partner violence:    Fear of current or ex partner: Not on file    Emotionally abused: Not on file    Physically abused: Not on file    Forced sexual activity: Not on file  Other Topics Concern  . Not on file  Social History Narrative  . Not on file    No Known Allergies  Prior to Admission medications   Medication Sig Start Date End Date Taking? Authorizing Provider  levonorgestrel (MIRENA) 20 MCG/24HR IUD by Intrauterine route.    [provider]    Physical Exam BP 122/74   Ht 5\' 5"  (1.651 m)   LMP 01/26/2018   BMI 31.28 kg/m    General: NAD HEENT: normocephalic, anicteric Pulmonary: No increased work of breathing Extremities: no edema, erythema, or  tenderness Neurologic: Grossly intact, normal gait Psychiatric: mood appropriate, affect full  IUD Removal  Patient identified, informed consent performed, consent signed.  Patient was in the dorsal lithotomy position, normal external genitalia was noted.  A speculum was placed in the patient's vagina, normal discharge was noted, no lesions. The cervix was visualized, no lesions, no abnormal discharge.  The strings of the IUD were grasped and pulled using ring forceps. The IUD was removed in its entirety. Patient tolerated the procedure well.    Patient will use Nexplanon for contraception.   GYNECOLOGY PROCEDURE NOTE  Patient is a 33 y.o. [id number]  presenting for Nexplanon insertion as her desires means of contraception.  She provided informed consent, signed copy in the chart, time out was performed. Pregnancy test was not done, with self reported LMP of Patient's last menstrual period was 01/26/2018.  She understands that Nexplanon is a progesterone only therapy, and that patients often patients have irregular and unpredictable vaginal bleeding or amenorrhea. She understands that other side effects are possible related to systemic progesterone, including but not limited to, headaches, breast tenderness, nausea, and irritability. While effective at preventing pregnancy long acting reversible contraceptives do not prevent transmission of sexually transmitted diseases and use of barrier methods for this purpose was discussed. The placement procedure for Nexplanon was reviewed with the patient in detail including risks of nerve injury, infection, bleeding and injury to other muscles or tendons. She understands that the Nexplanon implant is good for 3 years and needs to be removed at the end of that time.  She understands that Nexplanon is an extremely effective option for contraception, with failure rate of <1%. This information is reviewed today and all questions were answered. Informed consent was obtained, both verbally and written.   The patient is healthy and has no contraindications to Nexplanon use. Urine pregnancy test was performed today and was negative.  Procedure Appropriate time out taken.  Patient placed in dorsal supine with left arm above head, elbow flexed at 90 degrees, arm resting on examination table with hand behind her head.  The bicipital grove was palpated and site 8-10cm proximal to the medial epicondyle was indentified.  Per the manufacturer's recommendations, the insertion site was marked along a line 3-5 cm posterior (toward the triceps) to the bicipital groove and at 8-10 cm medial to the medial epicondyle. The insertion site  was prepped with a two betadine swabs and then injected with 3 cc of 1% lidocaine without epinephrine.  Nexplanon removed form sterile blister packaging,  Device confirmed in needle, before inserting full length of needle, tenting up the skin as the needle was advance.  The drug eluting rod was then deployed by pulling back the slider per the manufactures recommendation.  The implant was palpable by the clinician as well as the patient.  The insertion site covered dressed with a 1/2" steri-strip before applying  a kerlex bandage pressure dressing..Minimal blood loss was noted during the procedure.  The patient tolerated the procedure well.   She was instructed to wear the bandage for 24 hours, call with any signs of infection.  She was given the Nexplanon card and instructed to have the rod removed in 3 years.  Assessment: 33 y.o. [id number]  1. Displacement of intrauterine contraceptive device, subsequent encounter   2. Encounter for IUD removal   3. Nexplanon insertion      Plan: Problem List Items Addressed This Visit      Genitourinary   Displacement of intrauterine contraceptive  device - Primary    Other Visit Diagnoses    Encounter for IUD removal       Nexplanon insertion       Relevant Medications   etonogestrel (NEXPLANON) 68 MG IMPL implant     Discussed in detail findings on ultrasound. 20 minutes spent in face to face discussion with > 50% spent in counseling,management, and coordination of care of her contraceptive counseling with decision made to remove IUD and have Nexplanon placed as she has had two IUDs, both with complications.  [name], MD, [facility], [facility] 02/21/2018 1:29 PM

## 2018-04-28 ENCOUNTER — Other Ambulatory Visit: Payer: Self-pay

## 2018-04-28 ENCOUNTER — Ambulatory Visit
Admission: EM | Admit: 2018-04-28 | Discharge: 2018-04-28 | Disposition: A | Discharge status: Home / Self Care | Payer: BLUE CROSS/BLUE SHIELD | Attending: Family Medicine | Admitting: Family Medicine

## 2018-04-28 ENCOUNTER — Encounter: Payer: Self-pay | Admitting: Emergency Medicine

## 2018-04-28 DIAGNOSIS — M545 Low back pain, unspecified: Secondary | ICD-10-CM

## 2018-04-28 DIAGNOSIS — R300 Dysuria: Secondary | ICD-10-CM

## 2018-04-28 LAB — URINALYSIS, COMPLETE (UACMP) WITH MICROSCOPIC
Bilirubin Urine: NEGATIVE
GLUCOSE, UA: NEGATIVE mg/dL
Ketones, ur: NEGATIVE mg/dL
Leukocytes,Ua: NEGATIVE
Nitrite: NEGATIVE
PH: 5.5 (ref 5.0–8.0)
Protein, ur: NEGATIVE mg/dL

## 2018-04-28 MED ORDER — CYCLOBENZAPRINE HCL 10 MG PO TABS: 10.0000 mg | ORAL_TABLET | Freq: Two times a day (BID) | ORAL | 0 refills | Status: DC | PRN

## 2018-04-28 MED ORDER — SULFAMETHOXAZOLE-TRIMETHOPRIM 800-160 MG PO TABS: 1.0000 | ORAL_TABLET | Freq: Two times a day (BID) | ORAL | 0 refills | Status: AC

## 2018-04-28 NOTE — ED Triage Notes (Signed)
Patient c/o low back pain that started last night. Patient denies injury. States she has been taking Tylenol, using ice and heat with no relief. She denies urinary symptoms.

## 2018-04-28 NOTE — Discharge Instructions (Addendum)
Take medication as prescribed. Rest. Drink plenty of fluids. Stretch. Alternate heat and ice. Over the counter ibuprofen.   Follow up with your primary care physician this week as needed. Return to Urgent care for new or worsening concerns.

## 2018-04-28 NOTE — ED Provider Notes (Signed)
[facility] ____________________________________________  Time seen: Approximately 7:15 PM  I have reviewed the triage vital signs and the nursing notes.   HISTORY  Chief Complaint Back Pain   HPI Kari Frost is a 33 y.o. female presenting for evaluation of left lower back pain that started last night.  Denies any fall, direct injury or known trigger.  Has had accompanying urinary frequency, but no burning with urination.  Denies vaginal complaints.  Denies current pregnancy.  States pain is aching and catching, particularly with movement.  Has continued remain active ambulatory.  Denies urinary or bowel retention or incontinence.  Denies paresthesias or pain radiation.  Denies any accompanying abdominal pain.  No fevers.  Denies recent sickness.  Took 1 dose of Tylenol this morning without any improvement.  Denies other alleviating measures attempted.  Reports otherwise doing well denies recent sickness.  No LMP recorded. (Menstrual status: IUD).Denies pregnancy.     Past Medical History:  Diagnosis Date  . Thyroid disease   . UTI (urinary tract infection)     Patient Active Problem List   Diagnosis Date Noted  . 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 Class 1 obesity due to excess calories without serious comorbidity with body mass index (BMI) of 30.0 to 30.9 in adult 10/14/2017  . Pure hypercholesterolemia 10/14/2017  . Displacement of intrauterine contraceptive device 04/10/2016  . Thyroid enlargement 11/30/2014    Past Surgical History:  Procedure Laterality Date  . INTRAUTERINE DEVICE (IUD) INSERTION  2018  . INTRAUTERINE DEVICE (IUD) INSERTION N/A 04/26/2016   Procedure: INTRAUTERINE DEVICE (IUD) INSERTION;  Surgeon: Conard NovakStephen D Jackson, MD;  Location: [facility];  Service: Gynecology;  Laterality: N/A;  lot tu01sce exp 09/012020 mirena  . IUD REMOVAL N/A 04/26/2016   Procedure: INTRAUTERINE DEVICE (IUD) REMOVAL;  Surgeon: Conard NovakStephen D Jackson, MD;  Location: [facility];  Service: Gynecology;  Laterality: N/A;  . LAPAROSCOPY N/A 04/26/2016   Procedure: LAPAROSCOPY OPERATIVE;  Surgeon: Conard NovakStephen D Jackson, MD;  Location: [facility];  Service: Gynecology;  Laterality: N/A;     No current facility-administered medications for this encounter.   Current Outpatient Medications:  .  etonogestrel (NEXPLANON) 68 MG IMPL implant, 1 each (68 mg total) by Subdermal route once for 1 dose., Disp: 1 each, Rfl: 0 .  cyclobenzaprine (FLEXERIL) 10 MG tablet, Take 1 tablet (10 mg total) by mouth 2 (two) times daily as needed for muscle spasms (back pain). Do not drive while taking as can cause drowsiness, Disp: 15 tablet, Rfl: 0 .  sulfamethoxazole-trimethoprim (BACTRIM DS,SEPTRA DS) 800-160 MG tablet, Take 1 tablet by mouth 2 (two) times daily for 3 days., Disp: 6 tablet, Rfl: 0  Allergies Patient has no known allergies.  Family History  Problem Relation Age of Onset  . Diabetes Maternal Grandfather     Social History Social History   Tobacco Use  . Smoking status: Never Smoker  . Smokeless tobacco: Never Used  Substance Use Topics  . Alcohol use: No  . Drug use: No    Review of Systems Constitutional: No fever Cardiovascular: Denies chest pain. Respiratory: Denies shortness of breath. Gastrointestinal: No abdominal pain.  No nausea, no vomiting.  No diarrhea.  No constipation. Genitourinary: As above.  Musculoskeletal: Positive for back pain. Skin: Negative for rash.   ____________________________________________   PHYSICAL EXAM:  VITAL SIGNS: ED Triage Vitals  Enc Vitals Group     BP 04/28/18 1810 (!) 120/97  Pulse Rate 04/28/18 1810 (!) 104     Resp 04/28/18 1810 18     Temp 04/28/18 1810 98.6 F (37 C)     Temp Source 04/28/18 1810 Oral     SpO2 04/28/18 1810 100 %     Weight 04/28/18 1808 180 lb (81.6 kg)     Height 04/28/18 1808 5\' 5"  (1.651  m)     Head Circumference --      Peak Flow --      Pain Score 04/28/18 1808 8     Pain Loc --      Pain Edu? --      Excl. in GC? --     Constitutional: Alert and oriented. Well appearing and in no acute distress. ENT      Head: Normocephalic and atraumatic. Cardiovascular: Normal rate, regular rhythm. Grossly normal heart sounds.  Good peripheral circulation. Respiratory: Normal respiratory effort without tachypnea nor retractions. Breath sounds are clear and equal bilaterally. No wheezes, rales, rhonchi. Gastrointestinal: Soft and nontender.  No CVA tenderness. Musculoskeletal: No midline cervical or thoracic tenderness palpation.  Minimal midline lower lumbar tenderness palpation.  Mild to moderate left para lumbar tenderness to palpation, no rash, no ecchymosis, pain fully reproducible with right and left lumbar rotation as well as flexion and extension but full range of motion present, no saddle anesthesia, steady gait, changes positions quickly in room, no pain with standing bilateral knee lifts. Neurologic:  Normal speech and language. No gross focal neurologic deficits are appreciated. Speech is normal. No gait instability.  Skin:  Skin is warm, dry and intact. No rash noted. Psychiatric: Mood and affect are normal. Speech and behavior are normal. Patient exhibits appropriate insight and judgment   ___________________________________________   LABS (all labs ordered are listed, but only abnormal results are displayed)  Labs Reviewed  URINALYSIS, COMPLETE (UACMP) WITH MICROSCOPIC - Abnormal; Notable for the following components:      Result Value   APPearance HAZY (*)    Specific Gravity, Urine >1.030 (*)    Hgb urine dipstick TRACE (*)    Bacteria, UA FEW (*)    All other components within normal limits  URINE CULTURE   ____________________________________________   PROCEDURES Procedures    INITIAL IMPRESSION / ASSESSMENT AND PLAN / ED COURSE  Pertinent labs &  imaging results that were available during my care of the patient were reviewed by me and considered in my medical decision making (see chart for details).  Well-appearing patient.  No acute distress.  Denies trauma.  Suspect musculoskeletal strain.  Patient has had increased urinary frequency and reviewed urinalysis, discussed not clear UTI.  We will culture urine and treat with 3-day course of Bactrim.  States that she has home naproxen that she can take as needed, directed to take naproxen per label instruction as well as PRN Flexeril.  Stretch, ice, heat, supportive care.Discussed indication, risks and benefits of medications with patient.  Discussed follow up with Primary care physician this week. Discussed follow up and return parameters including no resolution or any worsening concerns. Patient verbalized understanding and agreed to plan.   ____________________________________________   FINAL CLINICAL IMPRESSION(S) / ED DIAGNOSES  Final diagnoses:  Acute left-sided low back pain without sciatica  Dysuria     ED Discharge Orders         Ordered    sulfamethoxazole-trimethoprim (BACTRIM DS,SEPTRA DS) 800-160 MG tablet  2 times daily     04/28/18 1903    cyclobenzaprine (FLEXERIL) 10  MG tablet  2 times daily PRN     04/28/18 1903           Note: This dictation was prepared with Dragon dictation along with smaller phrase technology. Any transcriptional errors that result from this process are unintentional.         Renford Dills, NP 04/28/18 1931

## 2018-04-30 LAB — URINE CULTURE

## 2018-06-27 ENCOUNTER — Ambulatory Visit: Payer: BLUE CROSS/BLUE SHIELD | Admitting: Family Medicine

## 2018-06-27 ENCOUNTER — Encounter: Payer: Self-pay | Admitting: Family Medicine

## 2018-06-27 ENCOUNTER — Other Ambulatory Visit: Payer: Self-pay

## 2018-06-27 ENCOUNTER — Telehealth: Payer: Self-pay | Admitting: Emergency Medicine

## 2018-06-27 VITALS — BP 120/76 | HR 60 | Temp 98.2°F | Resp 16 | Ht 65.0 in | Wt 194.2 lb

## 2018-06-27 DIAGNOSIS — F39 Unspecified mood [affective] disorder: Secondary | ICD-10-CM | POA: Diagnosis not present

## 2018-06-27 DIAGNOSIS — F322 Major depressive disorder, single episode, severe without psychotic features: Secondary | ICD-10-CM

## 2018-06-27 MED ORDER — QUETIAPINE FUMARATE 100 MG PO TABS: ORAL_TABLET | ORAL | 1 refills | Status: DC

## 2018-06-27 NOTE — Telephone Encounter (Signed)
Copied from CRM [id number]. Topic: Quick Communication - Rx Refill/Question >> Jun 27, 2018  1:24 PM Crist Infante wrote: Medication: QUEtiapine (SEROQUEL) 100 MG tablet Pt seen today and prescribed this med.  Pt would like to discuss this med before she picks it up. Pt  state she read about it and looks like it is for bi polor, but she feels she is just depressed.  Also read it can cause  Weight gain.  Please call.

## 2018-06-27 NOTE — Patient Instructions (Addendum)
Please seek counseling.   Here are some resources to help you if you feel you are in a mental health crisis:  National Suicide Prevention Lifeline - Call 431-374-9676  for help - Website with more resources: ARanked.fi  [employer] [facility] - Call (332)610-4974 for help. - Mobile Crisis Program available 24 hours a day, 365 days a year. - Available for anyone of any age in [place] & [place] counties.  [facility] - Address: [address], [place] - Telephone: [phone]  - Hours of Operation: Sunday - Saturday - 8:00 a.m. - 8:00 p.m. - Medicaid, Medicare (Government Issued Only), BCBS, and [employer] - Pay - Crisis Management, Outpatient Individual & Group Therapy, Psychiatrists on-site to provide medication management, In-Home Psychiatric Care, and Peer Support Care.  [facility] - Call [phone] for help. - Mobile Crisis Program available 24 hours a day, 365 days a year. - Available for anyone of any age in [place] & [facility]

## 2018-06-27 NOTE — Progress Notes (Signed)
Name: Kari Frost   MRN: [id number]    DOB: [birth year]   Date:06/27/2018       Progress Note  Subjective  Chief Complaint  Chief Complaint  Patient presents with  . Depression    follow up    HPI  PT presents with concern for depression  She notes a few weeks of feeling fine, then feeling very down again. She does note that during her good weeks she is spending more money than usual, sometimes hypersexual.  She notes difficulty sleeping - getting up, eating a snack, trying to go back to sleep, etc.  She is taking 3 hour nap during the day then.  She recently lost her job due to COVID-19 - she is taking care of her daughter at home.  She is struggling with crying spells. Denies anxiety. Husband is currently living in [place] due to Holiday representative jobs since November 2019. She was started on medication for depression back in August 2019, but did not like how this made her feel.  Denies SI/HI - has had some passive thoughts in the past.  She notes family history of mother with bipolar.  MDQ is negative, however PHQ-9 and GAD-7 are quite elevated and her description of symptoms are concerning for alternative mood disorder.    Office Visit from 06/27/2018 in [facility]  PHQ-9 Total Score  18     GAD 7 : Generalized Anxiety Score 06/27/2018 10/14/2017 09/19/2017  Nervous, Anxious, on Edge 1 1 2   Control/stop worrying 3 1 3   Worry too much - different things 2 1 3   Trouble relaxing 2 1 2   Restless 3 1 3   Easily annoyed or irritable 2 1 3   Afraid - awful might happen 2 1 1   Total GAD 7 Score 15 7 17   Anxiety Difficulty Very difficult Not difficult at all Somewhat difficult   Patient Active Problem List   Diagnosis Date Noted  . 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 Class 1 obesity due to excess calories without serious comorbidity with body mass index (BMI) of 30.0 to 30.9 in adult  10/14/2017  . Pure hypercholesterolemia 10/14/2017  . Displacement of intrauterine contraceptive device 04/10/2016  . Thyroid enlargement 11/30/2014    Social History   Tobacco Use  . Smoking status: Never Smoker  . Smokeless tobacco: Never Used  Substance Use Topics  . Alcohol use: No     Current Outpatient Medications:  .  cyclobenzaprine (FLEXERIL) 10 MG tablet, Take 1 tablet (10 mg total) by mouth 2 (two) times daily as needed for muscle spasms (back pain). Do not drive while taking as can cause drowsiness (Patient not taking: Reported on 06/27/2018), Disp: 15 tablet, Rfl: 0 .  etonogestrel (NEXPLANON) 68 MG IMPL implant, 1 each (68 mg total) by Subdermal route once for 1 dose., Disp: 1 each, Rfl: 0  No Known Allergies  I personally reviewed active problem list, medication list, allergies, lab results with the patient/caregiver today.  ROS  Constitutional: Negative for fever or weight change.  Respiratory: Negative for cough and shortness of breath.   Cardiovascular: Negative for chest pain or palpitations.  Gastrointestinal: Negative for abdominal pain, no bowel changes.  Musculoskeletal: Negative for gait problem or joint swelling.  Skin: Negative for rash.  Neurological: Negative for dizziness or headache.  No other specific complaints in a complete review of systems (except as listed in HPI above).   Objective  Vitals:   06/27/18 0940  BP: 120/76  Pulse: 60  Resp: 16  Temp: 98.2 F (36.8 C)  TempSrc: Oral  SpO2: 95%  Weight: 194 lb 3.2 oz (88.1 kg)  Height: 5\' 5"  (1.651 m)    Body mass index is 32.32 kg/m.  Nursing Note and Vital Signs reviewed.  Physical Exam  Constitutional: Patient appears well-developed and well-nourished. No distress.  HENT: Head: Normocephalic and atraumatic. Ears: bilateral TMs with no erythema or effusion; Nose: Nose normal. Mouth/Throat: Oropharynx is clear and moist. No oropharyngeal exudate or tonsillar swelling.  Eyes:  Conjunctivae and EOM are normal. No scleral icterus.  Pupils are equal, round, and reactive to light.  Neck: Normal range of motion. Neck supple. No JVD present. No thyromegaly present.  Cardiovascular: Normal rate, regular rhythm and normal heart sounds.  No murmur heard. No BLE edema. Pulmonary/Chest: Effort normal and breath sounds normal. No respiratory distress. Musculoskeletal: Normal range of motion, no joint effusions. No gross deformities Neurological: Pt is alert and oriented to person, place, and time. No cranial nerve deficit. Coordination, balance, strength, speech and gait are normal.  Skin: Skin is warm and dry. No rash noted. No erythema.  Psychiatric: Patient has a normal mood and affect. behavior is normal. Judgment and thought content normal.  No results found for this or any previous visit (from the past 72 hour(s)).  Assessment & Plan  1. Mood disorder (HCC) - MDQ is negative, however PHQ-9 and GAD-7 are quite elevated/positive and her description of symptoms are concerning for alternative mood disorder. - QUEtiapine (SEROQUEL) 100 MG tablet; Take 1/2 tablet once daily at night for 7 days, then increase to 1 tablet once daily at night  Dispense: 30 tablet; Refill: 1 - Ambulatory referral to Psychiatry  -Red flags and when to present for emergency care or RTC including fever >101.69F, chest pain, shortness of breath, new/worsening/un-resolving symptoms, reviewed with patient at time of visit. Follow up and care instructions discussed and provided in AVS.

## 2018-06-27 NOTE — Telephone Encounter (Signed)
Spoke with patient regarding medication, other uses aside from bipolar and schizoaffective disorder.  She will start medication, all questions are answered.

## 2018-06-27 NOTE — Telephone Encounter (Signed)
Please call patient when free

## 2018-07-25 ENCOUNTER — Ambulatory Visit: Payer: BLUE CROSS/BLUE SHIELD | Admitting: Family Medicine

## 2018-08-06 DIAGNOSIS — A6000 Herpesviral infection of urogenital system, unspecified: Secondary | ICD-10-CM

## 2018-08-08 ENCOUNTER — Other Ambulatory Visit: Payer: BLUE CROSS/BLUE SHIELD

## 2018-08-08 ENCOUNTER — Other Ambulatory Visit: Payer: Self-pay

## 2018-08-15 ENCOUNTER — Other Ambulatory Visit: Payer: BLUE CROSS/BLUE SHIELD

## 2018-10-01 ENCOUNTER — Other Ambulatory Visit: Payer: Self-pay | Admitting: Family Medicine

## 2018-10-01 DIAGNOSIS — F39 Unspecified mood [affective] disorder: Secondary | ICD-10-CM

## 2018-10-01 NOTE — Telephone Encounter (Signed)
She was referred to psychiatry - they should be refilling her seroquel.  Has she seen psychiatry yet?

## 2018-10-12 ENCOUNTER — Other Ambulatory Visit: Payer: Self-pay | Admitting: Family Medicine

## 2018-10-12 DIAGNOSIS — F39 Unspecified mood [affective] disorder: Secondary | ICD-10-CM

## 2018-10-12 NOTE — Telephone Encounter (Signed)
Please schedule follow up appointment.  Please let patient know that I have declined her seroquel because she has not returned in about 4 months and only had a 60 day supply of the medication.  She should not restart this medication without a follow up with me to discuss the medication.

## 2018-10-13 NOTE — Telephone Encounter (Signed)
Made patient appt virtual

## 2018-10-14 NOTE — Telephone Encounter (Signed)
Called patient and she stated she has appointment this week and will discuss at that time

## 2018-10-15 ENCOUNTER — Other Ambulatory Visit: Payer: Self-pay

## 2018-10-15 ENCOUNTER — Ambulatory Visit: Payer: BLUE CROSS/BLUE SHIELD | Admitting: Family Medicine

## 2018-11-26 ENCOUNTER — Ambulatory Visit (INDEPENDENT_AMBULATORY_CARE_PROVIDER_SITE_OTHER): Payer: BLUE CROSS/BLUE SHIELD | Admitting: Obstetrics and Gynecology

## 2018-11-26 ENCOUNTER — Other Ambulatory Visit: Payer: Self-pay

## 2018-11-26 ENCOUNTER — Encounter: Payer: Self-pay | Admitting: Obstetrics and Gynecology

## 2018-11-26 VITALS — BP 128/79 | HR 91 | Ht 65.0 in | Wt 198.0 lb

## 2018-11-26 DIAGNOSIS — A6000 Herpesviral infection of urogenital system, unspecified: Secondary | ICD-10-CM

## 2018-11-26 MED ORDER — VALACYCLOVIR HCL 1 G PO TABS: 1000.0000 mg | ORAL_TABLET | Freq: Every day | ORAL | 3 refills | Status: AC

## 2018-11-26 NOTE — Progress Notes (Signed)
Obstetrics & Gynecology Office Visit   Chief Complaint  Patient presents with  . Sore on vaginal are    herpes outbreak, dx'd at [facility] years ago   History of Present Illness: [name] who presents with sores on her bottom.  She was diagnosed with genital herpes four-to-five years ago. She hasn't had much of an issue with them since that time.  Yesterday and today she notes sores on her bottom.  Wiping hurts. Cleaning herself hurts.  She had some mild prodromal symptoms.  She doesn't remember what she has taken for this before.  She has no trouble voiding.  She has a Nexplanon for contraception.    She states that the pain is mostly on her left side of her vulva.  Denies injury to this area.    Past Medical History:  Diagnosis Date  . Genital herpes   . Thyroid disease   . UTI (urinary tract infection)     Past Surgical History:  Procedure Laterality Date  . INTRAUTERINE DEVICE (IUD) INSERTION  2018  . INTRAUTERINE DEVICE (IUD) INSERTION N/A 04/26/2016   Procedure: INTRAUTERINE DEVICE (IUD) INSERTION;  Surgeon: Will Bonnet, MD;  Location: [facility];  Service: Gynecology;  Laterality: N/A;  lot tu01sce exp 09/012020 mirena  . IUD REMOVAL N/A 04/26/2016   Procedure: INTRAUTERINE DEVICE (IUD) REMOVAL;  Surgeon: Will Bonnet, MD;  Location: [facility];  Service: Gynecology;  Laterality: N/A;  . LAPAROSCOPY N/A 04/26/2016   Procedure: LAPAROSCOPY OPERATIVE;  Surgeon: Will Bonnet, MD;  Location: [facility];  Service: Gynecology;  Laterality: N/A;  . nexplanon  2020    Gynecologic History: No LMP recorded. Patient has had an implant.  Obstetric History: [id number]  Family History  Problem Relation Age of Onset  . Diabetes Maternal Grandfather     Social History   Socioeconomic History  . Marital status: Married    Spouse name: Kari Frost  . Number of children: 2  . Years of education: Not on file  . Highest education level: Not on file  Occupational  History  . Occupation: Hotel manager    Comment: Unemployed since [place]  . Financial resource strain: Not hard at all  . Food insecurity    Worry: Never true    Inability: Never true  . Transportation needs    Medical: No    Non-medical: No  Tobacco Use  . Smoking status: Never Smoker  . Smokeless tobacco: Never Used  Substance and Sexual Activity  . Alcohol use: No  . Drug use: No  . Sexual activity: Yes    Birth control/protection: Implant  Lifestyle  . Physical activity    Days per week: 0 days    Minutes per session: 0 min  . Stress: Very much  Relationships  . Social connections    Talks on phone: More than three times a week    Gets together: More than three times a week    Attends religious service: Never    Active member of club or organization: No    Attends meetings of clubs or organizations: Not on file    Relationship status: Married  . Intimate partner violence    Fear of current or ex partner: No    Emotionally abused: No    Physically abused: No    Forced sexual activity: No  Other Topics Concern  . Not on file  Social History Narrative   Out of work due to [id number]  No Known Allergies  Prior to Admission medications   Medication Sig Start Date End Date Taking? Authorizing Provider  etonogestrel (NEXPLANON) 68 MG IMPL implant 1 each (68 mg total) by Subdermal route once for 1 dose. 02/21/18 04/28/18  Conard Novak, MD    Review of Systems  Constitutional: Negative.  Negative for chills and fever.  HENT: Negative.   Eyes: Negative.   Respiratory: Negative.   Cardiovascular: Negative.   Gastrointestinal: Negative.   Genitourinary: Negative.  Negative for dysuria, flank pain, frequency, hematuria and urgency.  Musculoskeletal: Negative.   Skin: Negative.   Neurological: Negative.   Psychiatric/Behavioral: Negative.      Physical Exam BP 128/79 (BP Location: Left Arm, Patient Position: Sitting, Cuff Size: Normal)    Pulse 91   Ht 5\' 5"  (1.651 m)   Wt 198 lb (89.8 kg)   BMI 32.95 kg/m  No LMP recorded. Patient has had an implant. Physical Exam Constitutional:      General: She is not in acute distress.    Appearance: Normal appearance.  Genitourinary:     Pelvic exam was performed with patient in the lithotomy position.     Inguinal canal, urethra and vagina normal.     Vulval lesion, tenderness and ulcerations present.     No vulval condylomata, Bartholin's cyst or rash noted.     No signs of labial injury.  No labial fusion.    Posterior fourchette tenderness and lesion present.     No posterior fourchette injury or rash present.     No urethral swelling or tenderness present.        Cervical lesion (lesion on anterior lip of cervix) present.     No cervical discharge, friability, bleeding, polyp or nabothian cyst.     Genitourinary Comments: Bimanual deferred given discomfort  HENT:     Head: Normocephalic and atraumatic.  Eyes:     General: No scleral icterus.    Conjunctiva/sclera: Conjunctivae normal.  Neurological:     General: No focal deficit present.     Mental Status: She is alert and oriented to person, place, and time.     Cranial Nerves: No cranial nerve deficit.  Psychiatric:        Mood and Affect: Mood normal.        Behavior: Behavior normal.        Judgment: Judgment normal.     Female chaperone present for pelvic and breast  portions of the physical exam  Assessment: 33 y.o. G25P2002 female here for  1. Herpes simplex infection of genitourinary system      Plan: Problem List Items Addressed This Visit    None    Visit Diagnoses    Herpes simplex infection of genitourinary system    -  Primary   Relevant Medications   valACYclovir (VALTREX) 1000 MG tablet     This appears to be an outbreak of genital HSV, though the posterior fourchette lesion is not 100% consistent-appearing with HSV.  There could be a coalescence of lesions in that area. There does  appear to be a lesion on the cervix, as well, though this was not visualized optimally due to patient discomfort. Treat with Valtrex. Also, recommend sitz bath tid, prn.    15 minutes spent in face to face discussion with > 50% spent in counseling,management, and coordination of care of her herpes simplex infection of genitourinary system.   G1P80, MD 11/26/2018 5:05 PM

## 2019-05-21 ENCOUNTER — Ambulatory Visit: Payer: Self-pay | Admitting: Obstetrics and Gynecology

## 2019-06-24 ENCOUNTER — Telehealth: Payer: Self-pay

## 2019-06-24 NOTE — Telephone Encounter (Signed)
I'd be willing.  She might need to come in for some of the biometrics, though.

## 2019-06-24 NOTE — Telephone Encounter (Signed)
Patient recently aquired a job with [employer]. She has a Health Assessment Form she needs completed and doesn't have a PCP. Inquiring if [facility] is able/willing to complete this for her. [phone]

## 2019-06-25 NOTE — Telephone Encounter (Signed)
Voicemail box is full - unable to leave message

## 2019-06-25 NOTE — Telephone Encounter (Signed)
Can yo uplease get pt scheduled for a nurse visit? She needs height, weight, BP done for this form she is wanting signed

## 2019-09-14 ENCOUNTER — Ambulatory Visit
Admission: RE | Admit: 2019-09-14 | Discharge: 2019-09-14 | Disposition: A | Discharge status: Home / Self Care | Payer: Self-pay | Source: Ambulatory Visit | Attending: Emergency Medicine | Admitting: Emergency Medicine

## 2019-09-14 ENCOUNTER — Other Ambulatory Visit: Payer: Self-pay

## 2019-09-14 VITALS — BP 112/76 | HR 94 | Temp 98.5°F | Resp 18

## 2019-09-14 DIAGNOSIS — Z20822 Contact with and (suspected) exposure to covid-19: Secondary | ICD-10-CM

## 2019-09-14 DIAGNOSIS — R059 Cough, unspecified: Secondary | ICD-10-CM

## 2019-09-14 DIAGNOSIS — R05 Cough: Secondary | ICD-10-CM

## 2019-09-14 NOTE — Discharge Instructions (Signed)
Your COVID test is pending.  You should self quarantine until the test result is back.    Take Tylenol as needed for fever or discomfort.  Rest and keep yourself hydrated.    Go to the emergency department if you develop acute worsening symptoms.     

## 2019-09-14 NOTE — ED Triage Notes (Signed)
Patient complains of cough, body aches, and fever since Saturday evening. Patients husband tested positive for COVID on Friday; patient was with patient on Friday.

## 2019-09-14 NOTE — ED Provider Notes (Signed)
Renaldo Fiddler    CSN: [id number] Arrival date & time: 09/14/19  1149      History   Chief Complaint Chief Complaint  Patient presents with  . Cough  . Fever  . Generalized Body Aches    HPI Kari Frost is a 34 y.o. female.   Patient presents with 3-day history of fever, chills, body aches, nonproductive cough, fatigue.  T-max 100.  She denies rash, sore throat, shortness of breath, abdominal pain, vomiting, diarrhea, or other symptoms.  Treatment at home with Tylenol.  Her husband tested COVID positive on 09/11/2019.    The history is provided by the patient.    Past Medical History:  Diagnosis Date  . Genital herpes   . Thyroid disease   . UTI (urinary tract infection)     Patient Active Problem List   Diagnosis Date Noted  . Mood disorder (HCC) 06/27/2018  . 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 Class 1 obesity due to excess calories without serious comorbidity with body mass index (BMI) of 30.0 to 30.9 in adult 10/14/2017  . Pure hypercholesterolemia 10/14/2017  . Displacement of intrauterine contraceptive device 04/10/2016  . Thyroid enlargement 11/30/2014  . Genital herpes simplex type 2 10/12/2014    Past Surgical History:  Procedure Laterality Date  . INTRAUTERINE DEVICE (IUD) INSERTION  2018  . INTRAUTERINE DEVICE (IUD) INSERTION N/A 04/26/2016   Procedure: INTRAUTERINE DEVICE (IUD) INSERTION;  Surgeon: Conard Novak, MD;  Location: [facility];  Service: Gynecology;  Laterality: N/A;  lot tu01sce exp 09/012020 mirena  . IUD REMOVAL N/A 04/26/2016   Procedure: INTRAUTERINE DEVICE (IUD) REMOVAL;  Surgeon: Conard Novak, MD;  Location: [facility];  Service: Gynecology;  Laterality: N/A;  . LAPAROSCOPY N/A 04/26/2016   Procedure: LAPAROSCOPY OPERATIVE;  Surgeon: Conard Novak, MD;  Location: [facility];  Service: Gynecology;  Laterality: N/A;  . nexplanon  2020     OB History    Gravida  2   Para  2   Term  2   Preterm      AB      Living  2     SAB      TAB      Ectopic      Multiple      Live Births  2            Home Medications    Prior to Admission medications   Medication Sig Start Date End Date Taking? Authorizing Provider  etonogestrel (NEXPLANON) 68 MG IMPL implant 1 each (68 mg total) by Subdermal route once for 1 dose. 02/21/18 04/28/18  Conard Novak, MD    Family History Family History  Problem Relation Age of Onset  . Diabetes Maternal Grandfather     Social History Social History   Tobacco Use  . Smoking status: Never Smoker  . Smokeless tobacco: Never Used  Vaping Use  . Vaping Use: Never used  Substance Use Topics  . Alcohol use: No  . Drug use: No     Allergies   Patient has no known allergies.   Review of Systems Review of Systems  Constitutional: Positive for chills, fatigue and fever.  HENT: Negative for ear pain and sore throat.   Eyes: Negative for pain and visual disturbance.  Respiratory: Positive for cough. Negative for shortness of breath.   Cardiovascular: Negative for chest pain and palpitations.  Gastrointestinal: Negative for abdominal  pain, diarrhea, nausea and vomiting.  Genitourinary: Negative for dysuria and hematuria.  Musculoskeletal: Negative for arthralgias and back pain.  Skin: Negative for color change and rash.  Neurological: Negative for seizures and syncope.  All other systems reviewed and are negative.    Physical Exam Triage Vital Signs ED Triage Vitals  Enc Vitals Group     BP      Pulse      Resp      Temp      Temp src      SpO2      Weight      Height      Head Circumference      Peak Flow      Pain Score      Pain Loc      Pain Edu?      Excl. in GC?    No data found.  Updated Vital Signs BP 112/76   Pulse 94   Temp 98.5 F (36.9 C)   Resp 18   SpO2 96%   Visual Acuity Right Eye Distance:   Left Eye Distance:    Bilateral Distance:    Right Eye Near:   Left Eye Near:    Bilateral Near:     Physical Exam Vitals and nursing note reviewed.  Constitutional:      General: She is not in acute distress.    Appearance: She is well-developed. She is ill-appearing.  HENT:     Head: Normocephalic and atraumatic.     Right Ear: Tympanic membrane normal.     Left Ear: Tympanic membrane normal.     Nose: Nose normal.     Mouth/Throat:     Mouth: Mucous membranes are moist.     Pharynx: Oropharynx is clear.  Eyes:     Conjunctiva/sclera: Conjunctivae normal.  Cardiovascular:     Rate and Rhythm: Normal rate and regular rhythm.     Heart sounds: No murmur heard.   Pulmonary:     Effort: Pulmonary effort is normal. No respiratory distress.     Breath sounds: Normal breath sounds.     Comments: Occasional nonproductive cough during exam.  Abdominal:     Palpations: Abdomen is soft.     Tenderness: There is no abdominal tenderness. There is no guarding or rebound.  Musculoskeletal:     Cervical back: Neck supple.  Skin:    General: Skin is warm and dry.     Findings: No rash.  Neurological:     General: No focal deficit present.     Mental Status: She is alert and oriented to person, place, and time.     Gait: Gait normal.  Psychiatric:        Mood and Affect: Mood normal.        Behavior: Behavior normal.      UC Treatments / Results  Labs (all labs ordered are listed, but only abnormal results are displayed) Labs Reviewed  NOVEL CORONAVIRUS, NAA    EKG   Radiology No results found.  Procedures Procedures (including critical care time)  Medications Ordered in UC Medications - No data to display  Initial Impression / Assessment and Plan / UC Course  I have reviewed the triage vital signs and the nursing notes.  Pertinent labs & imaging results that were available during my care of the patient were reviewed by me and considered in my medical decision making (see chart for  details).   Cough.  Exposure to COVID.  PCR COVID pending.  Instructed patient to self quarantine until the test result is back.  Discussed with patient that she can take Tylenol as needed for fever or discomfort.  Instructed patient to go to the emergency department if she develops high fever, shortness of breath, severe diarrhea, or other concerning symptoms.  Patient agrees with plan of care.    Final Clinical Impressions(s) / UC Diagnoses   Final diagnoses:  Exposure to COVID-19 virus  Cough     Discharge Instructions     Your COVID test is pending.  You should self quarantine until the test result is back.    Take Tylenol as needed for fever or discomfort.  Rest and keep yourself hydrated.    Go to the emergency department if you develop acute worsening symptoms.        ED Prescriptions    None     PDMP not reviewed this encounter.   Mickie Bail, NP 09/14/19 1223

## 2019-09-16 ENCOUNTER — Telehealth: Payer: Self-pay | Admitting: Emergency Medicine

## 2019-09-16 ENCOUNTER — Ambulatory Visit (HOSPITAL_COMMUNITY)
Admission: RE | Admit: 2019-09-16 | Discharge: 2019-09-16 | Disposition: A | Discharge status: Home / Self Care | Payer: Self-pay | Source: Ambulatory Visit | Attending: Pulmonary Disease | Admitting: Pulmonary Disease

## 2019-09-16 ENCOUNTER — Other Ambulatory Visit (HOSPITAL_COMMUNITY): Payer: Self-pay | Admitting: Nurse Practitioner

## 2019-09-16 ENCOUNTER — Telehealth (HOSPITAL_COMMUNITY): Payer: Self-pay | Admitting: Oncology

## 2019-09-16 DIAGNOSIS — U071 COVID-19: Secondary | ICD-10-CM

## 2019-09-16 LAB — NOVEL CORONAVIRUS, NAA: SARS-CoV-2, NAA: DETECTED — AB

## 2019-09-16 LAB — SARS-COV-2, NAA 2 DAY TAT

## 2019-09-16 NOTE — Progress Notes (Signed)
I connected by phone with Kari Frost on 09/16/2019 at 4:05 PM to discuss the potential use of an new treatment for mild to moderate COVID-19 viral infection in non-hospitalized patients.  This patient is a 34 y.o. female that meets the FDA criteria for Emergency Use Authorization of casirivimab\imdevimab.  Has a (+) direct SARS-CoV-2 viral test result  Has mild or moderate COVID-19   Is ? 32 years of age and weighs ? 40 kg  Is NOT hospitalized due to COVID-19  Is NOT requiring oxygen therapy or requiring an increase in baseline oxygen flow rate due to COVID-19  Is within 10 days of symptom onset  Has at least one of the high risk factor(s) for progression to severe COVID-19 and/or hospitalization as defined in EUA.  Specific high risk criteria : BMI > 25  Symptom onset 09/13/19. Symptoms include: myalgias, fever, cough.   I have spoken and communicated the following to the patient or parent/caregiver:  1. FDA has authorized the emergency use of casirivimab\imdevimab for the treatment of mild to moderate COVID-19 in adults and pediatric patients with positive results of direct SARS-CoV-2 viral testing who are 68 years of age and older weighing at least 40 kg, and who are at high risk for progressing to severe COVID-19 and/or hospitalization.  2. The significant known and potential risks and benefits of casirivimab\imdevimab, and the extent to which such potential risks and benefits are unknown.  3. Information on available alternative treatments and the risks and benefits of those alternatives, including clinical trials.  4. Patients treated with casirivimab\imdevimab should continue to self-isolate and use infection control measures (e.g., wear mask, isolate, social distance, avoid sharing personal items, clean and disinfect "high touch" surfaces, and frequent handwashing) according to CDC guidelines.   5. The patient or parent/caregiver has the option to accept or refuse  casirivimab\imdevimab .  After reviewing this information with the patient, The patient agreed to proceed with receiving casirivimab\imdevimab infusion and will be provided a copy of the Fact sheet prior to receiving the infusion.Consuello Masse, DNP, AGNP-C [phone] (Infusion Center Hotline)

## 2019-09-16 NOTE — Telephone Encounter (Signed)
Called to Discuss with patient about Covid symptoms and the use of regeneron, a monoclonal antibody infusion for those with mild to moderate Covid symptoms and at a high risk of hospitalization.     Pt is qualified for this infusion at the [facility] infusion center due to co-morbid conditions and/or a member of an at-risk group.     Unable to reach pt. Left message to return call  Jenny Mallissa Lorenzen, AGNP-C [phone] (Infusion Center Hotline)  

## 2019-09-16 NOTE — Telephone Encounter (Signed)
Discussed COVID positive result with patient.  Discussed that I have referred her to the COVID infusion center for consideration and that they will call her if she meets the qualifications.  Discussed ongoing symptomatic treatment and quarantine.  Patient agrees to plan of care.

## 2019-09-17 ENCOUNTER — Ambulatory Visit (HOSPITAL_COMMUNITY)
Admission: RE | Admit: 2019-09-17 | Discharge: 2019-09-17 | Disposition: A | Discharge status: Home / Self Care | Payer: HRSA Program | Source: Ambulatory Visit | Attending: Pulmonary Disease | Admitting: Pulmonary Disease

## 2019-09-17 DIAGNOSIS — U071 COVID-19: Secondary | ICD-10-CM | POA: Insufficient documentation

## 2019-09-17 MED ORDER — DIPHENHYDRAMINE HCL 50 MG/ML IJ SOLN: 50.0000 mg | Freq: Once | INTRAMUSCULAR | Status: DC | PRN

## 2019-09-17 MED ORDER — ALBUTEROL SULFATE HFA 108 (90 BASE) MCG/ACT IN AERS: 2.0000 | INHALATION_SPRAY | Freq: Once | RESPIRATORY_TRACT | Status: DC | PRN

## 2019-09-17 MED ORDER — SODIUM CHLORIDE 0.9 % IV SOLN
1200.0000 mg | Freq: Once | INTRAVENOUS | Status: AC
  Administered 2019-09-17: 1200 mg via INTRAVENOUS
  Filled 2019-09-17: qty 10

## 2019-09-17 MED ORDER — SODIUM CHLORIDE 0.9 % IV SOLN: INTRAVENOUS | Status: DC | PRN

## 2019-09-17 MED ORDER — METHYLPREDNISOLONE SODIUM SUCC 125 MG IJ SOLR: 125.0000 mg | Freq: Once | INTRAMUSCULAR | Status: DC | PRN

## 2019-09-17 MED ORDER — EPINEPHRINE 0.3 MG/0.3ML IJ SOAJ: 0.3000 mg | Freq: Once | INTRAMUSCULAR | Status: DC | PRN

## 2019-09-17 MED ORDER — FAMOTIDINE IN NACL 20-0.9 MG/50ML-% IV SOLN: 20.0000 mg | Freq: Once | INTRAVENOUS | Status: DC | PRN

## 2019-09-17 NOTE — Progress Notes (Signed)
  Diagnosis: COVID-19  Physician:Dr wright  Procedure: Covid Infusion Clinic Med: casirivimab\imdevimab infusion - Provided patient with casirivimab\imdevimab fact sheet for patients, parents and caregivers prior to infusion.  Complications: No immediate complications noted.  Discharge: Discharged home   Kari Frost 09/17/2019  

## 2019-09-17 NOTE — Discharge Instructions (Signed)

## 2019-12-11 ENCOUNTER — Encounter: Payer: Self-pay | Admitting: Obstetrics and Gynecology

## 2019-12-11 ENCOUNTER — Ambulatory Visit (INDEPENDENT_AMBULATORY_CARE_PROVIDER_SITE_OTHER): Payer: BLUE CROSS/BLUE SHIELD | Admitting: Obstetrics and Gynecology

## 2019-12-11 ENCOUNTER — Other Ambulatory Visit: Payer: Self-pay

## 2019-12-11 VITALS — BP 118/74 | Wt 202.0 lb

## 2019-12-11 DIAGNOSIS — Z3046 Encounter for surveillance of implantable subdermal contraceptive: Secondary | ICD-10-CM | POA: Diagnosis not present

## 2019-12-11 NOTE — Progress Notes (Signed)
  GYNECOLOGY PROCEDURE NOTE  Nexplanon removal discussed in detail.  Risks of infection, bleeding, nerve injury all reviewed.  Patient understands risks and desires to proceed.  Verbal consent obtained.  Patient is certain she wants the Nexplanon removed.  All questions answered.  Procedure: Patient placed in dorsal supine with left arm above head, elbow flexed at 90 degrees, arm resting on examination table.  Nexplanon identified without problems.  Betadine scrub x3.  1 ml of 1% lidocaine injected under Nexplanondevice without problems.  Sterile gloves applied.  Small 0.5cm incision made at distal tip of Nexplanon device with 11 blade scalpel.  Nexplanon brought to incision and grasped with a small kelly clamp.  Nexplanon removed intact without problems.  Pressure applied to incision.  Hemostasis obtained.  Steri-strips applied, followed by bandage and compression dressing.  Patient tolerated procedure well.  No complications.  Assessment: 34 y.o. year old female now s/p uncomplicated Nexplanon removal.  Plan: 1.  Patient given post procedure precautions and asked to call for fever, chills, redness or drainage from her incision, bleeding from incision.  She understands she will likely have a small bruise near site of removal and can remove bandage tomorrow and steri-strips in approximately 1 week.  2) Contraception: will use condoms for now. Her husband is not frequently home. Encouraged her to follow up soon, if she does not desire pregnancy and doesn't want to use condoms.   [name], MD, [facility], [facility] 12/11/2019 2:44 PM

## 2020-01-30 NOTE — L&D Delivery Note (Signed)
Delivery Note Called to the room as patient was feeling increased pelvic pressure. She was checked and found to hav e rim of cervix that cleared with one contraction. At 1051, a viable female was delivered vaginally over  first degree perineal laceration. The head presented ROA and restituted to ROT. The anterior shoulder delivered easily with gentle downward guidance. A compound left hand was noted on the posterior side, and the posterior shoulder delivered spontaneously).The infant was placed skin to skin on the maternal abdomen for drying and bonding.  APGAR: 9, 9; weight  pending Placenta status:  ,delivered spontaneously at 1057  .  Cord:   3 vessel with the following complications: none .    Anesthesia:  epidiural Episiotomy:  none Lacerations:  1st degree perineal Suture Repair: 3.0 vicryl rapide Est. Blood Loss (mL):  500 cc  Mom to postpartum.  Baby to Couplet care / Skin to Skin.  Mirna Mires 01/27/2021, 11:40 AM

## 2020-02-12 ENCOUNTER — Ambulatory Visit: Payer: BLUE CROSS/BLUE SHIELD | Admitting: Obstetrics and Gynecology

## 2020-02-26 ENCOUNTER — Emergency Department: Payer: Self-pay

## 2020-02-26 ENCOUNTER — Other Ambulatory Visit: Payer: Self-pay

## 2020-02-26 DIAGNOSIS — Z3A01 Less than 8 weeks gestation of pregnancy: Secondary | ICD-10-CM | POA: Insufficient documentation

## 2020-02-26 DIAGNOSIS — O200 Threatened abortion: Secondary | ICD-10-CM | POA: Insufficient documentation

## 2020-02-26 DIAGNOSIS — O209 Hemorrhage in early pregnancy, unspecified: Secondary | ICD-10-CM

## 2020-02-26 LAB — CBC WITH DIFFERENTIAL/PLATELET
Abs Immature Granulocytes: 0.02 10*3/uL (ref 0.00–0.07)
Basophils Absolute: 0.1 10*3/uL (ref 0.0–0.1)
Basophils Relative: 0 %
Eosinophils Absolute: 0.2 10*3/uL (ref 0.0–0.5)
Eosinophils Relative: 1 %
HCT: 38.8 % (ref 36.0–46.0)
Hemoglobin: 13.1 g/dL (ref 12.0–15.0)
Immature Granulocytes: 0 %
Lymphocytes Relative: 32 %
Lymphs Abs: 3.7 10*3/uL (ref 0.7–4.0)
MCH: 30.8 pg (ref 26.0–34.0)
MCHC: 33.8 g/dL (ref 30.0–36.0)
MCV: 91.3 fL (ref 80.0–100.0)
Monocytes Absolute: 0.6 10*3/uL (ref 0.1–1.0)
Monocytes Relative: 5 %
Neutro Abs: 7.1 10*3/uL (ref 1.7–7.7)
Neutrophils Relative %: 62 %
Platelets: 301 10*3/uL (ref 150–400)
RBC: 4.25 MIL/uL (ref 3.87–5.11)
RDW: 12.5 % (ref 11.5–15.5)
WBC: 11.6 10*3/uL — ABNORMAL HIGH (ref 4.0–10.5)
nRBC: 0 % (ref 0.0–0.2)

## 2020-02-26 LAB — HCG, QUANTITATIVE, PREGNANCY: hCG, Beta Chain, Quant, S: 19108 m[IU]/mL — ABNORMAL HIGH (ref <5)

## 2020-02-26 IMAGING — US US OB < 14 WEEKS - US OB TV
1 series · 13 of 28 positions shown · non-contrast
Comparison: X-ray abdomen [DATE], ultrasound pelvis [DATE]

CLINICAL DATA: Bleeding, quantitative beta HCG 19, 108. Unsure of
last menstrual.

EXAM:
OBSTETRIC <14 WK US AND TRANSVAGINAL OB US
TECHNIQUE: Both transabdominal and transvaginal ultrasound examinations were
performed for complete evaluation of the gestation as well as the
maternal uterus, adnexal regions, and pelvic cul-de-sac.
Transvaginal technique was performed to assess early pregnancy.

[Series 1: us ob transvaginal · 13 of 47 slices shown]
[im 2/47]
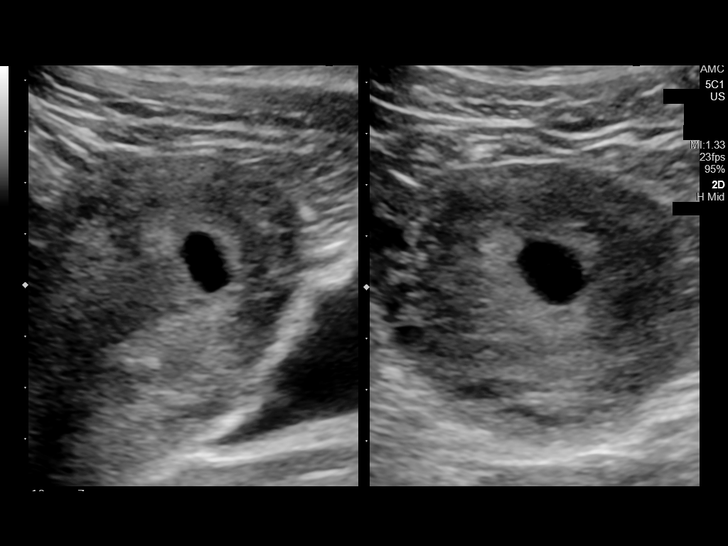
[im 6/47]
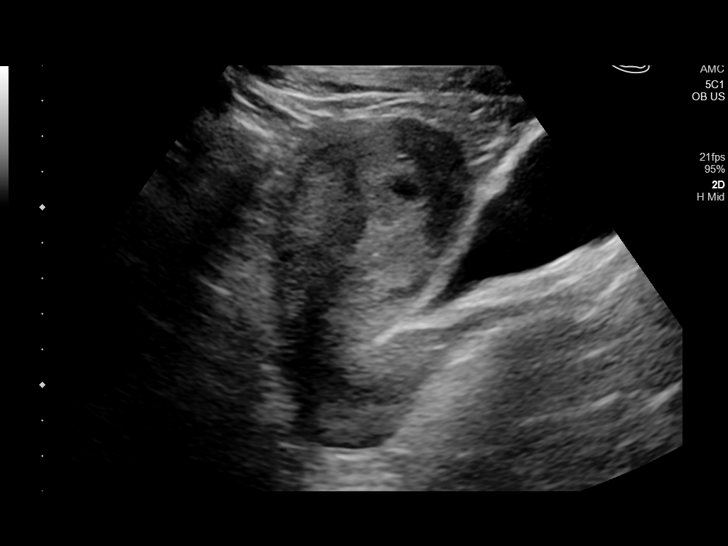
[im 9/47]
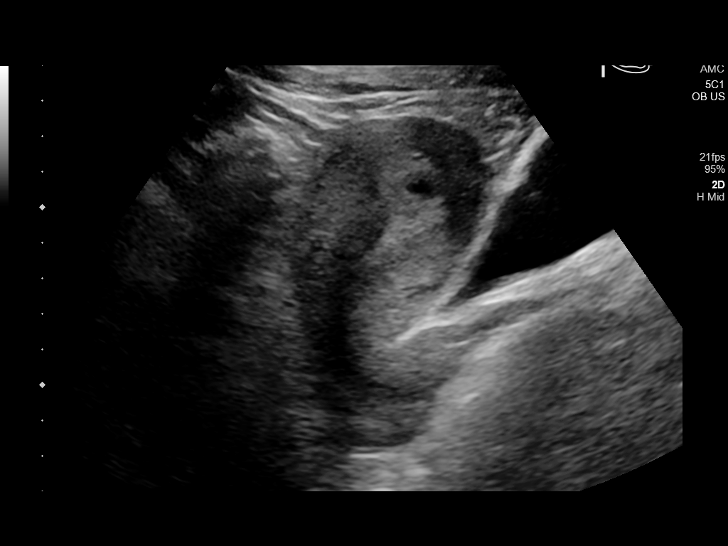
[im 12/47]
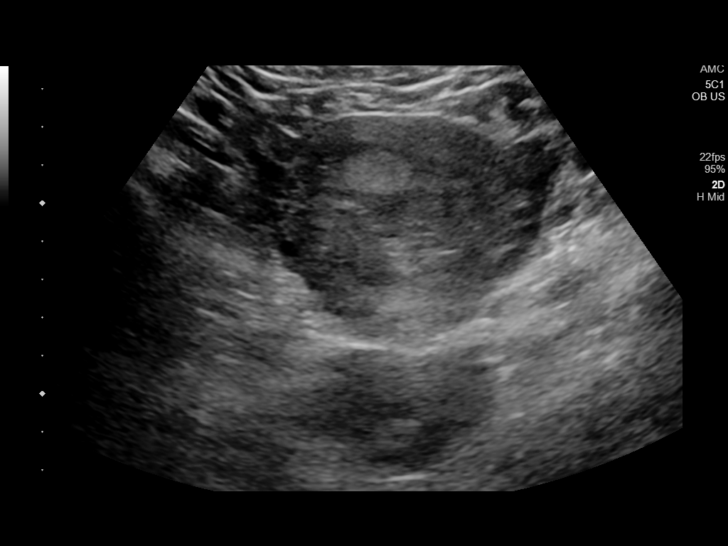
[im 16/47]
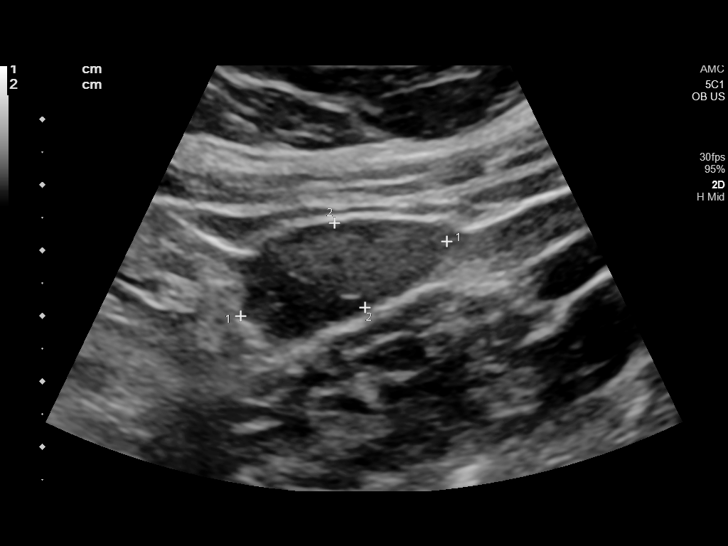
[im 19/47]
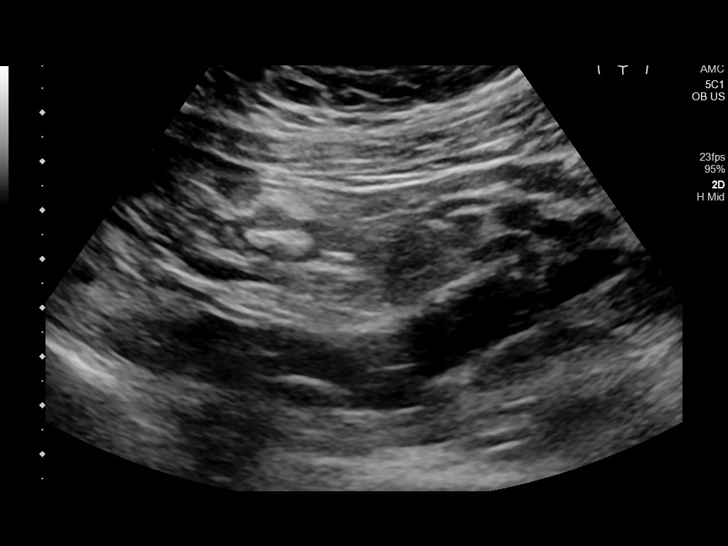
[im 24/47]
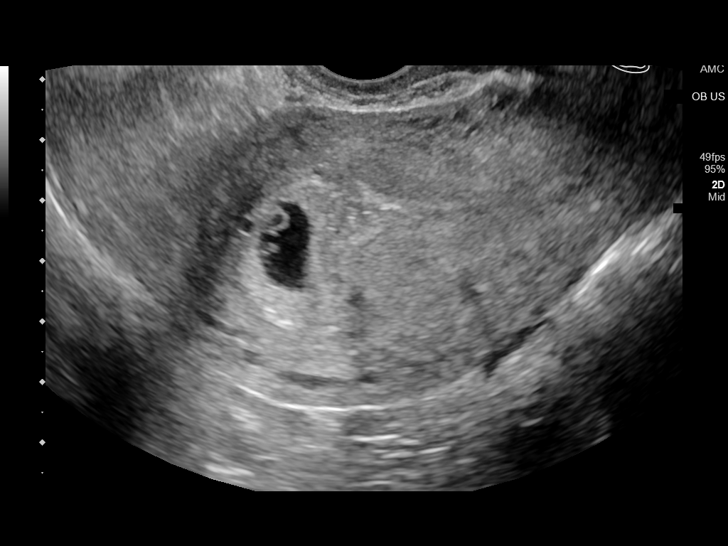
[im 28/47]
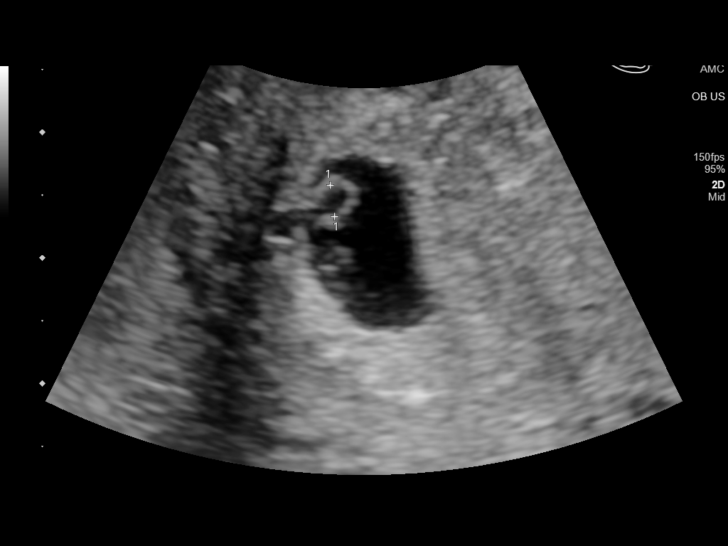
[im 31/47]
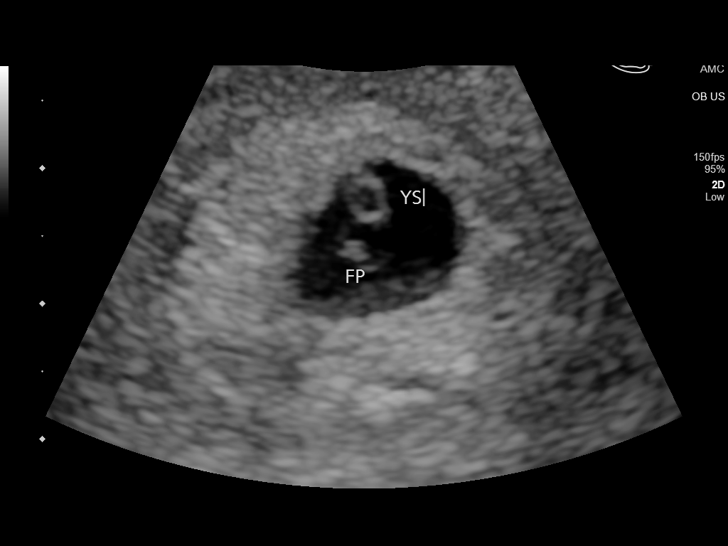
[im 35/47]
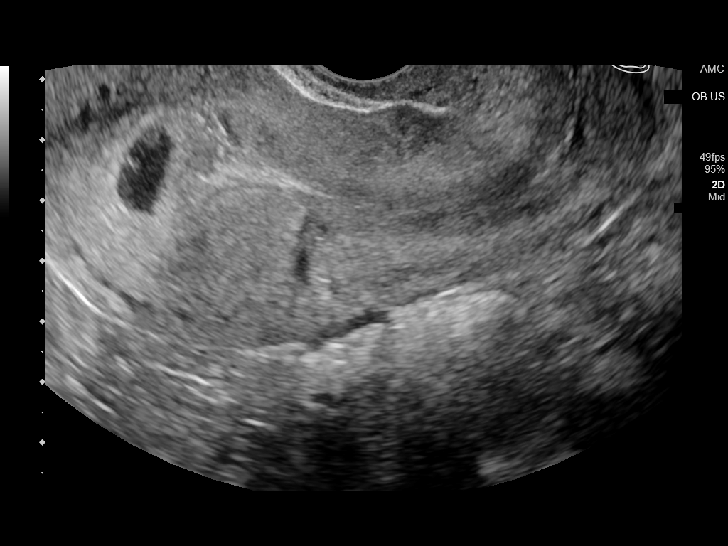
[im 38/47]
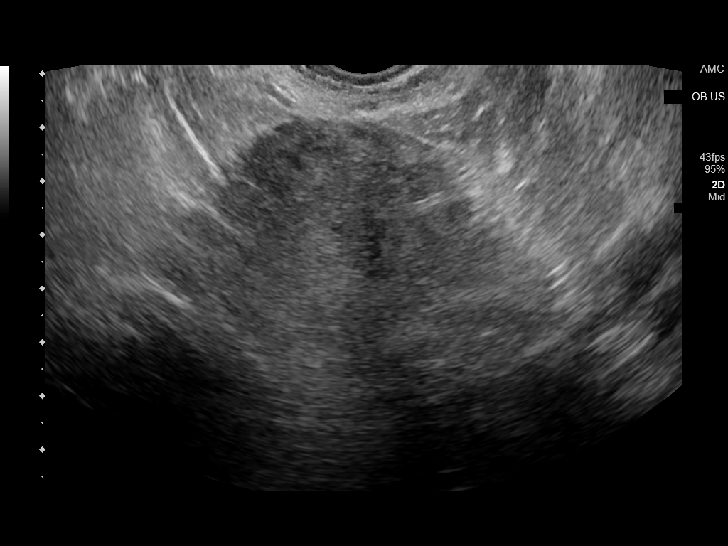
[im 41/47]
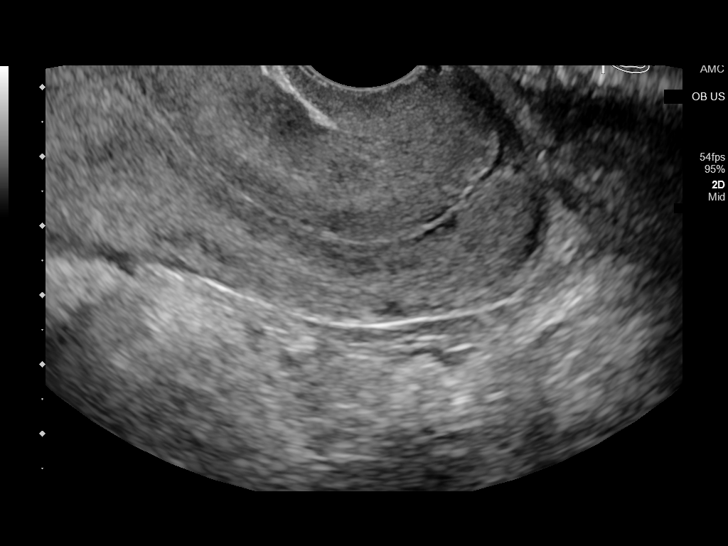
[im 45/47]
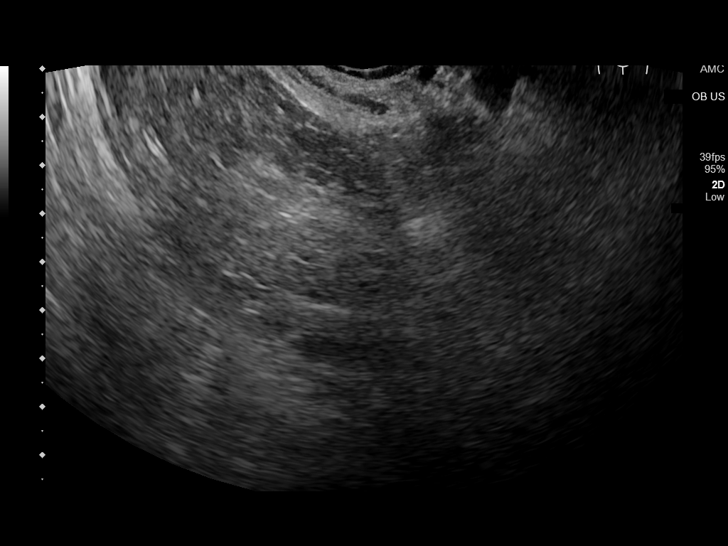

[13 of 28 positions shown; findings below may reference images not displayed]

FINDINGS: Intrauterine gestational sac: Single

Yolk sac:  Visualized.

Embryo:  Visualized.

Cardiac Activity: Not Visualized.

CRL:  2.1 mm   5 w   5 d                  US EDC: [DATE]

Subchorionic hemorrhage:  None visualized.

Maternal uterus/adnexae: The right ovary is unremarkable and
measures 3.4 x 1.4 by 2.7 cm. The left ovary is unremarkable and
measures 2.2 x 2 x 1.9 cm. The uterus is otherwise unremarkable.
Bilateral adnexa as are unremarkable.
IMPRESSION: Probable early intrauterine pregnancy with a gestational sac, yolk
sac, and fetal pole identified. No cardiac activity yet visualized.
Gestational age by ultrasound corresponds to 5 weeks and 5 days.
Recommend follow-up quantitative B-HCG levels and follow-up US in 14
days to assess viability. This recommendation follows SRU consensus
guidelines: Diagnostic Criteria for Nonviable Pregnancy Early in the
First Trimester. N Engl J Med [SE]; [DATE].

## 2020-02-26 NOTE — ED Triage Notes (Signed)
Pt states she believes she is experiencing a miscarriage. Pt states she took at home preganancy tests and they were positive and she is now having pelvic pain and vaginal bleeding. Pt states unknown last menstral period.

## 2020-02-27 ENCOUNTER — Emergency Department
Admission: EM | Admit: 2020-02-27 | Discharge: 2020-02-27 | Disposition: A | Discharge status: Home / Self Care | Payer: Self-pay | Attending: Emergency Medicine | Admitting: Emergency Medicine

## 2020-02-27 DIAGNOSIS — O200 Threatened abortion: Secondary | ICD-10-CM

## 2020-02-27 DIAGNOSIS — O209 Hemorrhage in early pregnancy, unspecified: Secondary | ICD-10-CM

## 2020-02-27 NOTE — Discharge Instructions (Addendum)
You have been seen in the Emergency Department (ED) for vaginal bleeding during pregnancy, which is called a threatened abortion.  Because you are very early in the pregnancy (about 5 weeks and 5 days according to the ultrasound) , we cannot tell you for sure that the baby is going to develop normally, if you are going to have a miscarriage, etc.  You need to follow-up with your OB/GYN as scheduled on Monday and they will likely get a repeat blood test called an hCG to see if the number is still going up.  They will also talk to more about the ultrasound, when to repeat the ultrasound, etc.  Please start taking prenatal vitamins (over the counter) if you are not already doing so.  As a result of your blood type, you did not receive an injection of medication called Rhogam - please let your OB/Gyn know.  Please follow up as recommended above.  If you develop any other symptoms that concern you (including, but not limited to, persistent vomiting, worsening bleeding, abdominal or pelvic pain, or fever greater than 101), please return immediately to the Emergency Department.

## 2020-02-27 NOTE — ED Provider Notes (Signed)
[facility] Emergency Department Provider Note  ____________________________________________   Event Date/Time   First MD Initiated Contact with Patient 02/27/20 0103     (approximate)  I have reviewed the triage vital signs and the nursing notes.   HISTORY  Chief Complaint Vaginal Bleeding    HPI Kari Frost is a 35 y.o. female with medical history as listed below who presents for evaluation of positive pregnancy test and vaginal bleeding with some lower pelvic cramping.  She is G3, P2 and had a positive pregnancy test a few weeks ago.  She had acute onset vaginal bleeding which she describes as initially severe but now is a lower amount.   She has intermittent lower abdominal cramping.  Some nausea, no vomiting.  She has never had a miscarriage or elective abortion.  No recent trauma.  Nothing particular makes the symptoms better or worse.  She already scheduled an OB/GYN appointment in 2 to 3 days with Dr. Jean Rosenthal at [facility].        Past Medical History:  Diagnosis Date  . Genital herpes   . Thyroid disease   . UTI (urinary tract infection)     Patient Active Problem List   Diagnosis Date Noted  . Mood disorder (HCC) 06/27/2018  . 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 Class 1 obesity due to excess calories without serious comorbidity with body mass index (BMI) of 30.0 to 30.9 in adult 10/14/2017  . Pure hypercholesterolemia 10/14/2017  . Displacement of intrauterine contraceptive device 04/10/2016  . Thyroid enlargement 11/30/2014  . Genital herpes simplex type 2 10/12/2014    Past Surgical History:  Procedure Laterality Date  . INTRAUTERINE DEVICE (IUD) INSERTION  2018  . INTRAUTERINE DEVICE (IUD) INSERTION N/A 04/26/2016   Procedure: INTRAUTERINE DEVICE (IUD) INSERTION;  Surgeon: Conard Novak, MD;  Location: [facility] ORS;  Service: Gynecology;   Laterality: N/A;  lot tu01sce exp 09/012020 mirena  . IUD REMOVAL N/A 04/26/2016   Procedure: INTRAUTERINE DEVICE (IUD) REMOVAL;  Surgeon: Conard Novak, MD;  Location: [facility] ORS;  Service: Gynecology;  Laterality: N/A;  . LAPAROSCOPY N/A 04/26/2016   Procedure: LAPAROSCOPY OPERATIVE;  Surgeon: Conard Novak, MD;  Location: [facility] ORS;  Service: Gynecology;  Laterality: N/A;  . nexplanon  2020    Prior to Admission medications   Not on File    Allergies Patient has no known allergies.  Family History  Problem Relation Age of Onset  . Diabetes Maternal Grandfather     Social History Social History   Tobacco Use  . Smoking status: Never Smoker  . Smokeless tobacco: Never Used  Vaping Use  . Vaping Use: Never used  Substance Use Topics  . Alcohol use: No  . Drug use: No    Review of Systems Constitutional: No fever/chills Eyes: No visual changes. ENT: No sore throat. Cardiovascular: Denies chest pain. Respiratory: Denies shortness of breath. Gastrointestinal: Some pelvic pain and nausea, no vomiting.  No diarrhea.  No constipation. Genitourinary: Positive pregnancy test and vaginal bleeding. Musculoskeletal: Negative for neck pain.  Negative for back pain. Integumentary: Negative for rash. Neurological: Negative for headaches, focal weakness or numbness.   ____________________________________________   PHYSICAL EXAM:  VITAL SIGNS: ED Triage Vitals  Enc Vitals Group     BP 02/26/20 2119 125/86     Pulse Rate 02/26/20 2119 92     Resp 02/26/20 2119 16     Temp 02/26/20  2119 98.8 F (37.1 C)     Temp Source 02/26/20 2119 Oral     SpO2 02/26/20 2119 100 %     Weight 02/26/20 2120 90.7 kg (200 lb)     Height 02/26/20 2120 1.651 m (5\' 5" )     Head Circumference --      Peak Flow --      Pain Score 02/26/20 2120 5     Pain Loc --      Pain Edu? --      Excl. in GC? --     Constitutional: Alert and oriented.  Eyes: Conjunctivae are normal.  Head:  Atraumatic. Nose: No congestion/rhinnorhea. Mouth/Throat: Patient is wearing a mask. Neck: No stridor.  No meningeal signs.   Cardiovascular: Normal rate, regular rhythm. Good peripheral circulation. Respiratory: Normal respiratory effort.  No retractions. Gastrointestinal: Soft and nontender. No distention.  Genitourinary: Deferred due to patient preference and joint decision making Musculoskeletal: No lower extremity tenderness nor edema. No gross deformities of extremities. Neurologic:  Normal speech and language. No gross focal neurologic deficits are appreciated.  Skin:  Skin is warm, dry and intact. Psychiatric: Mood and affect are normal. Speech and behavior are normal.  ____________________________________________   LABS (all labs ordered are listed, but only abnormal results are displayed)  Labs Reviewed  CBC WITH DIFFERENTIAL/PLATELET - Abnormal; Notable for the following components:      Result Value   WBC 11.6 (*)    All other components within normal limits  HCG, QUANTITATIVE, PREGNANCY - Abnormal; Notable for the following components:   hCG, Beta Chain, Quant, S 19,108 (*)    All other components within normal limits   ____________________________________________  EKG  No indication for emergent EKG ____________________________________________  RADIOLOGY I, 2121, personally viewed and evaluated these images (plain radiographs) as part of my medical decision making, as well as reviewing the written report by the radiologist.  ED MD interpretation: Probable intrauterine pregnancy, too early to make specific determinations.  Official radiology report(s): Loleta Rose OB LESS THAN 14 WEEKS WITH OB TRANSVAGINAL  Result Date: 02/26/2020 CLINICAL DATA:  Bleeding, quantitative beta HCG 19, 108. Unsure of last menstrual. EXAM: OBSTETRIC <14 WK 02/28/2020 AND TRANSVAGINAL OB US TECHNIQUE: Both transabdominal and transvaginal ultrasound examinations were performed for complete  evaluation of the gestation as well as the maternal uterus, adnexal regions, and pelvic cul-de-sac. Transvaginal technique was performed to assess early pregnancy. COMPARISON:  X-ray abdomen 04/11/2016, ultrasound pelvis 02/21/2018 FINDINGS: Intrauterine gestational sac: Single Yolk sac:  Visualized. Embryo:  Visualized. Cardiac Activity: Not Visualized. CRL:  2.1 mm   5 w   5 d                  02/23/2018 EDC: 10/23/2020 Subchorionic hemorrhage:  None visualized. Maternal uterus/adnexae: The right ovary is unremarkable and measures 3.4 x 1.4 by 2.7 cm. The left ovary is unremarkable and measures 2.2 x 2 x 1.9 cm. The uterus is otherwise unremarkable. Bilateral adnexa as are unremarkable. IMPRESSION: Probable early intrauterine pregnancy with a gestational sac, yolk sac, and fetal pole identified. No cardiac activity yet visualized. Gestational age by ultrasound corresponds to 5 weeks and 5 days. Recommend follow-up quantitative B-HCG levels and follow-up 10/25/2020 in 14 days to assess viability. This recommendation follows SRU consensus guidelines: Diagnostic Criteria for Nonviable Pregnancy Early in the First Trimester. [place] Med [id number]. Electronically Signed   By: ; [id number] M.D.   On: 02/26/2020 23:19    ____________________________________________  PROCEDURES   Procedure(s) performed (including Critical Care):  Procedures   ____________________________________________   INITIAL IMPRESSION / MDM / ASSESSMENT AND PLAN / ED COURSE  As part of my medical decision making, I reviewed the following data within the electronic MEDICAL RECORD NUMBER Nursing notes reviewed and incorporated, Labs reviewed , Old chart reviewed and Notes from prior ED visits   Differential diagnosis includes, but is not limited to, threatened abortion, ectopic pregnancy, subchorionic hemorrhage.  Patient has stable vitals and reassuring lab work.  hCG is appropriate for gestational age of about 5 weeks.  Fetal pole,  yolk sac, gestational sac all seen on ultrasound and the gestational age seems to be 5 weeks and 5 days.  I tried to explain to the patient that this is generally reassuring and is likely just too early to know for sure but I stressed the importance of following up as an outpatient on Monday as she has planned.  I explained about the concept of "threatened miscarriage" and that she needs to follow-up for repeat hCG as well as for an ultrasound probably in a few weeks.  She said that she understands.  I gave my usual and customary return precautions.  Of note, we discussed a pelvic exam but she would rather wait until she sees her OB/GYN on Monday and I think that is appropriate since it is unlikely to change to the plan or disposition tonight.  Patient has a blood type of A+ documented in her record.         ____________________________________________  FINAL CLINICAL IMPRESSION(S) / ED DIAGNOSES  Final diagnoses:  Threatened miscarriage     MEDICATIONS GIVEN DURING THIS VISIT:  Medications - No data to display   ED Discharge Orders    None      *Please note:  Tijana Walder was evaluated in Emergency Department on 02/27/2020 for the symptoms described in the history of present illness. She was evaluated in the context of the global COVID-19 pandemic, which necessitated consideration that the patient might be at risk for infection with the SARS-CoV-2 virus that causes COVID-19. Institutional protocols and algorithms that pertain to the evaluation of patients at risk for COVID-19 are in a state of rapid change based on information released by regulatory bodies including the CDC and federal and state organizations. These policies and algorithms were followed during the patient's care in the ED.  Some ED evaluations and interventions may be delayed as a result of limited staffing during and after the pandemic.*  Note:  This document was prepared using Dragon voice recognition software and  may include unintentional dictation errors.   Loleta Rose, MD 02/27/20 [phone]

## 2020-02-29 ENCOUNTER — Encounter: Payer: Self-pay | Admitting: Obstetrics and Gynecology

## 2020-02-29 ENCOUNTER — Other Ambulatory Visit (HOSPITAL_COMMUNITY)
Admission: RE | Admit: 2020-02-29 | Discharge: 2020-02-29 | Disposition: A | Discharge status: Home / Self Care | Payer: Medicaid Other | Source: Ambulatory Visit | Attending: Obstetrics and Gynecology | Admitting: Obstetrics and Gynecology

## 2020-02-29 ENCOUNTER — Other Ambulatory Visit: Payer: Self-pay

## 2020-02-29 ENCOUNTER — Ambulatory Visit (INDEPENDENT_AMBULATORY_CARE_PROVIDER_SITE_OTHER): Payer: BLUE CROSS/BLUE SHIELD | Admitting: Obstetrics and Gynecology

## 2020-02-29 VITALS — BP 118/74 | Wt 196.0 lb

## 2020-02-29 DIAGNOSIS — O0991 Supervision of high risk pregnancy, unspecified, first trimester: Secondary | ICD-10-CM | POA: Insufficient documentation

## 2020-02-29 DIAGNOSIS — Z113 Encounter for screening for infections with a predominantly sexual mode of transmission: Secondary | ICD-10-CM | POA: Insufficient documentation

## 2020-02-29 DIAGNOSIS — O99210 Obesity complicating pregnancy, unspecified trimester: Secondary | ICD-10-CM | POA: Insufficient documentation

## 2020-02-29 DIAGNOSIS — Z3A01 Less than 8 weeks gestation of pregnancy: Secondary | ICD-10-CM | POA: Insufficient documentation

## 2020-02-29 DIAGNOSIS — O209 Hemorrhage in early pregnancy, unspecified: Secondary | ICD-10-CM | POA: Insufficient documentation

## 2020-02-29 DIAGNOSIS — O0990 Supervision of high risk pregnancy, unspecified, unspecified trimester: Secondary | ICD-10-CM | POA: Insufficient documentation

## 2020-02-29 DIAGNOSIS — E049 Nontoxic goiter, unspecified: Secondary | ICD-10-CM | POA: Diagnosis not present

## 2020-02-29 DIAGNOSIS — O98319 Other infections with a predominantly sexual mode of transmission complicating pregnancy, unspecified trimester: Secondary | ICD-10-CM | POA: Insufficient documentation

## 2020-02-29 DIAGNOSIS — A6009 Herpesviral infection of other urogenital tract: Secondary | ICD-10-CM | POA: Insufficient documentation

## 2020-02-29 DIAGNOSIS — O99211 Obesity complicating pregnancy, first trimester: Secondary | ICD-10-CM

## 2020-02-29 DIAGNOSIS — Z131 Encounter for screening for diabetes mellitus: Secondary | ICD-10-CM

## 2020-02-29 DIAGNOSIS — Z369 Encounter for antenatal screening, unspecified: Secondary | ICD-10-CM

## 2020-02-29 DIAGNOSIS — O98311 Other infections with a predominantly sexual mode of transmission complicating pregnancy, first trimester: Secondary | ICD-10-CM

## 2020-02-29 NOTE — Progress Notes (Signed)
New Obstetric Patient H&P   Chief Complaint: "Desires prenatal care"   History of Present Illness: Patient is a 35 y.o. [id number] Not Hispanic or Latino female, LMP unsure presents with amenorrhea and positive home pregnancy test. Based on a [redacted]w[redacted]d u/s in the ER on 02/26/20, her EDD is Estimated Date of Delivery: 10/23/20 and her EGA is [redacted]w[redacted]d. Her last pap smear was 2 years ago and was no abnormalities.    She had a urine pregnancy test which was positive 3 or 4 week(s)  ago. Since her LMP she claims she has experienced vaginal bleeding that started 3 days ago and was initially heavy ("a huge gush").  She had a pelvic ultrasound that showed an intrauterine pregnancy, but was very early and did not show cardiac activity. Her past medical history is noncontributory. Her prior pregnancies are notable for no issues  Since her LMP, she admits to the use of tobacco products  no She claims she has gained zero pounds since the start of her pregnancy.  There are cats in the home in the home  no  She admits close contact with children on a regular basis  yes  She has had chicken pox in the past yes She has had Tuberculosis exposures, symptoms, or previously tested positive for TB   no Current or past history of domestic violence. no  Genetic Screening/Teratology Counseling: (Includes patient, baby's father, or anyone in either family with:)   1. Patient's age >/= 75 at [facility]  no 2. Thalassemia ([place], [place], Mediterranean, or Asian background): MCV<80  no 3. Neural tube defect (meningomyelocele, spina bifida, anencephaly)  no 4. Congenital heart defect  no  5. Down syndrome  no 6. Tay-Sachs (Jewish, [place]  no 7. Canavan's Disease  no 8. Sickle cell disease or trait (African)  no  9. Hemophilia or other blood disorders  no  10. Muscular dystrophy  no  11. Cystic fibrosis  no  12. Huntington's Chorea  no  13. Mental retardation/autism  no 14. Other inherited genetic or chromosomal disorder   no 15. Maternal metabolic disorder (DM, PKU, etc)  no 16. Patient or FOB with a child with a birth defect not listed above no  16a. Patient or FOB with a birth defect themselves no 17. Recurrent pregnancy loss, or stillbirth  no  18. Any medications since LMP other than prenatal vitamins (include vitamins, supplements, OTC meds, drugs, alcohol)  no 19. Any other genetic/environmental exposure to discuss  no  Infection History:   1. Lives with someone with TB or TB exposed  no  2. Patient or partner has history of genital herpes  yes 3. Rash or viral illness since LMP  no 4. History of STI (GC, CT, HPV, syphilis, HIV)  no 5. History of recent travel :  no  Other pertinent information:  no   Review of Systems:10 point review of systems negative unless otherwise noted in HPI  Past Medical History:  Diagnosis Date  . Genital herpes   . Thyroid disease   . UTI (urinary tract infection)     Past Surgical History:  Procedure Laterality Date  . INTRAUTERINE DEVICE (IUD) INSERTION  2018  . INTRAUTERINE DEVICE (IUD) INSERTION N/A 04/26/2016   Procedure: INTRAUTERINE DEVICE (IUD) INSERTION;  Surgeon: Conard Novak, MD;  Location: [facility] ORS;  Service: Gynecology;  Laterality: N/A;  lot tu01sce exp 09/012020 mirena  . IUD REMOVAL N/A 04/26/2016   Procedure: INTRAUTERINE DEVICE (IUD) REMOVAL;  Surgeon: Conard Novak,  MD;  Location: [facility] ORS;  Service: Gynecology;  Laterality: N/A;  . LAPAROSCOPY N/A 04/26/2016   Procedure: LAPAROSCOPY OPERATIVE;  Surgeon: Conard Novak, MD;  Location: [facility] ORS;  Service: Gynecology;  Laterality: N/A;  . nexplanon  2020    Gynecologic History: No LMP recorded (lmp unknown). Patient is pregnant.  Obstetric History: [id number]  Family History  Problem Relation Age of Onset  . Diabetes Maternal Grandfather     Social History   Socioeconomic History  . Marital status: Married    Spouse name: Chantel Teti  . Number of children: 2  . Years  of education: Not on file  . Highest education level: Not on file  Occupational History  . 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 Smoking status: Never Smoker  . Smokeless tobacco: Never Used  Vaping Use  . Vaping Use: Never used  Substance and Sexual Activity  . Alcohol use: No  . Drug use: No  . Sexual activity: Yes    Birth control/protection: None  Other Topics Concern  . Not on file  Social History Narrative   Out of work due to [employer]   Social Determinants of Health   Financial Resource Strain: Not on file  Food Insecurity: Not on file  Transportation Needs: Not on file  Physical Activity: Not on file  Stress: Not on file  Social Connections: Not on file  Intimate Partner Violence: Not on file   Allergies: No Known Allergies  Prior to Admission medications   Denies    Physical Exam BP 118/74   Wt 196 lb (88.9 kg)   LMP  (LMP Unknown)   BMI 32.62 kg/m   Physical Exam Constitutional:      General: She is not in acute distress.    Appearance: Normal appearance. She is well-developed.  Genitourinary:     Vulva, bladder and urethral meatus normal.     Right Labia: No rash, tenderness, lesions, skin changes or Bartholin's cyst.    Left Labia: No tenderness, skin changes, Bartholin's cyst or rash.    No inguinal adenopathy present in the right or left side.    Pelvic Tanner Score: 5/5.     Right Adnexa: not tender, not full and no mass present.    Left Adnexa: not tender, not full and no mass present.    No cervical motion tenderness, friability, lesion or polyp.     Uterus is not enlarged, fixed or tender.     Uterus is anteverted.     No urethral tenderness or mass present.     Pelvic exam was performed with patient in the lithotomy position.  HENT:     Head: Normocephalic and atraumatic.  Eyes:     General: No scleral icterus.    Conjunctiva/sclera: Conjunctivae normal.  Cardiovascular:     Rate and Rhythm:  Normal rate and regular rhythm.     Heart sounds: No murmur heard. No friction rub. No gallop.   Pulmonary:     Effort: Pulmonary effort is normal. No respiratory distress.     Breath sounds: Normal breath sounds. No wheezing or rales.  Abdominal:     General: Bowel sounds are normal. There is no distension.     Palpations: Abdomen is soft. There is no mass.     Tenderness: There is no abdominal tenderness. There is no guarding or rebound.     Hernia: There is no hernia in the left inguinal area  or right inguinal area.  Musculoskeletal:        General: Normal range of motion.     Cervical back: Normal range of motion and neck supple.  Lymphadenopathy:     Lower Body: No right inguinal adenopathy. No left inguinal adenopathy.  Neurological:     General: No focal deficit present.     Mental Status: She is alert and oriented to person, place, and time.     Cranial Nerves: No cranial nerve deficit.  Skin:    General: Skin is warm and dry.     Findings: No erythema.  Psychiatric:        Mood and Affect: Mood normal.        Behavior: Behavior normal.        Judgment: Judgment normal.       Female Chaperone present during breast and/or pelvic exam.   Assessment: 35 y.o. G3P2002 at [redacted]w[redacted]d presenting to initiate prenatal care  Plan: 1) Avoid alcoholic beverages. 2) Patient encouraged not to smoke.  3) Discontinue the use of all non-medicinal drugs and chemicals.  4) Take prenatal vitamins daily.  5) Nutrition, food safety (fish, cheese advisories, and high nitrite foods) and exercise discussed. 6) Hospital and practice style discussed with cross coverage system.  7) Genetic Screening, such as with 1st Trimester Screening, cell free fetal DNA, AFP testing, and Ultrasound, as well as with amniocentesis and CVS as appropriate, is discussed with patient. At the conclusion of today's visit patient undecided genetic testing 8) Patient is asked about travel to areas at risk for the [place]  virus, and counseled to avoid travel and exposure to mosquitoes or sexual partners who may have themselves been exposed to the virus. Testing is discussed, and will be ordered as appropriate.  9) will check hCG today and TSH.  Plan for f/u uls in about 8 days per ACOG PB 200.   [name], MD 02/29/2020 8:23 AM

## 2020-03-01 ENCOUNTER — Other Ambulatory Visit: Payer: Self-pay | Admitting: Obstetrics and Gynecology

## 2020-03-01 DIAGNOSIS — O209 Hemorrhage in early pregnancy, unspecified: Secondary | ICD-10-CM

## 2020-03-01 LAB — CERVICOVAGINAL ANCILLARY ONLY
Chlamydia: NEGATIVE
Comment: NEGATIVE
Comment: NORMAL
Neisseria Gonorrhea: NEGATIVE

## 2020-03-01 LAB — BETA HCG QUANT (REF LAB): hCG Quant: 13023 m[IU]/mL

## 2020-03-01 LAB — TSH: TSH: 1.43 u[IU]/mL (ref 0.450–4.500)

## 2020-03-02 ENCOUNTER — Other Ambulatory Visit: Payer: BLUE CROSS/BLUE SHIELD

## 2020-03-02 ENCOUNTER — Other Ambulatory Visit: Payer: Self-pay

## 2020-03-02 DIAGNOSIS — O209 Hemorrhage in early pregnancy, unspecified: Secondary | ICD-10-CM

## 2020-03-03 LAB — BETA HCG QUANT (REF LAB): hCG Quant: 8788 m[IU]/mL

## 2020-03-07 ENCOUNTER — Other Ambulatory Visit: Payer: Self-pay | Admitting: Obstetrics and Gynecology

## 2020-03-07 ENCOUNTER — Telehealth: Payer: Self-pay | Admitting: Obstetrics and Gynecology

## 2020-03-07 ENCOUNTER — Ambulatory Visit (INDEPENDENT_AMBULATORY_CARE_PROVIDER_SITE_OTHER): Payer: BLUE CROSS/BLUE SHIELD

## 2020-03-07 ENCOUNTER — Other Ambulatory Visit: Payer: Self-pay

## 2020-03-07 ENCOUNTER — Ambulatory Visit: Payer: BLUE CROSS/BLUE SHIELD

## 2020-03-07 ENCOUNTER — Ambulatory Visit: Payer: BLUE CROSS/BLUE SHIELD | Admitting: Obstetrics and Gynecology

## 2020-03-07 DIAGNOSIS — O209 Hemorrhage in early pregnancy, unspecified: Secondary | ICD-10-CM | POA: Diagnosis not present

## 2020-03-07 DIAGNOSIS — O0991 Supervision of high risk pregnancy, unspecified, first trimester: Secondary | ICD-10-CM

## 2020-03-07 NOTE — Telephone Encounter (Signed)
She had light bleeding two days ago and yesterday she was passing big clots throughout the day. She now is having bleeding like a period and some bad cramps.   Discussed u/s findings and lab findings.  She is having a miscarriage given there is no longer an obvious IUP where there was one before.   Precautions for heavy bleeding given (soaking 2 or more pads per hour for 2 hours).  She will check a home urine pregnancy test and if it is still positive in 2-3 weeks, she will let me know and we'll check another serum hCG.  All questions answered.

## 2020-03-08 ENCOUNTER — Telehealth: Payer: Self-pay | Admitting: Obstetrics and Gynecology

## 2020-03-08 NOTE — Telephone Encounter (Signed)
Patient spoke with Dr Jean Rosenthal and was told to call office to get a work note for her to be out today and tomorrow. Patient had miscarriage and would like few days to recover.   CB# [phone]

## 2020-03-08 NOTE — Telephone Encounter (Signed)
Pt aware letter done. Will email to pt

## 2020-03-21 ENCOUNTER — Ambulatory Visit: Payer: BLUE CROSS/BLUE SHIELD | Admitting: Obstetrics and Gynecology

## 2020-06-07 ENCOUNTER — Telehealth: Payer: Self-pay

## 2020-06-07 NOTE — Telephone Encounter (Signed)
Pt calling; has appt Mon; is unable to eat; anything to help?  [phone]  Adv Emetrol is can keep it down; sips of ginger ale and sprite which is what pt is doing; Vitamin B6, Unisom, ginger drops, nausea suckers, wrist bands. Pt aware to be seen if she doesn't keep fluids down for 24 hrs.

## 2020-06-13 ENCOUNTER — Other Ambulatory Visit: Payer: Self-pay

## 2020-06-13 ENCOUNTER — Encounter: Payer: Self-pay | Admitting: Obstetrics and Gynecology

## 2020-06-13 ENCOUNTER — Ambulatory Visit (INDEPENDENT_AMBULATORY_CARE_PROVIDER_SITE_OTHER): Payer: Self-pay | Admitting: Obstetrics and Gynecology

## 2020-06-13 ENCOUNTER — Other Ambulatory Visit (HOSPITAL_COMMUNITY)
Admission: RE | Admit: 2020-06-13 | Discharge: 2020-06-13 | Disposition: A | Discharge status: Home / Self Care | Payer: BC Managed Care – PPO | Source: Ambulatory Visit | Attending: Obstetrics and Gynecology | Admitting: Obstetrics and Gynecology

## 2020-06-13 VITALS — BP 120/74 | Wt 185.0 lb

## 2020-06-13 DIAGNOSIS — Z3A00 Weeks of gestation of pregnancy not specified: Secondary | ICD-10-CM | POA: Insufficient documentation

## 2020-06-13 DIAGNOSIS — O0991 Supervision of high risk pregnancy, unspecified, first trimester: Secondary | ICD-10-CM

## 2020-06-13 DIAGNOSIS — O99210 Obesity complicating pregnancy, unspecified trimester: Secondary | ICD-10-CM | POA: Insufficient documentation

## 2020-06-13 DIAGNOSIS — O99211 Obesity complicating pregnancy, first trimester: Secondary | ICD-10-CM

## 2020-06-13 DIAGNOSIS — Z113 Encounter for screening for infections with a predominantly sexual mode of transmission: Secondary | ICD-10-CM | POA: Diagnosis present

## 2020-06-13 DIAGNOSIS — O98511 Other viral diseases complicating pregnancy, first trimester: Secondary | ICD-10-CM

## 2020-06-13 DIAGNOSIS — O0990 Supervision of high risk pregnancy, unspecified, unspecified trimester: Secondary | ICD-10-CM | POA: Insufficient documentation

## 2020-06-13 DIAGNOSIS — Z3A01 Less than 8 weeks gestation of pregnancy: Secondary | ICD-10-CM

## 2020-06-13 DIAGNOSIS — B009 Herpesviral infection, unspecified: Secondary | ICD-10-CM | POA: Insufficient documentation

## 2020-06-13 NOTE — Progress Notes (Signed)
New Obstetric Patient H&P   Chief Complaint: "Desires prenatal care"   History of Present Illness: Patient is a 35 y.o. [id number] Not Hispanic or Latino female, LMP unknown presents with amenorrhea and positive home pregnancy test. Based on her  LMP, her EDD is Estimated Date of Delivery: unknown. and her EGA is Unknown. Her last pap smear was 2 years ago and was no abnormalities. She had a miscarriage in January and maybe had one episode in February.     She had a urine pregnancy test which was positive 2 week(s)  ago. Since her LMP she claims she has experienced nausea. She denies vaginal bleeding. Her past medical history is noncontributory. Her prior pregnancies are notable for no issues  Since her LMP, she admits to the use of tobacco products  no She claims she has lost 10 pounds since the start of her pregnancy.  There are cats in the home in the home  no  She admits close contact with children on a regular basis  yes  She has had chicken pox in the past yes She has had Tuberculosis exposures, symptoms, or previously tested positive for TB   no Current or past history of domestic violence. no  Genetic Screening/Teratology Counseling: (Includes patient, baby's father, or anyone in either family with:)   1. Patient's age >/= 2 at [facility]  no 2. Thalassemia ([place], [place], Mediterranean, or Asian background): MCV<80  no 3. Neural tube defect (meningomyelocele, spina bifida, anencephaly)  no 4. Congenital heart defect  no  5. Down syndrome  no 6. Tay-Sachs (Jewish, [place]  no 7. Canavan's Disease  no 8. Sickle cell disease or trait (African)  no  9. Hemophilia or other blood disorders  no  10. Muscular dystrophy  no  11. Cystic fibrosis  no  12. Huntington's Chorea  no  13. Mental retardation/autism  no 14. Other inherited genetic or chromosomal disorder  no 15. Maternal metabolic disorder (DM, PKU, etc)  no 16. Patient or FOB with a child with a birth defect not listed  above no  16a. Patient or FOB with a birth defect themselves no 17. Recurrent pregnancy loss, or stillbirth  no  18. Any medications since LMP other than prenatal vitamins (include vitamins, supplements, OTC meds, drugs, alcohol)  no 19. Any other genetic/environmental exposure to discuss  no  Infection History:   1. Lives with someone with TB or TB exposed  no  2. Patient or partner has history of genital herpes  yes 3. Rash or viral illness since LMP  no 4. History of STI (GC, CT, HPV, syphilis, HIV)  no 5. History of recent travel :  no  Other pertinent information:  no     Review of Systems:10 point review of systems negative unless otherwise noted in HPI  Past Medical History:  Diagnosis Date  . Genital herpes   . Thyroid disease   . UTI (urinary tract infection)     Past Surgical History:  Procedure Laterality Date  . INTRAUTERINE DEVICE (IUD) INSERTION  2018  . INTRAUTERINE DEVICE (IUD) INSERTION N/A 04/26/2016   Procedure: INTRAUTERINE DEVICE (IUD) INSERTION;  Surgeon: Conard Novak, MD;  Location: [facility] ORS;  Service: Gynecology;  Laterality: N/A;  lot tu01sce exp 09/012020 mirena  . IUD REMOVAL N/A 04/26/2016   Procedure: INTRAUTERINE DEVICE (IUD) REMOVAL;  Surgeon: Conard Novak, MD;  Location: [facility] ORS;  Service: Gynecology;  Laterality: N/A;  . LAPAROSCOPY N/A 04/26/2016   Procedure: LAPAROSCOPY OPERATIVE;  Surgeon: Conard Novak, MD;  Location: [facility] OR[facility];  Service: Gynecology;  Laterality: N/A;  . nexplanon  2020    Gynecologic History: No LMP recorded (lmp unknown). Patient is pregnant.  Obstetric History: [id number]  Family History  Problem Relation Age of Onset  . Diabetes Maternal Grandfather     Social History   Socioeconomic History  . Marital status: Married    Spouse name: Yvonnie Schinke  . Number of children: 2  . Years of education: Not on file  . Highest education level: Not on file  Occupational History  . Occupation: Public librarian    Comment: Unemployed since [employer]vid 19  Tobacco Use  . Smoking status: Never Smoker  . Smokeless tobacco: Never Used  Vaping Use  . Vaping Use: Never used  Substance and Sexual Activity  . Alcohol use: No  . Drug use: No  . Sexual activity: Yes    Birth control/protection: None  Other Topics Concern  . Not on file  Social History Narrative   Out of work due to [employer]   Social Determinants of Health   Financial Resource Strain: Not on file  Food Insecurity: Not on file  Transportation Needs: Not on file  Physical Activity: Not on file  Stress: Not on file  Social Connections: Not on file  Intimate Partner Violence: Not on file    No Known Allergies  Prior to Admission medications   PNVs    Physical Exam BP 120/74   Wt 185 lb (83.9 kg)   LMP  (LMP Unknown)   Breastfeeding Unknown   BMI 30.79 kg/m   Physical Exam Constitutional:      General: She is not in acute distress.    Appearance: Normal appearance. She is well-developed.  Genitourinary:     Vulva, bladder and urethral meatus normal.     Right Labia: No rash, tenderness, lesions, skin changes or Bartholin's cyst.    Left Labia: No tenderness, skin changes, Bartholin's cyst or rash.    No inguinal adenopathy present in the right or left side.    Pelvic Tanner Score: 5/5.     Right Adnexa: not tender, not full and no mass present.    Left Adnexa: not tender, not full and no mass present.    No cervical motion tenderness, friability, lesion or polyp.     Uterus is not enlarged, fixed or tender.     Uterus is anteverted.     No urethral tenderness or mass present.     Pelvic exam was performed with patient in the lithotomy position.  HENT:     Head: Normocephalic and atraumatic.  Eyes:     General: No scleral icterus.    Conjunctiva/sclera: Conjunctivae normal.  Cardiovascular:     Rate and Rhythm: Normal rate and regular rhythm.     Heart sounds: No murmur heard. No friction rub. No gallop.    Pulmonary:     Effort: Pulmonary effort is normal. No respiratory distress.     Breath sounds: Normal breath sounds. No wheezing or rales.  Abdominal:     General: Bowel sounds are normal. There is no distension.     Palpations: Abdomen is soft. There is no mass.     Tenderness: There is no abdominal tenderness. There is no guarding or rebound.     Hernia: There is no hernia in the left inguinal area or right inguinal area.  Musculoskeletal:        General: Normal range of motion.  Cervical back: Normal range of motion and neck supple.  Lymphadenopathy:     Lower Body: No right inguinal adenopathy. No left inguinal adenopathy.  Neurological:     General: No focal deficit present.     Mental Status: She is alert and oriented to person, place, and time.     Cranial Nerves: No cranial nerve deficit.  Skin:    General: Skin is warm and dry.     Findings: No erythema.  Psychiatric:        Mood and Affect: Mood normal.        Behavior: Behavior normal.        Judgment: Judgment normal.    Female Chaperone present during breast and/or pelvic exam.   Assessment: 35 y.o. [id number] at Unknown gestational age presenting to initiate prenatal care  Plan: 1) Avoid alcoholic beverages. 2) Patient encouraged not to smoke.  3) Discontinue the use of all non-medicinal drugs and chemicals.  4) Take prenatal vitamins daily.  5) Nutrition, food safety (fish, cheese advisories, and high nitrite foods) and exercise discussed. 6) Hospital and practice style discussed with cross coverage system.  7) Genetic Screening, such as with 1st Trimester Screening, cell free fetal DNA, AFP testing, and Ultrasound, as well as with amniocentesis and CVS as appropriate, is discussed with patient. At the conclusion of today's visit patient undecided genetic testing 8) Patient is asked about travel to areas at risk for the [place] virus, and counseled to avoid travel and exposure to mosquitoes or sexual partners who  may have themselves been exposed to the virus. Testing is discussed, and will be ordered as appropriate. 9) will get pelvic u/s for dating and then get NOB labs. She is up to date on her pap smears  10) She has a history of an enlarged thyroid, but has never had to take medication for this. Her TSH values have been normal.   [name], MD 06/13/2020 3:51 PM

## 2020-06-13 NOTE — Patient Instructions (Signed)
For nausea (these may be purchased over-the-counter): -Vitamin B6 (pyridoxine):  25 mg three times each day (may buy 100 mg tablet and take twice per day or try to cut into 4 equal pieces and take 1 piece three times each day).  - doxylamine (found in [employer] and other sleep agents that can be bought in the store): take 25 - 50 mg at bedtime.  May take up to 25 mg three time each day.  However, keep in mind that this might make you sleepy.  

## 2020-06-15 ENCOUNTER — Ambulatory Visit (INDEPENDENT_AMBULATORY_CARE_PROVIDER_SITE_OTHER): Payer: BC Managed Care – PPO | Admitting: Obstetrics and Gynecology

## 2020-06-15 ENCOUNTER — Encounter: Payer: Self-pay | Admitting: Obstetrics and Gynecology

## 2020-06-15 ENCOUNTER — Other Ambulatory Visit: Payer: Self-pay

## 2020-06-15 ENCOUNTER — Telehealth: Payer: Self-pay

## 2020-06-15 VITALS — BP 126/75 | Wt 186.0 lb

## 2020-06-15 DIAGNOSIS — O210 Mild hyperemesis gravidarum: Secondary | ICD-10-CM

## 2020-06-15 DIAGNOSIS — O0990 Supervision of high risk pregnancy, unspecified, unspecified trimester: Secondary | ICD-10-CM

## 2020-06-15 DIAGNOSIS — Z3A09 9 weeks gestation of pregnancy: Secondary | ICD-10-CM

## 2020-06-15 DIAGNOSIS — O219 Vomiting of pregnancy, unspecified: Secondary | ICD-10-CM | POA: Insufficient documentation

## 2020-06-15 LAB — CERVICOVAGINAL ANCILLARY ONLY
Chlamydia: NEGATIVE
Comment: NEGATIVE
Comment: NORMAL
Neisseria Gonorrhea: NEGATIVE

## 2020-06-15 MED ORDER — METOCLOPRAMIDE HCL 10 MG PO TABS: 10.0000 mg | ORAL_TABLET | Freq: Three times a day (TID) | ORAL | 4 refills | Status: DC | PRN

## 2020-06-15 MED ORDER — DOXYLAMINE-PYRIDOXINE 10-10 MG PO TBEC: DELAYED_RELEASE_TABLET | ORAL | 5 refills | Status: DC

## 2020-06-15 NOTE — Patient Instructions (Addendum)
Take Prilosac (or generic) once per day (best in the morning first thing) Vitamin B6 25 mg three times per day (or an entire 100 mg tablet broken up into 3 doses over the whole day). Doxylamine (found in [employer]is[employer]): may take 25-50 mg up to 4 times per day, but you may prefer to take just at bedtime.  I'll add Reglan. Take 10 mg every 8 hours whether you need it or not.   I'm going to try to prescribe Diclegis. If you can get this, you should stop taking B6 and doxylamine  Goals: -stay hydrated with water and gatorade (mixed with some water might be better).

## 2020-06-15 NOTE — Telephone Encounter (Signed)
Pt calling; doesn't know how far along she is; for the last 2wks she has been nauseated; tries to eat but is unable to; feels very weak; lightheaded; have been vomiting today.  [phone]  Adv pt to be seen.  Tx'd to [facility] for scheduling.

## 2020-06-15 NOTE — Progress Notes (Signed)
Obstetrics & Gynecology Office Visit   Chief Complaint:  Chief Complaint  Patient presents with  . Nausea    Has not ate in 2 weeks  . Dizziness    History of Present Illness: 35 y.o. female at an unknown gestational age who presents with nausea and emesis. The emesis just started this morning. Last night she felt dizzy and felt weak.  She is able to keep down sips of Ginger Ale.  She tried a little apple sauce that did stay down.  She has taken Zofran that she got from a friend at work. This did not help.   She is not bleeding or having other problems.     Past Medical History:  Diagnosis Date  . Genital herpes   . Thyroid disease   . UTI (urinary tract infection)     Past Surgical History:  Procedure Laterality Date  . INTRAUTERINE DEVICE (IUD) INSERTION  2018  . INTRAUTERINE DEVICE (IUD) INSERTION N/A 04/26/2016   Procedure: INTRAUTERINE DEVICE (IUD) INSERTION;  Surgeon: Conard Novak, MD;  Location: [facility] OR[facility];  Service: Gynecology;  Laterality: N/A;  lot tu01sce exp 09/012020 mirena  . IUD REMOVAL N/A 04/26/2016   Procedure: INTRAUTERINE DEVICE (IUD) REMOVAL;  Surgeon: Conard Novak, MD;  Location: [facility] OR[facility];  Service: Gynecology;  Laterality: N/A;  . LAPAROSCOPY N/A 04/26/2016   Procedure: LAPAROSCOPY OPERATIVE;  Surgeon: Conard Novak, MD;  Location: [facility] OR[facility];  Service: Gynecology;  Laterality: N/A;  . nexplanon  2020    Gynecologic History: No LMP recorded (lmp unknown). Patient is pregnant.  Obstetric History: [id number]  Family History  Problem Relation Age of Onset  . Diabetes Maternal Grandfather     Social History   Socioeconomic History  . Marital status: Married    Spouse name: Anael Rosch  . Number of children: 2  . Years of education: Not on file  . Highest education level: Not on file  Occupational History  . 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 Smoking status: Never Smoker  . Smokeless  tobacco: Never Used  Vaping Use  . Vaping Use: Never used  Substance and Sexual Activity  . Alcohol use: No  . Drug use: No  . Sexual activity: Yes    Birth control/protection: None  Other Topics Concern  . Not on file  Social History Narrative   Out of work due to [employer]   Social Determinants of Health   Financial Resource Strain: Not on file  Food Insecurity: Not on file  Transportation Needs: Not on file  Physical Activity: Not on file  Stress: Not on file  Social Connections: Not on file  Intimate Partner Violence: Not on file    No Known Allergies  Prior to Admission medications   PNV    Review of Systems  Constitutional: Negative.   HENT: Negative.   Eyes: Negative.   Respiratory: Negative.   Cardiovascular: Negative.   Gastrointestinal: Positive for nausea and vomiting. Negative for abdominal pain, blood in stool, constipation, diarrhea, heartburn and melena.  Genitourinary: Negative.   Musculoskeletal: Negative.   Skin: Negative.   Neurological: Positive for dizziness. Negative for tingling, tremors, sensory change, speech change, focal weakness, seizures, loss of consciousness, weakness and headaches.  Psychiatric/Behavioral: Negative.      Physical Exam BP 126/75   Wt 186 lb (84.4 kg)   LMP  (LMP Unknown)   BMI 30.95 kg/m  No LMP recorded (lmp unknown). Patient  is pregnant. Physical Exam Constitutional:      General: She is not in acute distress.    Appearance: Normal appearance.  HENT:     Head: Normocephalic and atraumatic.  Eyes:     General: No scleral icterus.    Conjunctiva/sclera: Conjunctivae normal.  Neurological:     General: No focal deficit present.     Mental Status: She is alert and oriented to person, place, and time.     Cranial Nerves: No cranial nerve deficit.  Psychiatric:        Mood and Affect: Mood normal.        Behavior: Behavior normal.        Judgment: Judgment normal.     Female chaperone present for pelvic  and breast  portions of the physical exam  Assessment: 35 y.o. [id number] female here for  1. Supervision of high risk pregnancy, antepartum   2. Nausea and vomiting during pregnancy      Plan: Problem List Items Addressed This Visit      Digestive   Nausea and vomiting during pregnancy   Relevant Medications   Doxylamine-Pyridoxine (DICLEGIS) 10-10 MG TBEC   metoCLOPramide (REGLAN) 10 MG tablet     Other   Supervision of high risk pregnancy, antepartum - Primary   Relevant Medications   Doxylamine-Pyridoxine (DICLEGIS) 10-10 MG TBEC   metoCLOPramide (REGLAN) 10 MG tablet     Recommend she maintain hydration, as well as possible with water and some gatorade. Recommend PPI (prilosec), vitamin B6, and doxylamine.  I also prescribed Diclegis. If this prescription goes through, she may discontinue B6/doxylamine. Rx for reglan.   Keep follow up in about a week for ultrasound.  Precautions for letting us know she is becoming dehydrated (unable to to keep any fluid or food down).  A total of 22 minutes were spent face-to-face with the patient as well as preparation, review, communication, and documentation during this encounter.    [name], MD 06/15/2020 12:26 PM

## 2020-06-16 ENCOUNTER — Telehealth: Payer: Self-pay

## 2020-06-16 ENCOUNTER — Other Ambulatory Visit: Payer: Self-pay

## 2020-06-16 DIAGNOSIS — O210 Mild hyperemesis gravidarum: Secondary | ICD-10-CM | POA: Insufficient documentation

## 2020-06-16 DIAGNOSIS — Z3A01 Less than 8 weeks gestation of pregnancy: Secondary | ICD-10-CM | POA: Insufficient documentation

## 2020-06-16 LAB — COMPREHENSIVE METABOLIC PANEL
ALT: 23 U/L (ref 0–44)
AST: 18 U/L (ref 15–41)
Albumin: 4.1 g/dL (ref 3.5–5.0)
Alkaline Phosphatase: 51 U/L (ref 38–126)
Anion gap: 9 (ref 5–15)
BUN: 5 mg/dL — ABNORMAL LOW (ref 6–20)
CO2: 19 mmol/L — ABNORMAL LOW (ref 22–32)
Calcium: 8.9 mg/dL (ref 8.9–10.3)
Chloride: 107 mmol/L (ref 98–111)
Creatinine, Ser: 0.57 mg/dL (ref 0.44–1.00)
GFR, Estimated: >60 mL/min (ref >60)
Glucose, Bld: 106 mg/dL — ABNORMAL HIGH (ref 70–99)
Potassium: 3.6 mmol/L (ref 3.5–5.1)
Sodium: 135 mmol/L (ref 135–145)
Total Bilirubin: 0.8 mg/dL (ref 0.3–1.2)
Total Protein: 7.2 g/dL (ref 6.5–8.1)

## 2020-06-16 LAB — CBC
HCT: 38.1 % (ref 36.0–46.0)
Hemoglobin: 13.1 g/dL (ref 12.0–15.0)
MCH: 30.4 pg (ref 26.0–34.0)
MCHC: 34.4 g/dL (ref 30.0–36.0)
MCV: 88.4 fL (ref 80.0–100.0)
Platelets: 261 10*3/uL (ref 150–400)
RBC: 4.31 MIL/uL (ref 3.87–5.11)
RDW: 12.5 % (ref 11.5–15.5)
WBC: 10 10*3/uL (ref 4.0–10.5)
nRBC: 0 % (ref 0.0–0.2)

## 2020-06-16 LAB — URINALYSIS, COMPLETE (UACMP) WITH MICROSCOPIC
Bilirubin Urine: NEGATIVE
Glucose, UA: NEGATIVE mg/dL
Hgb urine dipstick: NEGATIVE
Ketones, ur: 80 mg/dL — AB
Nitrite: NEGATIVE
Protein, ur: 30 mg/dL — AB
Specific Gravity, Urine: 1.031 — ABNORMAL HIGH (ref 1.005–1.030)
pH: 5 (ref 5.0–8.0)

## 2020-06-16 LAB — HCG, QUANTITATIVE, PREGNANCY: hCG, Beta Chain, Quant, S: 196674 m[IU]/mL — ABNORMAL HIGH (ref <5)

## 2020-06-16 LAB — LIPASE, BLOOD: Lipase: 34 U/L (ref 11–51)

## 2020-06-16 NOTE — Telephone Encounter (Signed)
Pt called after hour nurse 06/15/20 10:05pm stating she was seen today for dehydration; can't stop vomiting; feels very weak; states when she vomited she had some burning and now her chest hurts; not sure how far along she is.  After hour nurse adv her to go to ED.  [phone]  Pt states she hasn't vomited since 9pm last night but feels very weak; hasn't gone to ED yet.  Was able to go to sleep but didn't sleep well.  Adv to hold ice in her mouth, popcicles, take a few sips every few minutes; if that weak should go to ED as they can give her fluids and we cannot.

## 2020-06-16 NOTE — ED Triage Notes (Signed)
First Nurse Note:  C/O vomiting x 2 days.  Early pregnancy, approx 7 weeks.  OB provider is [facility].  States given medication , but unable to keep meds down.  G3 P2.  AAOx3.  Skin warm and dry. NAD

## 2020-06-16 NOTE — ED Triage Notes (Signed)
Pt comes with c/o nausea and vomiting for few days. Pt states she is [redacted] weeks pregnant about. Pt states she was informed to come here for evaluating.

## 2020-06-17 ENCOUNTER — Emergency Department
Admission: EM | Admit: 2020-06-17 | Discharge: 2020-06-17 | Disposition: A | Discharge status: Home / Self Care | Payer: Medicaid Other | Attending: Emergency Medicine | Admitting: Emergency Medicine

## 2020-06-17 DIAGNOSIS — O210 Mild hyperemesis gravidarum: Secondary | ICD-10-CM

## 2020-06-17 MED ORDER — PROMETHAZINE HCL 25 MG RE SUPP: 25.0000 mg | Freq: Four times a day (QID) | RECTAL | 0 refills | Status: DC | PRN

## 2020-06-17 MED ORDER — SODIUM CHLORIDE 0.9 % IV BOLUS
1000.0000 mL | Freq: Once | INTRAVENOUS | Status: AC
  Administered 2020-06-17: 1000 mL via INTRAVENOUS

## 2020-06-17 MED ORDER — PROMETHAZINE HCL 25 MG PO TABS: 25.0000 mg | ORAL_TABLET | Freq: Four times a day (QID) | ORAL | 0 refills | Status: DC | PRN

## 2020-06-17 MED ORDER — SODIUM CHLORIDE 0.9 % IV SOLN
12.5000 mg | Freq: Four times a day (QID) | INTRAVENOUS | Status: DC | PRN
  Administered 2020-06-17: 12.5 mg via INTRAVENOUS
  Filled 2020-06-17: qty 0.5

## 2020-06-17 NOTE — ED Provider Notes (Signed)
[facility] Emergency Department Provider Note  Time seen: 2:05 AM  I have reviewed the triage vital signs and the nursing notes.   HISTORY  Chief Complaint Nausea and Emesis   HPI Kari Frost is a 35 y.o. female G4, P2 A1 approximately [redacted] weeks pregnant who presents to the emergency department for nausea and vomiting.  According to the patient she has had nausea and vomiting throughout this pregnancy.  She follows up with [facility].  She was prescribed Reglan which she has tried to use but states she vomits every time she attempts to take the pill.  States yesterday and today she began feeling lightheaded and dizzy she called her OB today who recommended she come to the emergency department for evaluation.  Patient denies any abdominal pain vaginal bleeding or discharge.  Largely negative review of systems otherwise.   Past Medical History:  Diagnosis Date  . Genital herpes   . Thyroid disease   . UTI (urinary tract infection)     Patient Active Problem List   Diagnosis Date Noted  . Nausea and vomiting during pregnancy 06/15/2020  . Supervision of high risk pregnancy, antepartum 06/13/2020  . Obesity affecting pregnancy 06/13/2020  . Herpes virus infection during pregnancy 06/13/2020  . Mood disorder (HCC) 06/27/2018  . 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 Class 1 obesity due to excess calories without serious comorbidity with body mass index (BMI) of 30.0 to 30.9 in adult 10/14/2017  . Pure hypercholesterolemia 10/14/2017  . Displacement of intrauterine contraceptive device 04/10/2016  . Thyroid enlargement 11/30/2014  . Genital herpes simplex type 2 10/12/2014    Past Surgical History:  Procedure Laterality Date  . INTRAUTERINE DEVICE (IUD) INSERTION  2018  . INTRAUTERINE DEVICE (IUD) INSERTION N/A 04/26/2016   Procedure: INTRAUTERINE DEVICE (IUD) INSERTION;   Surgeon: Conard Novak, MD;  Location: [facility] ORS;  Service: Gynecology;  Laterality: N/A;  lot tu01sce exp 09/012020 mirena  . IUD REMOVAL N/A 04/26/2016   Procedure: INTRAUTERINE DEVICE (IUD) REMOVAL;  Surgeon: Conard Novak, MD;  Location: [facility] ORS;  Service: Gynecology;  Laterality: N/A;  . LAPAROSCOPY N/A 04/26/2016   Procedure: LAPAROSCOPY OPERATIVE;  Surgeon: Conard Novak, MD;  Location: [facility] ORS;  Service: Gynecology;  Laterality: N/A;  . nexplanon  2020    Prior to Admission medications   Medication Sig Start Date End Date Taking? Authorizing Provider  Doxylamine-Pyridoxine (DICLEGIS) 10-10 MG TBEC Take 2 tabs po QHS every day, on day 3 if may add one tab po in the morning, on day 4 may add one tab in the afternoon (total of 4 tabs per day) 06/15/20   Conard Novak, MD  metoCLOPramide (REGLAN) 10 MG tablet Take 1 tablet (10 mg total) by mouth every 8 (eight) hours as needed for nausea or vomiting. 06/15/20   Conard Novak, MD    No Known Allergies  Family History  Problem Relation Age of Onset  . Diabetes Maternal Grandfather     Social History Social History   Tobacco Use  . Smoking status: Never Smoker  . Smokeless tobacco: Never Used  Vaping Use  . Vaping Use: Never used  Substance Use Topics  . Alcohol use: No  . Drug use: No    Review of Systems Constitutional: Negative for fever.  Positive for lightheadedness today. Cardiovascular: Negative for chest pain. Respiratory: Negative for shortness of breath. Gastrointestinal: Negative for abdominal pain.  Positive for nausea vomiting.  Negative for diarrhea. Genitourinary: Negative for urinary compaints Musculoskeletal: Negative for musculoskeletal complaints Neurological: Negative for headache All other ROS negative  ____________________________________________   PHYSICAL EXAM:  VITAL SIGNS: ED Triage Vitals  Enc Vitals Group     BP 06/16/20 1736 101/71     Pulse Rate 06/16/20 1736 94      Resp 06/16/20 1736 18     Temp 06/16/20 1736 98.4 F (36.9 C)     Temp src --      SpO2 06/16/20 1736 100 %     Weight --      Height --      Head Circumference --      Peak Flow --      Pain Score 06/16/20 1735 0     Pain Loc --      Pain Edu? --      Excl. in GC? --     Constitutional: Alert and oriented. Well appearing and in no distress. Eyes: Normal exam ENT      Head: Normocephalic and atraumatic.      Mouth/Throat: Dry mucous membranes. Cardiovascular: Normal rate, regular rhythm. No murmurs, rubs, or gallops. Respiratory: Normal respiratory effort without tachypnea nor retractions. Breath sounds are clear and equal bilaterally. No wheezes/rales/rhonchi. Gastrointestinal: Soft and nontender. No distention.   Musculoskeletal: Nontender with normal range of motion in all extremities.  Neurologic:  Normal speech and language. No gross focal neurologic deficits Skin:  Skin is warm, dry and intact.  Psychiatric: Mood and affect are normal.   ____________________________________________    INITIAL IMPRESSION / ASSESSMENT AND PLAN / ED COURSE  Pertinent labs & imaging results that were available during my care of the patient were reviewed by me and considered in my medical decision making (see chart for details).   Patient presents emergency department for nausea and vomiting over the past several weeks.  Patient states it is gotten worse over the past few days and she is not able to keep anything down including her Reglan.  Patient has a benign abdominal exam.  Patient's lab work is consistent with dehydration with 80 ketones in her urinalysis.  We will IV hydrate with 2 L of normal saline.  I had a discussion with the patient regarding the safety and efficacy of various nausea medications.  Patient states she has tried Reglan as well as Zofran without success.  We will try Phenergan in the emergency department.  Patient agreeable to Phenergan use during  pregnancy.  Patient is feeling better after 2 L of fluid.  States the Phenergan has helped her nausea but continues to be mildly nauseated.  We will discharge patient home with both oral as well as suppository forms of Phenergan and have the patient follow-up with her OB/GYN.  Patient agreeable to plan.  Jaynee Winters was evaluated in Emergency Department on 06/17/2020 for the symptoms described in the history of present illness. She was evaluated in the context of the global COVID-19 pandemic, which necessitated consideration that the patient might be at risk for infection with the SARS-CoV-2 virus that causes COVID-19. Institutional protocols and algorithms that pertain to the evaluation of patients at risk for COVID-19 are in a state of rapid change based on information released by regulatory bodies including the CDC and federal and state organizations. These policies and algorithms were followed during the patient's care in the ED.  ____________________________________________   FINAL CLINICAL IMPRESSION(S) / ED DIAGNOSES  Hyperemesis gravidarum   Itai Barbian,  Caryn Bee, MD 06/17/20 [phone]

## 2020-06-17 NOTE — ED Notes (Signed)
Patient is resting comfortably. 

## 2020-06-18 LAB — URINE CULTURE: Special Requests: NORMAL

## 2020-06-24 ENCOUNTER — Ambulatory Visit (INDEPENDENT_AMBULATORY_CARE_PROVIDER_SITE_OTHER): Payer: BC Managed Care – PPO | Admitting: Obstetrics and Gynecology

## 2020-06-24 ENCOUNTER — Other Ambulatory Visit: Payer: Self-pay

## 2020-06-24 ENCOUNTER — Encounter: Payer: Self-pay | Admitting: Obstetrics and Gynecology

## 2020-06-24 VITALS — BP 120/80 | Wt 184.0 lb

## 2020-06-24 DIAGNOSIS — Z3A09 9 weeks gestation of pregnancy: Secondary | ICD-10-CM | POA: Diagnosis not present

## 2020-06-24 DIAGNOSIS — O210 Mild hyperemesis gravidarum: Secondary | ICD-10-CM | POA: Diagnosis not present

## 2020-06-24 DIAGNOSIS — O99211 Obesity complicating pregnancy, first trimester: Secondary | ICD-10-CM

## 2020-06-24 DIAGNOSIS — O98511 Other viral diseases complicating pregnancy, first trimester: Secondary | ICD-10-CM

## 2020-06-24 DIAGNOSIS — O0991 Supervision of high risk pregnancy, unspecified, first trimester: Secondary | ICD-10-CM

## 2020-06-24 DIAGNOSIS — B009 Herpesviral infection, unspecified: Secondary | ICD-10-CM

## 2020-06-24 DIAGNOSIS — O0990 Supervision of high risk pregnancy, unspecified, unspecified trimester: Secondary | ICD-10-CM

## 2020-06-24 DIAGNOSIS — O219 Vomiting of pregnancy, unspecified: Secondary | ICD-10-CM

## 2020-06-24 LAB — POCT URINALYSIS DIPSTICK OB: Glucose, UA: NEGATIVE

## 2020-06-24 NOTE — Progress Notes (Signed)
  Routine Prenatal Care Visit  Subjective  Kari Frost is a 35 y.o. [id number] at [redacted]w[redacted]d being seen today for ongoing prenatal care.  She is currently monitored for the following issues for this low-risk pregnancy and has Thyroid enlargement; Displacement of intrauterine contraceptive device; Current severe episode of major depressive disorder without psychotic features without prior episode (HCC); Class 1 obesity due to excess calories without serious comorbidity with body mass index (BMI) of 30.0 to 30.9 in adult; Pure hypercholesterolemia; Mood disorder (HCC); Genital herpes simplex type 2; Supervision of high risk pregnancy, antepartum; Obesity affecting pregnancy; Herpes virus infection during pregnancy; and Nausea and vomiting during pregnancy on their problem list.  ----------------------------------------------------------------------------------- Patient reports nausea. She went to ER and was given Phenergan, which she has not tried yet.    . Vag. Bleeding: None.  Movement: Absent. Leaking Fluid denies.  ----------------------------------------------------------------------------------- The following portions of the patient's history were reviewed and updated as appropriate: allergies, current medications, past family history, past medical history, past social history, past surgical history and problem list. Problem list updated.  Objective  Blood pressure 120/80, weight 184 lb (83.5 kg), last menstrual period 03/07/2020, unknown if currently breastfeeding. Pregravid weight 190 lb (86.2 kg) Total Weight Gain -6 lb (-2.722 kg) Urinalysis: Urine Protein Trace  Urine Glucose Negative  Fetal Status: Fetal Heart Rate (bpm): 172   Movement: Absent     General:  Alert, oriented and cooperative. Patient is in no acute distress.  Skin: Skin is warm and dry. No rash noted.   Cardiovascular: Normal heart rate noted  Respiratory: Normal respiratory effort, no problems with respiration noted   Abdomen: Soft, gravid, appropriate for gestational age. Pain/Pressure: Absent     Pelvic:  Cervical exam deferred        Extremities: Normal range of motion.     Mental Status: Normal mood and affect. Normal behavior. Normal judgment and thought content.   Assessment   35 y.o. [id number] at [redacted]w[redacted]d by  01/23/2021, by Ultrasound presenting for routine prenatal visit  Plan   pregnancy Problems (from 06/13/20 to present)    Problem Noted Resolved   Nausea and vomiting during pregnancy 06/15/2020 by Conard Novak, MD No   Supervision of high risk pregnancy, antepartum 06/13/2020 by Conard Novak, MD No   Obesity affecting pregnancy 06/13/2020 by Conard Novak, MD No   Herpes virus infection during pregnancy 06/13/2020 by Conard Novak, MD No   Overview Signed 06/13/2020  3:40 PM by Conard Novak, MD    [ ]  ppx at 36 wks          Preterm labor symptoms and general obstetric precautions including but not limited to vaginal bleeding, contractions, leaking of fluid and fetal movement were reviewed in detail with the patient. Please refer to After Visit Summary for other counseling recommendations.   Transabdominal ultrasound performed today which changes estimated due date.  See report under notes section for details.  Return in about 4 weeks (around 07/22/2020) for Routine Prenatal Appointment.   07/24/2020, MD, [name] OB/[facility]N, [facility] 06/24/2020 6:27 PM

## 2020-06-24 NOTE — Progress Notes (Signed)
ULTRASOUND REPORT  Location: [facility] Date of Service: 06/24/2020   Indications:dating Findings:  Mason Jim intrauterine pregnancy is visualized with a CRL consistent with [redacted]w[redacted]d gestation, giving an (U/S) EDD of 01/23/2021. The (U/S) EDD is not consistent with the clinically established EDD of 12/12/2020.  FHR: 170 BPM CRL measurement: 2.7 cm Yolk sac is visualized and appears normal. Amnion: not visualized   Right Ovary is normal in appearance. Left Ovary is normal appearance. Corpus luteal cyst:  is not visualized Survey of the adnexa demonstrates no adnexal masses. There is no free peritoneal fluid in the cul de sac.  Impression: 1. [redacted]w[redacted]d Viable Singleton Intrauterine pregnancy by U/S. 2. (U/S) EDD is not consistent with Clinically established EDD of 12/12/2020.  New EDD is 01/23/2021 by today's ultrasound.  There is a viable singleton gestation.  Detailed evaluation of the fetal anatomy is precluded by early gestational age.  It must be noted that a normal ultrasound particular at this early gestational age is unable to rule out fetal aneuploidy, risk of first trimester miscarriage, or anatomic birth defects. I performed the ultrasound and interpreted the images.   [name], MD, Merlinda Frederick [facility], [facility] 06/24/2020 6:29 PM

## 2020-07-07 ENCOUNTER — Telehealth: Payer: Self-pay

## 2020-07-07 NOTE — Telephone Encounter (Signed)
Pt calling; can she have rx for zofran?  Gets sick c other medicines.  [phone]

## 2020-07-08 ENCOUNTER — Other Ambulatory Visit: Payer: Self-pay | Admitting: Obstetrics and Gynecology

## 2020-07-08 DIAGNOSIS — O219 Vomiting of pregnancy, unspecified: Secondary | ICD-10-CM

## 2020-07-08 MED ORDER — ONDANSETRON 8 MG PO TBDP: 8.0000 mg | ORAL_TABLET | Freq: Three times a day (TID) | ORAL | 0 refills | Status: DC | PRN

## 2020-07-08 NOTE — Telephone Encounter (Signed)
Pt aware.

## 2020-07-12 ENCOUNTER — Telehealth: Payer: Self-pay

## 2020-07-12 NOTE — Telephone Encounter (Signed)
Pt calling; Is 12 wks; last night has period like cramps for about an hour that were very painful. Should she be concerned?  [phone]  Adv pt cramping is normal through out the whole preg as long as it doesn't make her double over or stop her in her tracks.  Pt states she is not having them today.  Pt concerned b/c she had a miscarriage earlier this year.  Pt asked if being constipated could cause the cramping; adv yes; may take colace which will work better if drinking 64oz of water a day and eating fresh fruits and vegetables; pts have good results c prunes/prune juice and some heat the prune juice. Also adv may need to increase water intake by a quart.

## 2020-07-25 ENCOUNTER — Encounter: Payer: Self-pay | Admitting: Obstetrics and Gynecology

## 2020-07-25 ENCOUNTER — Other Ambulatory Visit: Payer: Self-pay

## 2020-07-25 ENCOUNTER — Ambulatory Visit (INDEPENDENT_AMBULATORY_CARE_PROVIDER_SITE_OTHER): Payer: BC Managed Care – PPO | Admitting: Obstetrics and Gynecology

## 2020-07-25 VITALS — BP 114/70

## 2020-07-25 DIAGNOSIS — O98512 Other viral diseases complicating pregnancy, second trimester: Secondary | ICD-10-CM

## 2020-07-25 DIAGNOSIS — B009 Herpesviral infection, unspecified: Secondary | ICD-10-CM

## 2020-07-25 DIAGNOSIS — O99212 Obesity complicating pregnancy, second trimester: Secondary | ICD-10-CM

## 2020-07-25 DIAGNOSIS — O0992 Supervision of high risk pregnancy, unspecified, second trimester: Secondary | ICD-10-CM

## 2020-07-25 DIAGNOSIS — Z3A14 14 weeks gestation of pregnancy: Secondary | ICD-10-CM

## 2020-07-25 LAB — POCT URINALYSIS DIPSTICK OB
Glucose, UA: NEGATIVE
POC,PROTEIN,UA: NEGATIVE

## 2020-07-25 NOTE — Progress Notes (Signed)
  Routine Prenatal Care Visit  Subjective  Kari Frost is a 35 y.o. [id number] at [redacted]w[redacted]d being seen today for ongoing prenatal care.  She is currently monitored for the following issues for this high-risk pregnancy and has Thyroid enlargement; Displacement of intrauterine contraceptive device; Current severe episode of major depressive disorder without psychotic features without prior episode (HCC); Class 1 obesity due to excess calories without serious comorbidity with body mass index (BMI) of 30.0 to 30.9 in adult; Pure hypercholesterolemia; Mood disorder (HCC); Genital herpes simplex type 2; Supervision of high risk pregnancy, antepartum; Obesity affecting pregnancy; Herpes virus infection during pregnancy; and Nausea and vomiting during pregnancy on their problem list.  ----------------------------------------------------------------------------------- Patient reports no complaints.    . Vag. Bleeding: None.  Movement: Absent. Leaking Fluid denies.  ----------------------------------------------------------------------------------- The following portions of the patient's history were reviewed and updated as appropriate: allergies, current medications, past family history, past medical history, past social history, past surgical history and problem list. Problem list updated.  Objective  Blood pressure 114/70, last menstrual period 03/07/2020, unknown if currently breastfeeding. Pregravid weight 190 lb (86.2 kg) Total Weight Gain -6 lb (-2.722 kg) Urinalysis: Urine Protein    Urine Glucose    Fetal Status: Fetal Heart Rate (bpm): 140   Movement: Absent     General:  Alert, oriented and cooperative. Patient is in no acute distress.  Skin: Skin is warm and dry. No rash noted.   Cardiovascular: Normal heart rate noted  Respiratory: Normal respiratory effort, no problems with respiration noted  Abdomen: Soft, gravid, appropriate for gestational age. Pain/Pressure: Absent     Pelvic:  Cervical  exam deferred        Extremities: Normal range of motion.     Mental Status: Normal mood and affect. Normal behavior. Normal judgment and thought content.   Assessment   35 y.o. [id number] at [redacted]w[redacted]d by  01/23/2021, by Ultrasound presenting for routine prenatal visit  Plan   pregnancy Problems (from 06/13/20 to present)     Problem Noted Resolved   Nausea and vomiting during pregnancy 06/15/2020 by Conard Novak, MD No   Supervision of high risk pregnancy, antepartum 06/13/2020 by Conard Novak, MD No   Obesity affecting pregnancy 06/13/2020 by Conard Novak, MD No   Herpes virus infection during pregnancy 06/13/2020 by Conard Novak, MD No   Overview Signed 06/13/2020  3:40 PM by Conard Novak, MD    [ ]  ppx at 36 wks            Preterm labor symptoms and general obstetric precautions including but not limited to vaginal bleeding, contractions, leaking of fluid and fetal movement were reviewed in detail with the patient. Please refer to After Visit Summary for other counseling recommendations.   NOB labs today  Return in about 4 weeks (around 08/22/2020) for Routine Prenatal Appointment.   08/24/2020, MD, [name] OB/[facility], [facility] 07/25/2020 10:00 AM

## 2020-07-26 LAB — HEMOGLOBIN A1C
Est. average glucose Bld gHb Est-mCnc: 94 mg/dL
Hgb A1c MFr Bld: 4.9 % (ref 4.8–5.6)

## 2020-07-28 ENCOUNTER — Telehealth: Payer: Self-pay

## 2020-07-28 NOTE — Telephone Encounter (Signed)
Pt calling; 14wks; feels really weak; can't eat, throws up and gags all day long; trying to drink water but cannot keep food down; talked c SDJ Mon about this; is not improving.  [phone]  Adv pt if feeling that bad and weak she needs to go to the ED.

## 2020-08-05 NOTE — Addendum Note (Signed)
Addended by: [name] D on: 08/05/2020 09:27 AM   Modules accepted: Orders

## 2020-08-22 ENCOUNTER — Other Ambulatory Visit: Payer: Self-pay

## 2020-08-22 ENCOUNTER — Ambulatory Visit (INDEPENDENT_AMBULATORY_CARE_PROVIDER_SITE_OTHER): Payer: BC Managed Care – PPO | Admitting: Obstetrics and Gynecology

## 2020-08-22 VITALS — BP 112/68 | Wt 165.0 lb

## 2020-08-22 DIAGNOSIS — O0992 Supervision of high risk pregnancy, unspecified, second trimester: Secondary | ICD-10-CM

## 2020-08-22 DIAGNOSIS — O0990 Supervision of high risk pregnancy, unspecified, unspecified trimester: Secondary | ICD-10-CM

## 2020-08-22 DIAGNOSIS — Z3A18 18 weeks gestation of pregnancy: Secondary | ICD-10-CM

## 2020-08-22 LAB — POCT URINALYSIS DIPSTICK OB
Glucose, UA: NEGATIVE
POC,PROTEIN,UA: NEGATIVE

## 2020-08-22 NOTE — Progress Notes (Addendum)
    Routine Prenatal Care Visit  Subjective  Kari Frost is a 35 y.o. K2H0623 at [redacted]w[redacted]d being seen today for ongoing prenatal care.  She is currently monitored for the following issues for this low-risk pregnancy and has Thyroid enlargement; Displacement of intrauterine contraceptive device; Current severe episode of major depressive disorder without psychotic features without prior episode (HCC); Class 1 obesity due to excess calories without serious comorbidity with body mass index (BMI) of 30.0 to 30.9 in adult; Pure hypercholesterolemia; Mood disorder (HCC); Genital herpes simplex type 2; Supervision of high risk pregnancy, antepartum; Obesity affecting pregnancy; Herpes virus infection during pregnancy; and Nausea and vomiting during pregnancy on their problem list.  ----------------------------------------------------------------------------------- Patient reports no complaints.   Contractions: Not present. Vag. Bleeding: None.  Movement: Present. Denies leaking of fluid.  ----------------------------------------------------------------------------------- The following portions of the patient's history were reviewed and updated as appropriate: allergies, current medications, past family history, past medical history, past social history, past surgical history and problem list. Problem list updated.   Objective  Blood pressure 112/68, weight 165 lb (74.8 kg), last menstrual period 03/07/2020, unknown if currently breastfeeding. Pregravid weight 190 lb (86.2 kg) Total Weight Gain -25 lb (-11.3 kg) Body mass index is 27.46 kg/m.  Urinalysis:      Fetal Status: Fetal Heart Rate (bpm): 145   Movement: Present     General:  Alert, oriented and cooperative. Patient is in no acute distress.  Skin: Skin is warm and dry. No rash noted.   Cardiovascular: Normal heart rate noted  Respiratory: Normal respiratory effort, no problems with respiration noted  Abdomen: Soft, gravid, appropriate for  gestational age. Pain/Pressure: Absent     Pelvic:  Cervical exam deferred        Extremities: Normal range of motion.     ental Status: Normal mood and affect. Normal behavior. Normal judgment and thought content.     Assessment   35 y.o. [id number] at [redacted]w[redacted]d by  01/23/2021, by Ultrasound presenting for routine prenatal visit  Plan   pregnancy Problems (from 06/13/20 to present)     Problem Noted Resolved   Nausea and vomiting during pregnancy 06/15/2020 by Conard Novak, MD No   Supervision of high risk pregnancy, antepartum 06/13/2020 by Conard Novak, MD No   Obesity affecting pregnancy 06/13/2020 by Conard Novak, MD No   Herpes virus infection during pregnancy 06/13/2020 by Conard Novak, MD No   Overview Signed 06/13/2020  3:40 PM by Conard Novak, MD    [ ]  ppx at [redacted] wks            Gestational age appropriate obstetric precautions including but not limited to vaginal bleeding, contractions, leaking of fluid and fetal movement were reviewed in detail with the patient.    - Anatomy scan 8/8 - note jury duty 8/25 - NEEDS NOB LABS no lab this morning  Return in about 15 days (around 09/06/2020), or keep previously scheduled ROB.  11/06/2020, MD, Vena Austria [facility], [facility] 08/22/2020, 9:01 AM

## 2020-08-22 NOTE — Progress Notes (Signed)
ROB - discuss loosing so much weight, really sick and can't keep food down. RM 4

## 2020-08-25 ENCOUNTER — Telehealth: Payer: Self-pay

## 2020-08-25 NOTE — Telephone Encounter (Signed)
Pt calling; is 18wks; can't eat but is [place]; everything she eats something she throws it up.  [phone]  Pt states she is sipping on water and keeping it down; adv to sip clear liquids including beef/chicken broth; pt states she is worried about her self b/c she keeps losing weight and is feeling weak.  Adv to go to ED for fluids.

## 2020-09-05 ENCOUNTER — Ambulatory Visit: Payer: BC Managed Care – PPO | Attending: Obstetrics and Gynecology

## 2020-09-05 ENCOUNTER — Other Ambulatory Visit: Payer: Self-pay

## 2020-09-05 ENCOUNTER — Other Ambulatory Visit: Payer: Self-pay | Admitting: *Deleted

## 2020-09-05 ENCOUNTER — Ambulatory Visit: Payer: BC Managed Care – PPO | Admitting: *Deleted

## 2020-09-05 VITALS — BP 107/55 | HR 67

## 2020-09-05 DIAGNOSIS — Z3A20 20 weeks gestation of pregnancy: Secondary | ICD-10-CM

## 2020-09-05 DIAGNOSIS — O99282 Endocrine, nutritional and metabolic diseases complicating pregnancy, second trimester: Secondary | ICD-10-CM | POA: Diagnosis not present

## 2020-09-05 DIAGNOSIS — E079 Disorder of thyroid, unspecified: Secondary | ICD-10-CM

## 2020-09-05 DIAGNOSIS — O0992 Supervision of high risk pregnancy, unspecified, second trimester: Secondary | ICD-10-CM

## 2020-09-05 DIAGNOSIS — O219 Vomiting of pregnancy, unspecified: Secondary | ICD-10-CM | POA: Diagnosis present

## 2020-09-05 DIAGNOSIS — O0990 Supervision of high risk pregnancy, unspecified, unspecified trimester: Secondary | ICD-10-CM | POA: Insufficient documentation

## 2020-09-05 DIAGNOSIS — O99212 Obesity complicating pregnancy, second trimester: Secondary | ICD-10-CM

## 2020-09-05 DIAGNOSIS — B009 Herpesviral infection, unspecified: Secondary | ICD-10-CM

## 2020-09-05 DIAGNOSIS — O98512 Other viral diseases complicating pregnancy, second trimester: Secondary | ICD-10-CM | POA: Diagnosis present

## 2020-09-05 DIAGNOSIS — E669 Obesity, unspecified: Secondary | ICD-10-CM

## 2020-09-05 DIAGNOSIS — O321XX0 Maternal care for breech presentation, not applicable or unspecified: Secondary | ICD-10-CM

## 2020-09-05 IMAGING — US US MFM OB DETAIL+14 WK
1 series · 13 of 28 positions shown · non-contrast
Comparison: none

[Series 1: us mfm ob detail+14 wk · 13 of 115 slices shown]
[im 5/115]
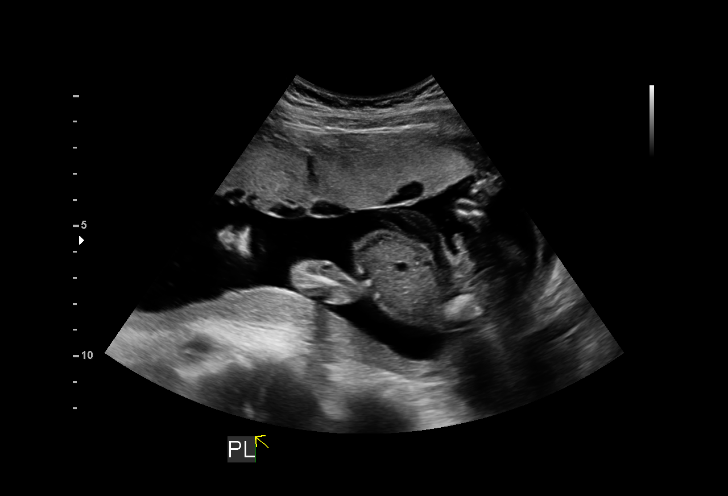
[im 13/115]
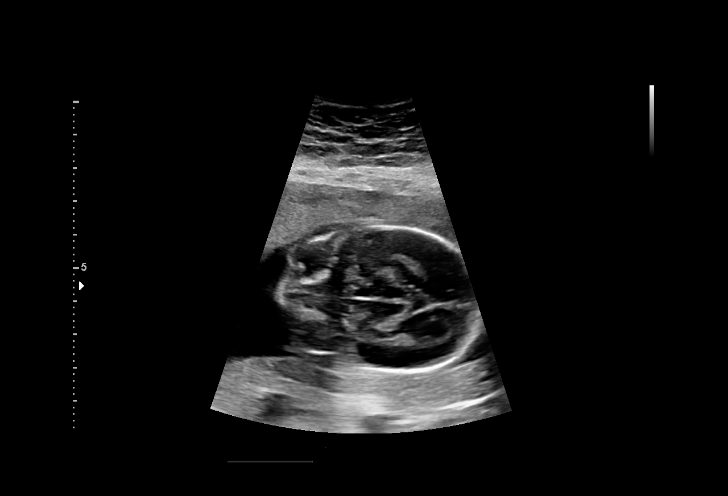
[im 22/115]
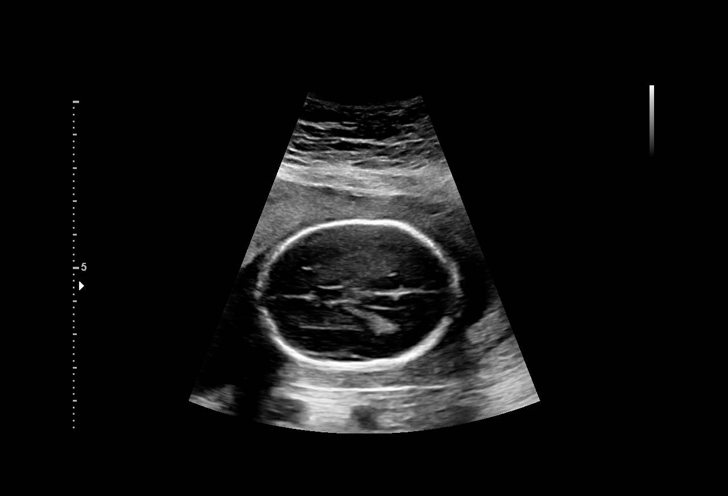
[im 30/115]
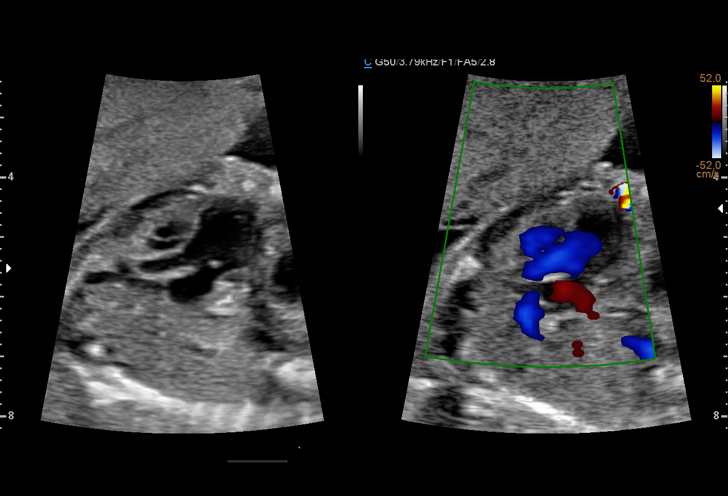
[im 39/115]
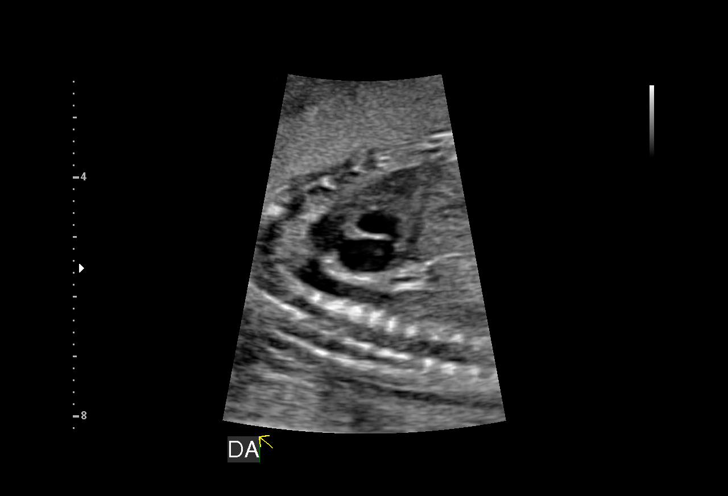
[im 47/115]
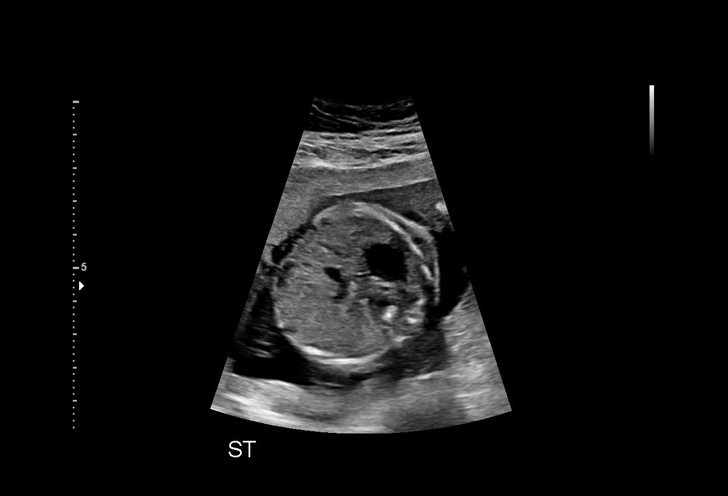
[im 60/115]
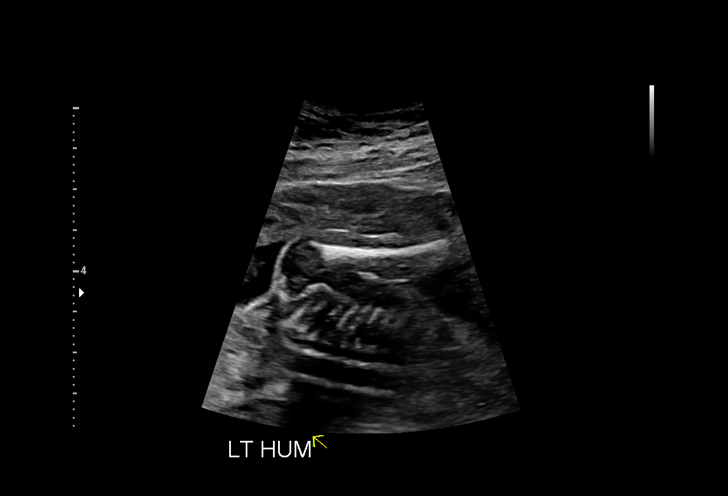
[im 68/115]
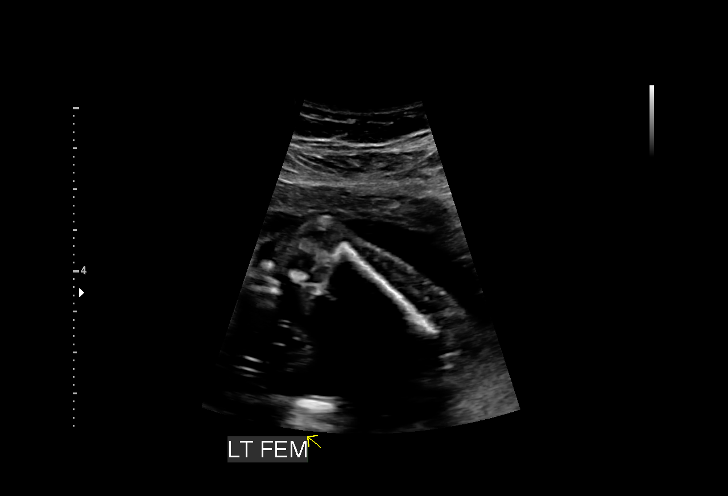
[im 77/115]
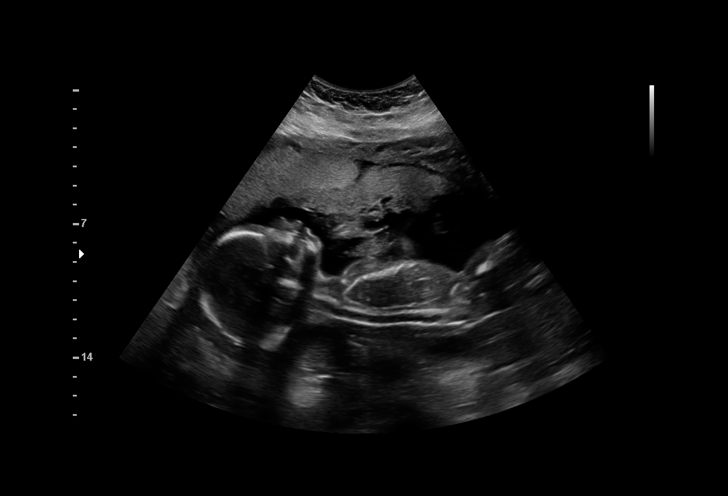
[im 85/115]
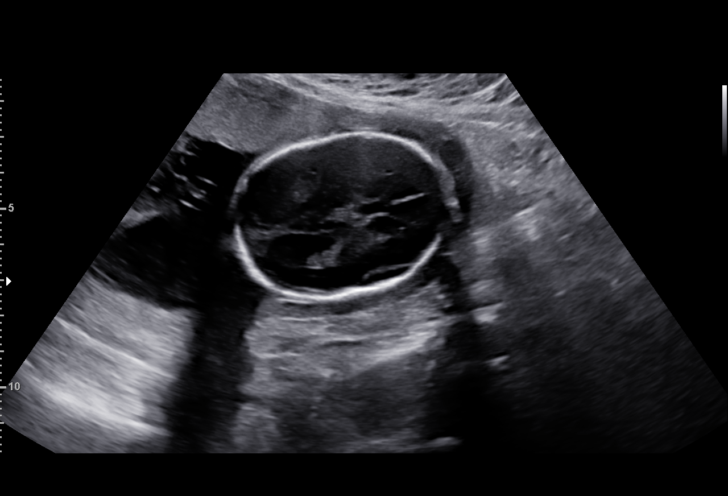
[im 93/115]
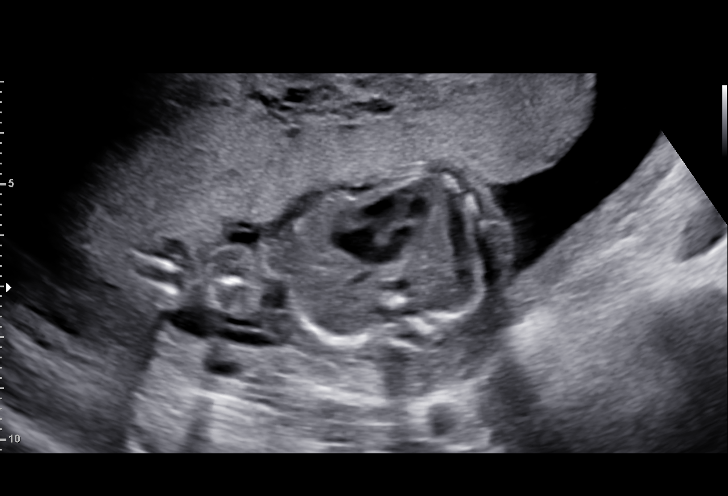
[im 102/115]
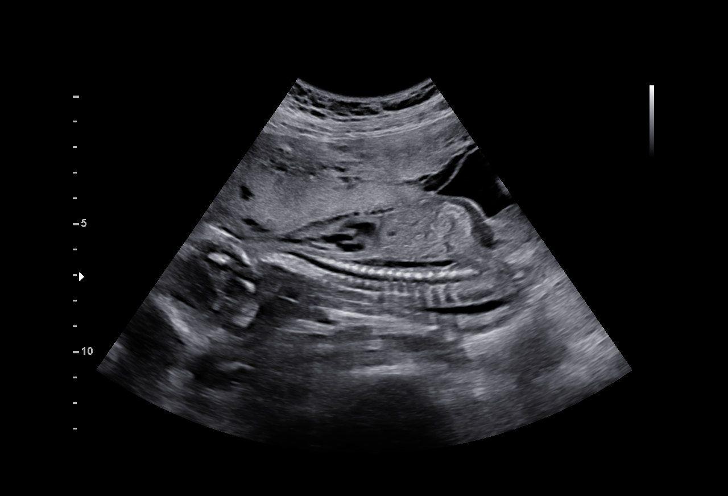
[im 110/115]
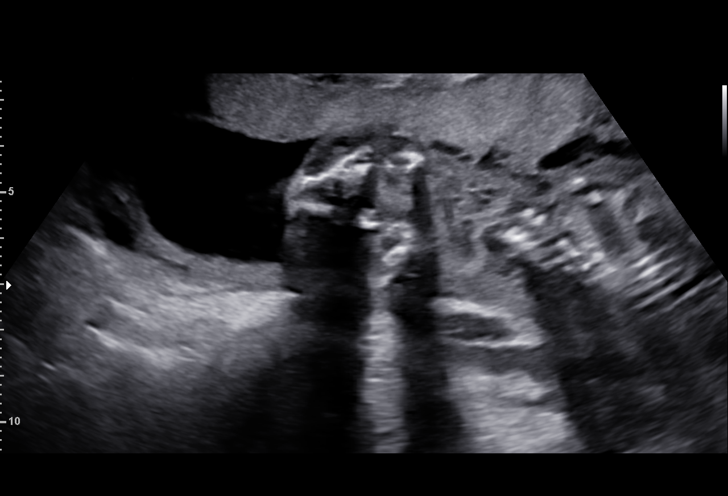

[13 of 28 positions shown; findings below may reference images not displayed]

Indications

 Obesity complicating pregnancy                 [3O] [3O]
 20 weeks gestation of pregnancy
 Thyroid disease in pregnancy                   O99.280, [3O]
Fetal Evaluation

 Num Of Fetuses:         1
 Preg. Location:         Intrauterine
 Fetal Heart Rate(bpm):  147
 Cardiac Activity:       Observed
 Presentation:           Breech
 Placenta:               Anterior
 P. Cord Insertion:      Visualized

 Amniotic Fluid
 AFI FV:      Within normal limits

                             Largest Pocket(cm)

Biometry

 BPD:      43.3  mm     G. Age:  19w 1d         16  %    CI:        64.43   %    70 - 86
                                                         FL/HC:      19.7   %    16.8 -
 HC:      173.5  mm     G. Age:  19w 6d         37  %    HC/AC:      1.12        1.09 -
 AC:      155.5  mm     G. Age:  20w 5d         68  %    FL/BPD:     78.8   %
 FL:       34.1  mm     G. Age:  20w 5d         68  %    FL/AC:      21.9   %    20 - 24
 HUM:      31.9  mm     G. Age:  20w 5d         71  %
 CER:      21.2  mm     G. Age:  20w 1d         77  %
 NFT:       3.8  mm
 LV:        6.5  mm
 CM:        6.5  mm

 Est. FW:     363  gm    0 lb 13 oz      78  %
OB History

 Blood Type:   A+
 Gravidity:    4         Term:   2         SAB:   1
Gestational Age

 LMP:           26w 0d        Date:  [DATE]                 EDD:   [DATE]
 U/S Today:     20w 1d                                        EDD:   [DATE]
 Best:          20w 0d     Det. By:  Early Ultrasound         EDD:   [DATE]
                                     ([DATE])
Anatomy

 Cranium:               Appears normal         LVOT:                   Appears normal
 Cavum:                 Appears normal         Aortic Arch:            Not well visualized
 Ventricles:            Appears normal         Ductal Arch:            Appears normal
 Choroid Plexus:        Appears normal         Diaphragm:              Appears normal
 Cerebellum:            Appears normal         Stomach:                Appears normal, left
                                                                       sided
 Posterior Fossa:       Appears normal         Abdomen:                Appears normal
 Nuchal Fold:           Appears normal         Abdominal Wall:         Appears nml (cord
                                                                       insert, abd wall)
 Face:                  Appears normal         Cord Vessels:           Appears normal (3
                        (orbits and profile)                           vessel cord)
 Lips:                  Appears normal         Kidneys:                Appear normal
 Palate:                Limited Views          Bladder:                Appears normal
 Thoracic:              Appears normal         Spine:                  Not well visualized
 Heart:                 Appears normal         Upper Extremities:      Visualized
                        (4CH, axis, and
                        situs)
 RVOT:                  Appears normal         Lower Extremities:      Visualized

 Other:  VC, 3VV and 3VTV visualized. Normal genaitalia.
Cervix Uterus Adnexa

 Cervix
 Length:            4.5  cm.
 Normal appearance by transabdominal scan.

 Uterus
 No abnormality visualized.

 Right Ovary
 Not visualized.

 Left Ovary
 Not visualized.

 Cul De Sac
 No free fluid seen.
 Adnexa
 No abnormality visualized.
Comments

 This patient was seen for a detailed fetal anatomy scan.  The
 patient reports that due to persistent nausea and vomiting
 early in her current pregnancy, she has been unable to eat
 and has lost about 30 pounds.  Her nausea and vomiting is
 improving slightly now.
 She denies any significant past medical history and denies
 any problems in her current pregnancy.
 She has declined all screening tests for fetal aneuploidy in
 her current pregnancy.
 She was informed that the fetal growth and amniotic fluid
 level were appropriate for her gestational age.
 There were no obvious fetal anomalies noted on today's
 ultrasound exam.  However, the views of the fetal anatomy
 were limited today due to the fetal position.
 The patient was informed that anomalies may be missed due
 to technical limitations. If the fetus is in a suboptimal position
 or maternal habitus is increased, visualization of the fetus in
 the maternal uterus may be impaired.
 A follow-up exam was scheduled in 4 weeks to complete the
 views of the fetal anatomy and to assess the fetal growth.

## 2020-09-06 ENCOUNTER — Encounter: Payer: Self-pay | Admitting: Obstetrics and Gynecology

## 2020-09-06 ENCOUNTER — Ambulatory Visit (INDEPENDENT_AMBULATORY_CARE_PROVIDER_SITE_OTHER): Payer: BC Managed Care – PPO | Admitting: Obstetrics and Gynecology

## 2020-09-06 VITALS — BP 120/80 | Wt 165.0 lb

## 2020-09-06 DIAGNOSIS — O99212 Obesity complicating pregnancy, second trimester: Secondary | ICD-10-CM

## 2020-09-06 DIAGNOSIS — O0992 Supervision of high risk pregnancy, unspecified, second trimester: Secondary | ICD-10-CM

## 2020-09-06 DIAGNOSIS — Z3A20 20 weeks gestation of pregnancy: Secondary | ICD-10-CM

## 2020-09-06 DIAGNOSIS — O98512 Other viral diseases complicating pregnancy, second trimester: Secondary | ICD-10-CM

## 2020-09-06 DIAGNOSIS — B009 Herpesviral infection, unspecified: Secondary | ICD-10-CM

## 2020-09-06 LAB — POCT URINALYSIS DIPSTICK OB
Glucose, UA: NEGATIVE
POC,PROTEIN,UA: NEGATIVE

## 2020-09-06 NOTE — Progress Notes (Signed)
Routine Prenatal Care Visit  Subjective  Kari Frost is a 35 y.o. [id number] at [redacted]w[redacted]d being seen today for ongoing prenatal care.  She is currently monitored for the following issues for this high-risk pregnancy and has Thyroid enlargement; Displacement of intrauterine contraceptive device; Current severe episode of major depressive disorder without psychotic features without prior episode (HCC); Class 1 obesity due to excess calories without serious comorbidity with body mass index (BMI) of 30.0 to 30.9 in adult; Pure hypercholesterolemia; Mood disorder (HCC); Genital herpes simplex type 2; Supervision of high risk pregnancy, antepartum; Obesity affecting pregnancy; Herpes virus infection during pregnancy; and Nausea and vomiting during pregnancy on their problem list.  ----------------------------------------------------------------------------------- Patient reports no complaints.   Contractions: Not present. Vag. Bleeding: None.  Movement: Present. Leaking Fluid denies.  ----------------------------------------------------------------------------------- The following portions of the patient's history were reviewed and updated as appropriate: allergies, current medications, past family history, past medical history, past social history, past surgical history and problem list. Problem list updated.  Objective  Blood pressure 120/80, weight 165 lb (74.8 kg), last menstrual period 03/07/2020, unknown if currently breastfeeding. Pregravid weight 190 lb (86.2 kg) Total Weight Gain -25 lb (-11.3 kg) Urinalysis: Urine Protein Negative  Urine Glucose Negative  Fetal Status: Fetal Heart Rate (bpm): 140   Movement: Present     General:  Alert, oriented and cooperative. Patient is in no acute distress.  Skin: Skin is warm and dry. No rash noted.   Cardiovascular: Normal heart rate noted  Respiratory: Normal respiratory effort, no problems with respiration noted  Abdomen: Soft, gravid, appropriate for  gestational age. Pain/Pressure: Absent     Pelvic:  Cervical exam deferred        Extremities: Normal range of motion.     Mental Status: Normal mood and affect. Normal behavior. Normal judgment and thought content.   Assessment   35 y.o. [id number] at [redacted]w[redacted]d by  01/23/2021, by Ultrasound presenting for routine prenatal visit  Plan   MAY 2022 Problems (from 06/13/20 to present)     Problem Noted Resolved   Nausea and vomiting during pregnancy 06/15/2020 by Conard Novak, MD No   Supervision of high risk pregnancy, antepartum 06/13/2020 by Conard Novak, MD No   Overview Signed 08/28/2020 11:53 AM by Vena Austria, MD     Nursing Staff Provider  Office Location  [facility] Dating  9 week [place]  Language  English Anatomy US    Flu Vaccine   Genetic Screen  NIPS:   TDaP vaccine    Hgb A1C or  GTT HgbA1C:4.9   Third trimester :   Covid    LAB RESULTS   Rhogam   Blood Type     Feeding Plan  Antibody    Contraception  Rubella    Circumcision  RPR     Pediatrician   HBsAg     Support Person  HIV    Prenatal Classes  Varicella     GBS  (For PCN allergy, check sensitivities)   BTL Consent     VBAC Consent  Pap      Hgb Electro      CF      SMA              Obesity affecting pregnancy 06/13/2020 by Conard Novak, MD No   Herpes virus infection during pregnancy 06/13/2020 by Conard Novak, MD No   Overview Signed 06/13/2020  3:40 PM by Conard Novak, MD    [ ]  ppx  at 36 wks            Preterm labor symptoms and general obstetric precautions including but not limited to vaginal bleeding, contractions, leaking of fluid and fetal movement were reviewed in detail with the patient. Please refer to After Visit Summary for other counseling recommendations.   Anatomy u/s incomplete at MFM. Follow up scheduled.   Needs NOB labs. No phlebotomist in clinic today.  Will get at 24 week visit.   Return in about 4 weeks (around 10/04/2020) for Routine Prenatal  Appointment (NOB labs).   [name], MD, [facility] Frederick [facility], [facility] 09/06/2020 9:30 AM

## 2020-10-04 ENCOUNTER — Other Ambulatory Visit: Payer: Self-pay

## 2020-10-04 ENCOUNTER — Ambulatory Visit: Payer: BC Managed Care – PPO | Admitting: *Deleted

## 2020-10-04 ENCOUNTER — Ambulatory Visit: Payer: BC Managed Care – PPO | Attending: Obstetrics

## 2020-10-04 ENCOUNTER — Encounter: Payer: Self-pay | Admitting: *Deleted

## 2020-10-04 VITALS — BP 109/61 | HR 75

## 2020-10-04 DIAGNOSIS — O99212 Obesity complicating pregnancy, second trimester: Secondary | ICD-10-CM | POA: Diagnosis present

## 2020-10-04 DIAGNOSIS — Z3A24 24 weeks gestation of pregnancy: Secondary | ICD-10-CM

## 2020-10-04 DIAGNOSIS — O219 Vomiting of pregnancy, unspecified: Secondary | ICD-10-CM

## 2020-10-04 DIAGNOSIS — O0990 Supervision of high risk pregnancy, unspecified, unspecified trimester: Secondary | ICD-10-CM | POA: Insufficient documentation

## 2020-10-04 DIAGNOSIS — O99282 Endocrine, nutritional and metabolic diseases complicating pregnancy, second trimester: Secondary | ICD-10-CM | POA: Diagnosis present

## 2020-10-04 DIAGNOSIS — E079 Disorder of thyroid, unspecified: Secondary | ICD-10-CM | POA: Insufficient documentation

## 2020-10-04 DIAGNOSIS — E669 Obesity, unspecified: Secondary | ICD-10-CM

## 2020-10-04 IMAGING — US US MFM OB FOLLOW-UP
1 series · 14 of 28 positions shown · non-contrast
Comparison: none

[Series 1: us mfm ob follow-up · 71 acquisitions, 14 frames shown]
[im 3/71]
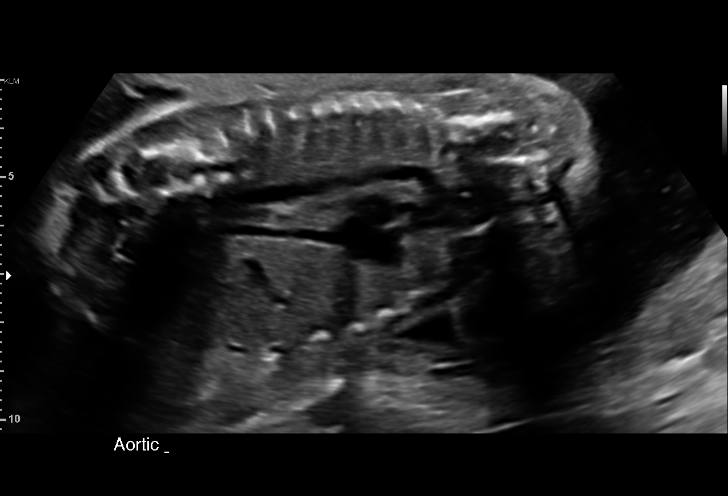
[im 8/71]
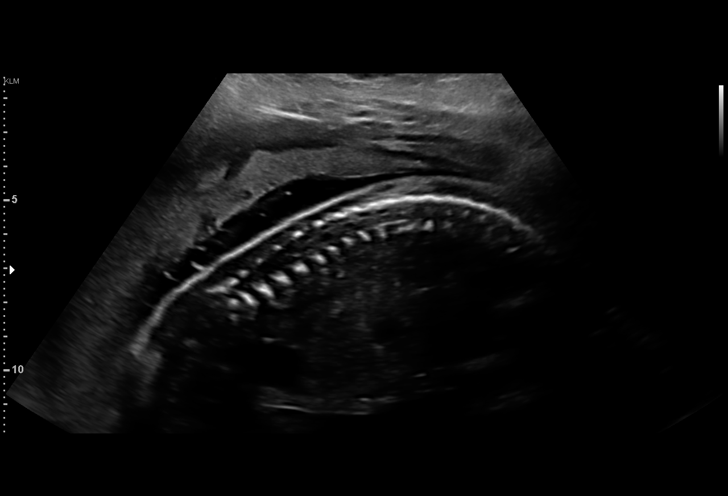
[im 13/71]
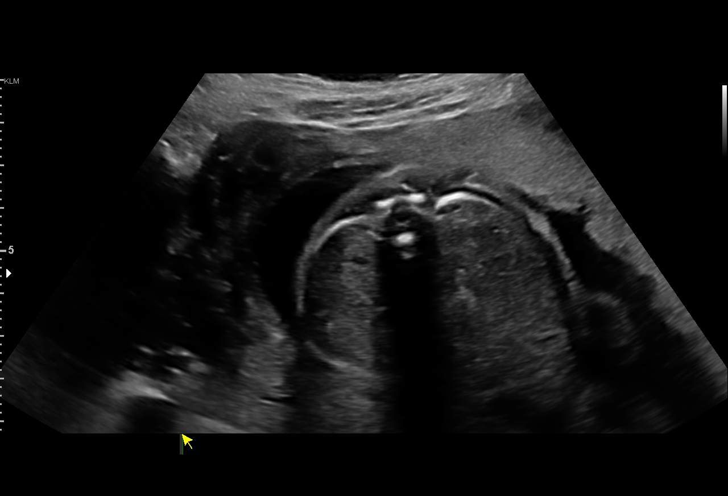
[im 19/71]
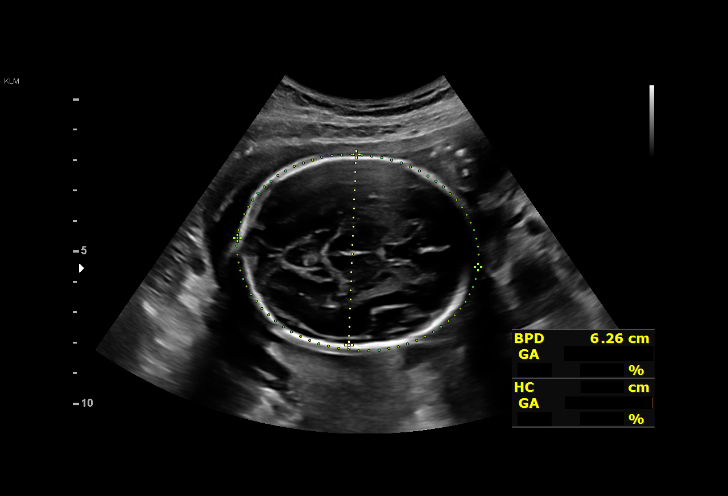
[im 24/71]
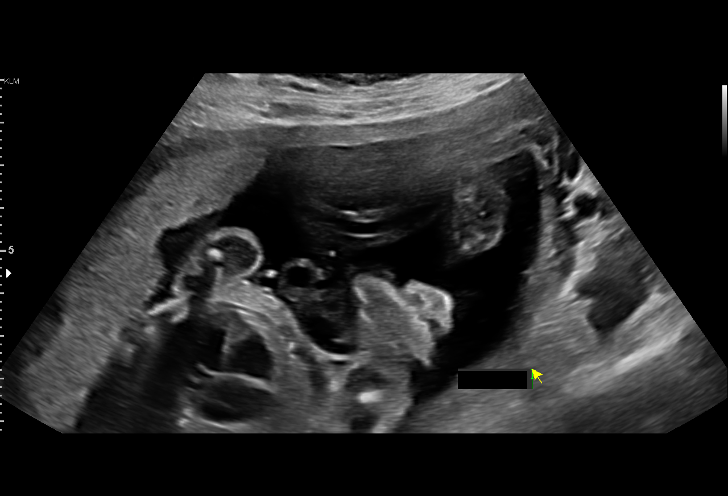
[im 29/71]
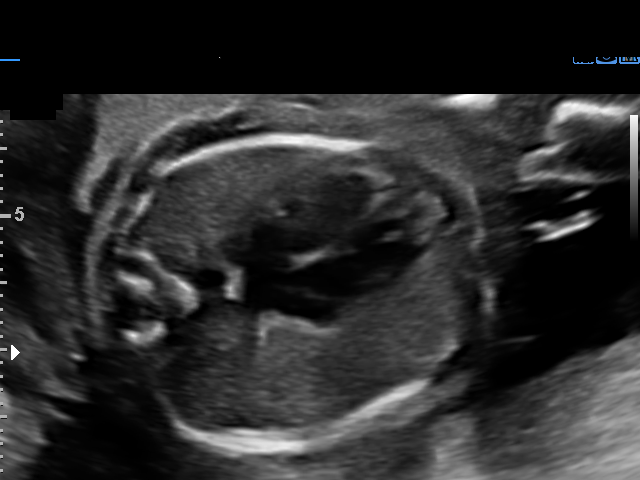
[im 34/71]
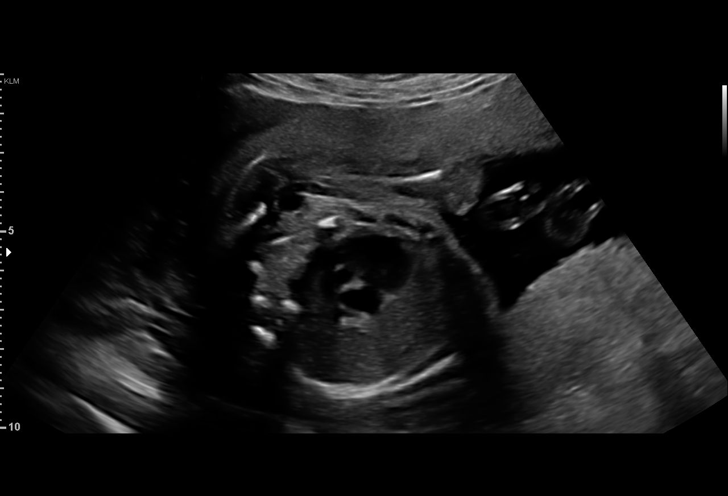
[im 39/71]
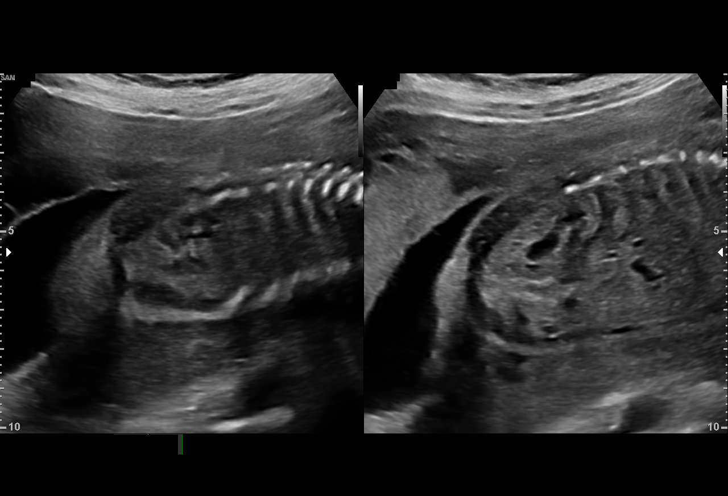
[im 45/71]
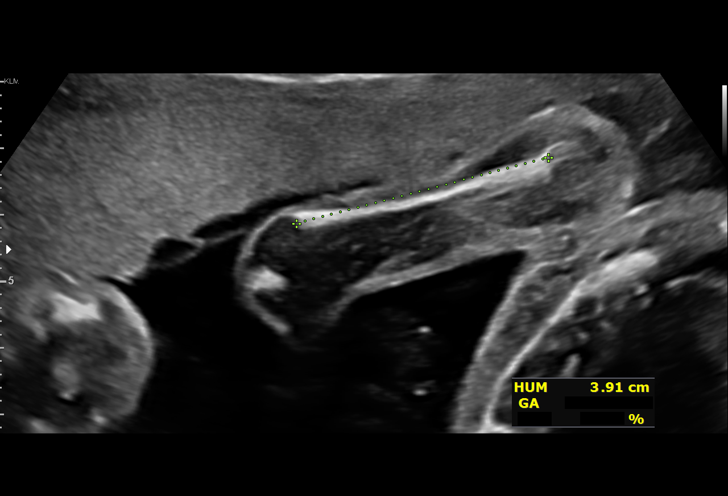
[im 50/71]
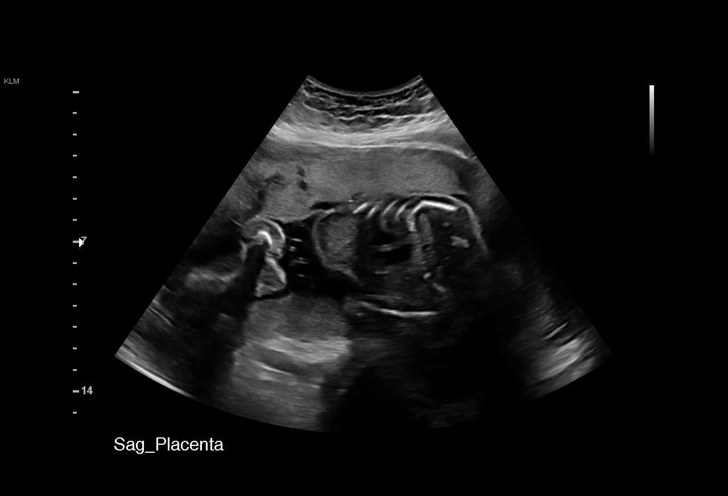
[im 55/71]
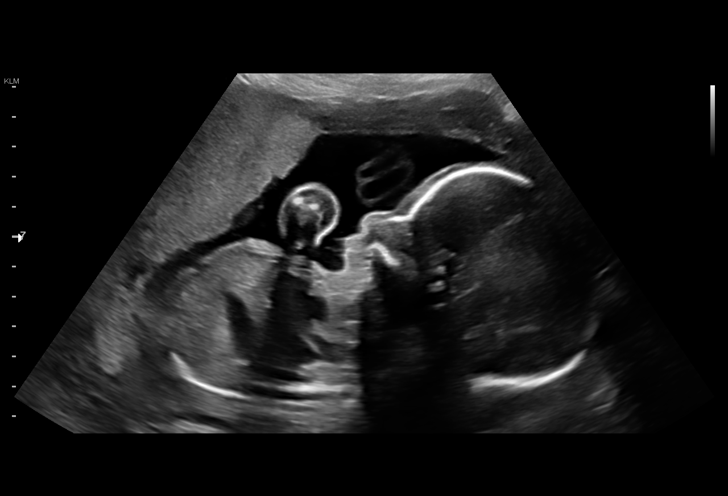
[im 60/71]
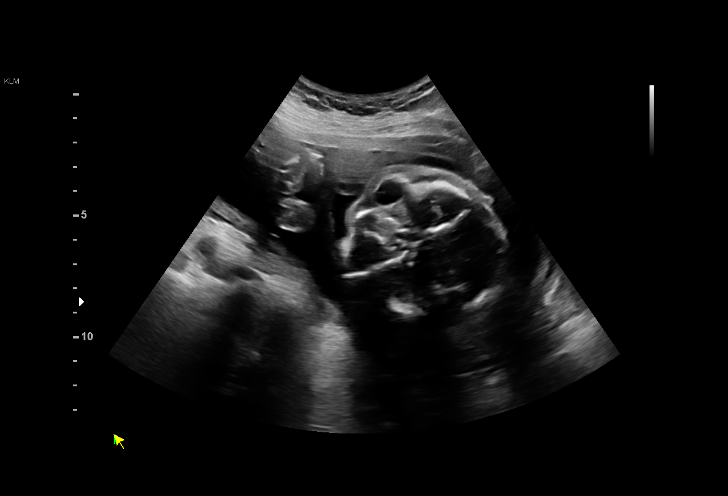
[im 65/71]
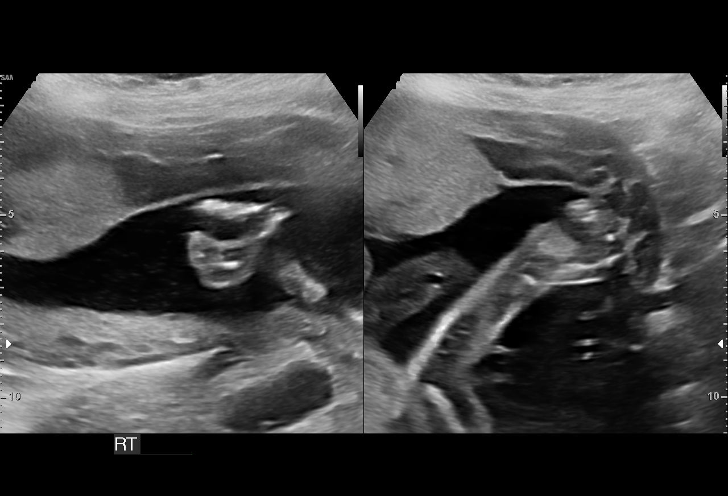
[im 71/71]
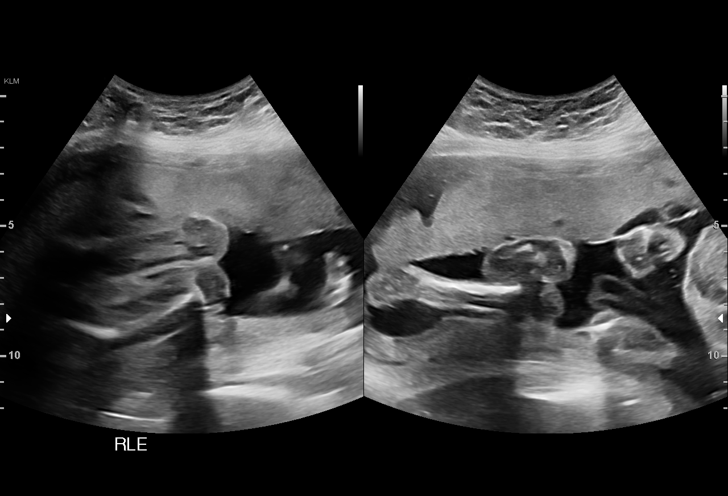

[14 of 28 positions shown; findings below may reference images not displayed]

Indications

 Thyroid disease in pregnancy                   O99.280, [KG]
 24 weeks gestation of pregnancy
 Obesity complicating pregnancy                 [KG] [KG]
 Antenatal follow-up for nonvisualized fetal    [KG]
 anatomy
Fetal Evaluation

 Num Of Fetuses:         1
 Fetal Heart Rate(bpm):  144
 Cardiac Activity:       Observed
 Presentation:           Cephalic
 Placenta:               Anterior
 P. Cord Insertion:      Previously Visualized

 Amniotic Fluid
 AFI FV:      Within normal limits

                             Largest Pocket(cm)

Biometry

 BPD:      62.1  mm     G. Age:  25w 1d         80  %    CI:        75.69   %    70 - 86
                                                         FL/HC:      19.2   %    18.7 -
 HC:      226.3  mm     G. Age:  24w 5d         51  %    HC/AC:      1.11        1.05 -
 AC:      203.9  mm     G. Age:  25w 0d         68  %    FL/BPD:     69.9   %    71 - 87
 FL:       43.4  mm     G. Age:  24w 2d         40  %    FL/AC:      21.3   %    20 - 24
 HUM:      38.7  mm     G. Age:  23w 5d         32  %
 Est. FW:     724  gm    1 lb 10 oz      67  %
OB History

 Blood Type:   A+
 Gravidity:    4         Term:   2         SAB:   1
Gestational Age

 LMP:           30w 1d        Date:  [DATE]                 EDD:   [DATE]
 U/S Today:     24w 6d                                        EDD:   [DATE]
 Best:          24w 1d     Det. By:  Early Ultrasound         EDD:   [DATE]
                                     ([DATE])
Anatomy

 Cranium:               Appears normal         LVOT:                   Appears normal
 Cavum:                 Appears normal         Aortic Arch:            Appears normal
 Ventricles:            Appears normal         Ductal Arch:            Previously seen
 Choroid Plexus:        Appears normal         Diaphragm:              Appears normal
 Cerebellum:            Appears normal         Stomach:                Appears normal, left
                                                                       sided
 Posterior Fossa:       Appears normal         Abdomen:                Appears normal
 Nuchal Fold:           Previously seen        Abdominal Wall:         Previously seen
 Face:                  Appears normal         Cord Vessels:           Previously seen
                        (orbits and profile)
 Lips:                  Appears normal         Kidneys:                Appear normal
 Palate:                Limited Views          Bladder:                Appears normal
                        previously seen
 Thoracic:              Appears normal         Spine:                  Appears normal
 Heart:                 Appears normal         Upper Extremities:      Appears normal
                        (4CH, axis, and
                        situs)
 RVOT:                  Appears normal         Lower Extremities:      Appears normal

 Other:  VC previously seen.  3VV and 3VTV visualized. Normal genaitalia.
         Hands and feet visualized. Heels visualized.
Cervix Uterus Adnexa

 Cervix
 Not visualized (advanced GA >[KG])

 Adnexa
 No abnormality visualized.
Impression

 Patient returned for completion of fetal anatomy .Amniotic
 fluid is normal and good fetal activity is seen .Fetal biometry
 is consistent with her previously-established dates .Fetal
 anatomical survey was completed and appears normal.
Recommendations

 Follow-up scans as clinically indicated.
                 EBADAT

## 2020-10-06 ENCOUNTER — Ambulatory Visit (INDEPENDENT_AMBULATORY_CARE_PROVIDER_SITE_OTHER): Payer: BC Managed Care – PPO | Admitting: Obstetrics

## 2020-10-06 ENCOUNTER — Other Ambulatory Visit: Payer: Self-pay

## 2020-10-06 VITALS — BP 128/78 | HR 83 | Wt 167.0 lb

## 2020-10-06 DIAGNOSIS — O0991 Supervision of high risk pregnancy, unspecified, first trimester: Secondary | ICD-10-CM

## 2020-10-06 DIAGNOSIS — Z3A24 24 weeks gestation of pregnancy: Secondary | ICD-10-CM

## 2020-10-06 DIAGNOSIS — R82998 Other abnormal findings in urine: Secondary | ICD-10-CM

## 2020-10-06 DIAGNOSIS — O0992 Supervision of high risk pregnancy, unspecified, second trimester: Secondary | ICD-10-CM

## 2020-10-06 NOTE — Progress Notes (Signed)
Routine Prenatal Care Visit  Subjective  Kari Frost is a 35 y.o. [id number] at [redacted]w[redacted]d being seen today for ongoing prenatal care.  She is currently monitored for the following issues for this high-risk pregnancy and has Thyroid enlargement; Displacement of intrauterine contraceptive device; Current severe episode of major depressive disorder without psychotic features without prior episode (HCC); Class 1 obesity due to excess calories without serious comorbidity with body mass index (BMI) of 30.0 to 30.9 in adult; Pure hypercholesterolemia; Mood disorder (HCC); Genital herpes simplex type 2; Supervision of high risk pregnancy, antepartum; Obesity affecting pregnancy; Herpes virus infection during pregnancy; and Nausea and vomiting during pregnancy on their problem list.  ----------------------------------------------------------------------------------- Patient reports no complaints.  She is naming her son Kari Frost Contractions: Not present. Vag. Bleeding: None.  Movement: Present. Leaking Fluid denies.  ----------------------------------------------------------------------------------- The following portions of the patient's history were reviewed and updated as appropriate: allergies, current medications, past family history, past medical history, past social history, past surgical history and problem list. Problem list updated.  Objective  Blood pressure 128/78, pulse 83, weight 167 lb (75.8 kg), last menstrual period 03/07/2020, unknown if currently breastfeeding. Pregravid weight 190 lb (86.2 kg) Total Weight Gain -23 lb (-10.4 kg) Urinalysis: Urine Protein    Urine Glucose    Fetal Status:     Movement: Present     General:  Alert, oriented and cooperative. Patient is in no acute distress.  Skin: Skin is warm and dry. No rash noted.   Cardiovascular: Normal heart rate noted  Respiratory: Normal respiratory effort, no problems with respiration noted  Abdomen: Soft, gravid, appropriate for  gestational age. Pain/Pressure: Absent     Pelvic:  Cervical exam deferred        Extremities: Normal range of motion.     Mental Status: Normal mood and affect. Normal behavior. Normal judgment and thought content.   Assessment   35 y.o. [id number] at [redacted]w[redacted]d by  01/23/2021, by Ultrasound presenting for routine prenatal visit  Plan   MAY 2022 Problems (from 06/13/20 to present)    Problem Noted Resolved   Nausea and vomiting during pregnancy 06/15/2020 by Conard Novak, MD No   Supervision of high risk pregnancy, antepartum 06/13/2020 by Conard Novak, MD No   Overview Signed 08/28/2020 11:53 AM by Vena Austria, MD     Nursing Staff Provider  Office Location  [facility] Dating  9 week [place]  Language  English Anatomy US    Flu Vaccine   Genetic Screen  NIPS:   TDaP vaccine    Hgb A1C or  GTT HgbA1C:4.9   Third trimester :   Covid    LAB RESULTS   Rhogam   Blood Type     Feeding Plan  Antibody    Contraception  Rubella    Circumcision  RPR     Pediatrician   HBsAg     Support Person  HIV    Prenatal Classes  Varicella     GBS  (For PCN allergy, check sensitivities)   BTL Consent     VBAC Consent  Pap      Hgb Electro      CF      SMA               Obesity affecting pregnancy 06/13/2020 by Conard Novak, MD No   Herpes virus infection during pregnancy 06/13/2020 by Conard Novak, MD No   Overview Signed 06/13/2020  3:40 PM by Conard Novak, MD    [ ]   ppx at 36 wks          Preterm labor symptoms and general obstetric precautions including but not limited to vaginal bleeding, contractions, leaking of fluid and fetal movement were reviewed in detail with the patient. Please refer to After Visit Summary for other counseling recommendations.   Return in about 2 weeks (around 10/20/2020) for return OB, and new OB labs ( never done).  Mirna Mires, CNM  10/06/2020 5:26 PM

## 2020-10-18 ENCOUNTER — Ambulatory Visit (INDEPENDENT_AMBULATORY_CARE_PROVIDER_SITE_OTHER): Payer: BC Managed Care – PPO | Admitting: Advanced Practice Midwife

## 2020-10-18 ENCOUNTER — Other Ambulatory Visit: Payer: BC Managed Care – PPO

## 2020-10-18 ENCOUNTER — Other Ambulatory Visit: Payer: Self-pay

## 2020-10-18 ENCOUNTER — Encounter: Payer: Self-pay | Admitting: Advanced Practice Midwife

## 2020-10-18 VITALS — BP 118/76 | Wt 169.0 lb

## 2020-10-18 DIAGNOSIS — Z3A26 26 weeks gestation of pregnancy: Secondary | ICD-10-CM

## 2020-10-18 DIAGNOSIS — O0992 Supervision of high risk pregnancy, unspecified, second trimester: Secondary | ICD-10-CM

## 2020-10-18 DIAGNOSIS — O99212 Obesity complicating pregnancy, second trimester: Secondary | ICD-10-CM

## 2020-10-18 DIAGNOSIS — O0991 Supervision of high risk pregnancy, unspecified, first trimester: Secondary | ICD-10-CM

## 2020-10-18 LAB — POCT URINALYSIS DIPSTICK OB: Glucose, UA: NEGATIVE

## 2020-10-18 NOTE — Progress Notes (Signed)
ROB - 1 hr gtt, no concerns. RM 6 

## 2020-10-18 NOTE — Progress Notes (Signed)
Routine Prenatal Care Visit  Subjective  Kari Frost is a 35 y.o. [id number] at 104w1d being seen today for ongoing prenatal care.  She is currently monitored for the following issues for this high-risk pregnancy and has Thyroid enlargement; Displacement of intrauterine contraceptive device; Current severe episode of major depressive disorder without psychotic features without prior episode (HCC); Class 1 obesity due to excess calories without serious comorbidity with body mass index (BMI) of 30.0 to 30.9 in adult; Pure hypercholesterolemia; Mood disorder (HCC); Genital herpes simplex type 2; Supervision of high risk pregnancy, antepartum; Obesity affecting pregnancy; Herpes virus infection during pregnancy; and Nausea and vomiting during pregnancy on their problem list.  ----------------------------------------------------------------------------------- Patient reports shortness of breath. She denies asthma or s/s of URI. Discussed normal pregnancy discomfort. Contractions: Not present. Vag. Bleeding: None.  Movement: Present. Leaking Fluid denies.  ----------------------------------------------------------------------------------- The following portions of the patient's history were reviewed and updated as appropriate: allergies, current medications, past family history, past medical history, past social history, past surgical history and problem list. Problem list updated.  Objective  Blood pressure 118/76, weight 169 lb (76.7 kg), last menstrual period 03/07/2020 Pregravid weight 190 lb (86.2 kg) Total Weight Gain -21 lb (-9.526 kg) Urinalysis: Urine Protein Small (1+)  Urine Glucose Negative  Fetal Status: Fetal Heart Rate (bpm): 145 Fundal Height: 27 cm Movement: Present     General:  Alert, oriented and cooperative. Patient is in no acute distress.  Skin: Skin is warm and dry. No rash noted.   Cardiovascular: Normal heart rate noted  Respiratory: Normal respiratory effort, no problems  with respiration noted  Abdomen: Soft, gravid, appropriate for gestational age. Pain/Pressure: Absent     Pelvic:  Cervical exam deferred        Extremities: Normal range of motion.  Edema: None  Mental Status: Normal mood and affect. Normal behavior. Normal judgment and thought content.   Assessment   35 y.o. [id number] at [redacted]w[redacted]d by  01/23/2021, by Ultrasound presenting for routine prenatal visit  Plan   MAY 2022 Problems (from 06/13/20 to present)    Problem Noted Resolved   Nausea and vomiting during pregnancy 06/15/2020 by Conard Novak, MD No   Supervision of high risk pregnancy, antepartum 06/13/2020 by Conard Novak, MD No   Overview Signed 08/28/2020 11:53 AM by Vena Austria, MD     Nursing Staff Provider  Office Location  [facility] Dating  9 week [place]  Language  English Anatomy US    Flu Vaccine   Genetic Screen  NIPS:   TDaP vaccine    Hgb A1C or  GTT HgbA1C:4.9   Third trimester :   Covid    LAB RESULTS   Rhogam   Blood Type     Feeding Plan  Antibody    Contraception  Rubella    Circumcision  RPR     Pediatrician   HBsAg     Support Person  HIV    Prenatal Classes  Varicella     GBS  (For PCN allergy, check sensitivities)   BTL Consent     VBAC Consent  Pap      Hgb Electro      CF      SMA               Obesity affecting pregnancy 06/13/2020 by Conard Novak, MD No   Herpes virus infection during pregnancy 06/13/2020 by Conard Novak, MD No   Overview Signed 06/13/2020  3:40 PM by Jean Rosenthal,  Mila Homer, MD    [ ]  ppx at 36 wks          Preterm labor symptoms and general obstetric precautions including but not limited to vaginal bleeding, contractions, leaking of fluid and fetal movement were reviewed in detail with the patient.   Return in about 2 weeks (around 11/01/2020) for rob.  01/01/2021, CNM 10/18/2020 3:53 PM

## 2020-10-20 LAB — 28 WEEK RH+PANEL
Basophils Absolute: 0 10*3/uL (ref 0.0–0.2)
Basos: 0 %
EOS (ABSOLUTE): 0.1 10*3/uL (ref 0.0–0.4)
Eos: 1 %
Gestational Diabetes Screen: 78 mg/dL (ref 65–139)
HIV Screen 4th Generation wRfx: NONREACTIVE
Hematocrit: 32.2 % — ABNORMAL LOW (ref 34.0–46.6)
Hemoglobin: 10.8 g/dL — ABNORMAL LOW (ref 11.1–15.9)
Immature Grans (Abs): 0 10*3/uL (ref 0.0–0.1)
Immature Granulocytes: 1 %
Lymphocytes Absolute: 1.3 10*3/uL (ref 0.7–3.1)
Lymphs: 19 %
MCH: 31.4 pg (ref 26.6–33.0)
MCHC: 33.5 g/dL (ref 31.5–35.7)
MCV: 94 fL (ref 79–97)
Monocytes Absolute: 0.6 10*3/uL (ref 0.1–0.9)
Monocytes: 9 %
Neutrophils Absolute: 4.9 10*3/uL (ref 1.4–7.0)
Neutrophils: 70 %
Platelets: 215 10*3/uL (ref 150–450)
RBC: 3.44 x10E6/uL — ABNORMAL LOW (ref 3.77–5.28)
RDW: 13.3 % (ref 11.7–15.4)
RPR Ser Ql: NONREACTIVE
WBC: 6.9 10*3/uL (ref 3.4–10.8)

## 2020-10-20 LAB — RPR+RH+ABO+RUB AB+AB SCR+CB...
Antibody Screen: NEGATIVE
Hepatitis B Surface Ag: NEGATIVE
Rh Factor: POSITIVE
Rubella Antibodies, IGG: 1.05 index (ref >0.99)
Varicella zoster IgG: 1164 index (ref >165)

## 2020-10-20 LAB — HEPATITIS C ANTIBODY: Hep C Virus Ab: <0.1 s/co ratio (ref 0.0–0.9)

## 2020-11-01 ENCOUNTER — Other Ambulatory Visit: Payer: Self-pay

## 2020-11-01 ENCOUNTER — Ambulatory Visit (INDEPENDENT_AMBULATORY_CARE_PROVIDER_SITE_OTHER): Payer: BC Managed Care – PPO | Admitting: Obstetrics

## 2020-11-01 VITALS — BP 100/60 | Wt 172.0 lb

## 2020-11-01 DIAGNOSIS — O0990 Supervision of high risk pregnancy, unspecified, unspecified trimester: Secondary | ICD-10-CM

## 2020-11-01 DIAGNOSIS — O0992 Supervision of high risk pregnancy, unspecified, second trimester: Secondary | ICD-10-CM

## 2020-11-01 DIAGNOSIS — Z3A28 28 weeks gestation of pregnancy: Secondary | ICD-10-CM

## 2020-11-01 LAB — POCT URINALYSIS DIPSTICK OB
Glucose, UA: NEGATIVE
POC,PROTEIN,UA: NEGATIVE

## 2020-11-01 NOTE — Addendum Note (Signed)
Addended by: Donnetta Hail on: 11/01/2020 04:31 PM   Modules accepted: Orders

## 2020-11-01 NOTE — Progress Notes (Signed)
ROB- no concerns 

## 2020-11-01 NOTE — Progress Notes (Signed)
Routine Prenatal Care Visit  Subjective  Kari Frost is a 35 y.o. [id number] at [redacted]w[redacted]d being seen today for ongoing prenatal care.  She is currently monitored for the following issues for this high-risk pregnancy and has Thyroid enlargement; Displacement of intrauterine contraceptive device; Current severe episode of major depressive disorder without psychotic features without prior episode (HCC); Class 1 obesity due to excess calories without serious comorbidity with body mass index (BMI) of 30.0 to 30.9 in adult; Pure hypercholesterolemia; Mood disorder (HCC); Genital herpes simplex type 2; Supervision of high risk pregnancy, antepartum; Obesity affecting pregnancy; Herpes virus infection during pregnancy; and Nausea and vomiting during pregnancy on their problem list.  ----------------------------------------------------------------------------------- Patient reports no complaints.  She had her 28 week labs last visit, and shares that her H and H is borderline (10.0 and 32). She is not taking her prenatal vitamins.  .  .   [name] denies.  ----------------------------------------------------------------------------------- The following portions of the patient's history were reviewed and updated as appropriate: allergies, current medications, past family history, past medical history, past social history, past surgical history and problem list. Problem list updated.  Objective  Blood pressure 100/60, weight 172 lb (78 kg), last menstrual period 03/07/2020, unknown if currently breastfeeding. Pregravid weight 190 lb (86.2 kg) Total Weight Gain -18 lb (-8.165 kg) Urinalysis: Urine Protein    Urine Glucose    Fetal Status:           General:  Alert, oriented and cooperative. Patient is in no acute distress.  Skin: Skin is warm and dry. No rash noted.   Cardiovascular: Normal heart rate noted  Respiratory: Normal respiratory effort, no problems with respiration noted  Abdomen: Soft,  gravid, appropriate for gestational age.       Pelvic:  Cervical exam deferred        Extremities: Normal range of motion.     Mental Status: Normal mood and affect. Normal behavior. Normal judgment and thought content.   Assessment   35 y.o. [id number] at [redacted]w[redacted]d by  01/23/2021, by Ultrasound presenting for routine prenatal visit  Plan   MAY 2022 Problems (from 06/13/20 to present)    Problem Noted Resolved   Nausea and vomiting during pregnancy 06/15/2020 by Conard Novak, MD No   Supervision of high risk pregnancy, antepartum 06/13/2020 by Conard Novak, MD No   Overview Addendum 11/01/2020  4:11 PM by Mirna Mires, CNM     Nursing Staff Provider  Office Location  [facility] Dating  9 week [place]  Language  English Anatomy US  Normal female  Flu Vaccine   Genetic Screen  NIPS: declined  TDaP vaccine    Hgb A1C or  GTT HgbA1C:4.9   Third trimester : 30  Covid    LAB RESULTS   Rhogam   Blood Type   A+  Feeding Plan  Antibody  neg  Contraception  Rubella  immune  Circumcision  RPR   neg  Pediatrician   HBsAg   neg  Support Person  HIV  neg  Prenatal Classes  Varicella immune    GBS  (For PCN allergy, check sensitivities)   BTL Consent     VBAC Consent  Pap      Hgb Electro      CF      SMA               Obesity affecting pregnancy 06/13/2020 by Conard Novak, MD No   Herpes virus infection during pregnancy  06/13/2020 by Conard Novak, MD No   Overview Signed 06/13/2020  3:40 PM by Conard Novak, MD    [ ]  ppx at 36 wks          Preterm labor symptoms and general obstetric precautions including but not limited to vaginal bleeding, contractions, leaking of fluid and fetal movement were reviewed in detail with the patient. Please refer to After Visit Summary for other counseling recommendations.  She will start an iron supplement and start on the PNVs.  Return in about 2 weeks (around 11/15/2020) for return OB.  11/17/2020, CNM  11/01/2020 4:25  PM

## 2020-11-07 ENCOUNTER — Telehealth: Payer: Self-pay

## 2020-11-07 NOTE — Telephone Encounter (Signed)
Pt calling; is 29wks; has mild pelvic pressure on right side; has also had some diarrhea today.  [id number]  Pt hasn't had much of an appetite today; has been drinking water; no vag bleeding or leaking fluid; B-H ctxs; no fever.  Adv may be trying to get an stomach bug; baby's position may be causing pressure on one side.  Adv to take Immodium, Kaopectate, or donnogel; sip clear liquids for 24hrs, then bland food for 24hrs, then work up to reg diet as tolerated.  Pt aware also to be seen if unable to keep fluids down for 24hrs.

## 2020-11-08 ENCOUNTER — Emergency Department: Payer: BC Managed Care – PPO

## 2020-11-08 ENCOUNTER — Other Ambulatory Visit: Payer: Self-pay

## 2020-11-08 ENCOUNTER — Emergency Department
Admission: EM | Admit: 2020-11-08 | Discharge: 2020-11-08 | Disposition: A | Discharge status: Home / Self Care | Payer: BC Managed Care – PPO | Attending: Emergency Medicine | Admitting: Emergency Medicine

## 2020-11-08 DIAGNOSIS — E860 Dehydration: Secondary | ICD-10-CM | POA: Insufficient documentation

## 2020-11-08 DIAGNOSIS — R1031 Right lower quadrant pain: Secondary | ICD-10-CM | POA: Diagnosis not present

## 2020-11-08 DIAGNOSIS — O219 Vomiting of pregnancy, unspecified: Secondary | ICD-10-CM | POA: Diagnosis not present

## 2020-11-08 DIAGNOSIS — Z20822 Contact with and (suspected) exposure to covid-19: Secondary | ICD-10-CM | POA: Insufficient documentation

## 2020-11-08 DIAGNOSIS — R112 Nausea with vomiting, unspecified: Secondary | ICD-10-CM

## 2020-11-08 DIAGNOSIS — Z3A29 29 weeks gestation of pregnancy: Secondary | ICD-10-CM | POA: Diagnosis not present

## 2020-11-08 LAB — URINALYSIS, COMPLETE (UACMP) WITH MICROSCOPIC
Bilirubin Urine: NEGATIVE
Glucose, UA: NEGATIVE mg/dL
Hgb urine dipstick: NEGATIVE
Ketones, ur: 80 mg/dL — AB
Nitrite: NEGATIVE
Protein, ur: 30 mg/dL — AB
Specific Gravity, Urine: 1.031 — ABNORMAL HIGH (ref 1.005–1.030)
pH: 5 (ref 5.0–8.0)

## 2020-11-08 LAB — RESP PANEL BY RT-PCR (FLU A&B, COVID) ARPGX2
Influenza A by PCR: NEGATIVE
Influenza B by PCR: NEGATIVE
SARS Coronavirus 2 by RT PCR: NEGATIVE

## 2020-11-08 LAB — CBC
HCT: 32 % — ABNORMAL LOW (ref 36.0–46.0)
Hemoglobin: 11.2 g/dL — ABNORMAL LOW (ref 12.0–15.0)
MCH: 32.7 pg (ref 26.0–34.0)
MCHC: 35 g/dL (ref 30.0–36.0)
MCV: 93.6 fL (ref 80.0–100.0)
Platelets: 194 10*3/uL (ref 150–400)
RBC: 3.42 MIL/uL — ABNORMAL LOW (ref 3.87–5.11)
RDW: 13.4 % (ref 11.5–15.5)
WBC: 6.5 10*3/uL (ref 4.0–10.5)
nRBC: 0 % (ref 0.0–0.2)

## 2020-11-08 LAB — COMPREHENSIVE METABOLIC PANEL
ALT: 10 U/L (ref 0–44)
AST: 13 U/L — ABNORMAL LOW (ref 15–41)
Albumin: 3.4 g/dL — ABNORMAL LOW (ref 3.5–5.0)
Alkaline Phosphatase: 69 U/L (ref 38–126)
Anion gap: 8 (ref 5–15)
BUN: 7 mg/dL (ref 6–20)
CO2: 22 mmol/L (ref 22–32)
Calcium: 8.6 mg/dL — ABNORMAL LOW (ref 8.9–10.3)
Chloride: 104 mmol/L (ref 98–111)
Creatinine, Ser: 0.42 mg/dL — ABNORMAL LOW (ref 0.44–1.00)
GFR, Estimated: >60 mL/min (ref >60)
Glucose, Bld: 83 mg/dL (ref 70–99)
Potassium: 3.8 mmol/L (ref 3.5–5.1)
Sodium: 134 mmol/L — ABNORMAL LOW (ref 135–145)
Total Bilirubin: 0.8 mg/dL (ref 0.3–1.2)
Total Protein: 6.4 g/dL — ABNORMAL LOW (ref 6.5–8.1)

## 2020-11-08 LAB — LIPASE, BLOOD: Lipase: 30 U/L (ref 11–51)

## 2020-11-08 IMAGING — MR MR ABDOMEN W/O CM
6 of 8 series · 30 of 48 positions shown · non-contrast
Comparison: Obstetrical ultrasound [DATE]

CLINICAL DATA: Right lower quadrant abdominal pain with diarrhea
and vomiting since yesterday. Third trimester pregnancy. Clinical
concern for appendicitis.

EXAM:
MRI ABDOMEN AND PELVIS WITHOUT CONTRAST
TECHNIQUE: Multiplanar multisequence MR imaging of the abdomen and pelvis was
performed. No intravenous contrast was administered.

[Series 4: cor haste · coronal · 5.0mm · 1.25mm/px · 4 of 44 slices shown]
[im 1/44]
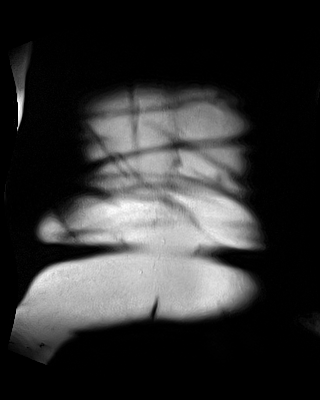
[im 15/44]
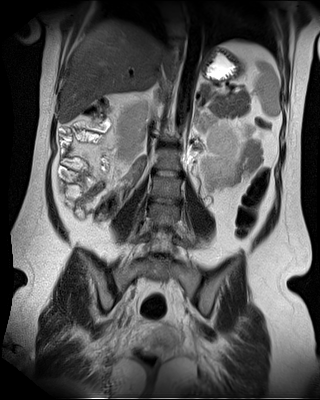
[im 29/44]
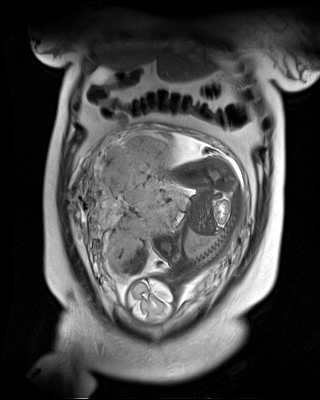
[im 44/44]
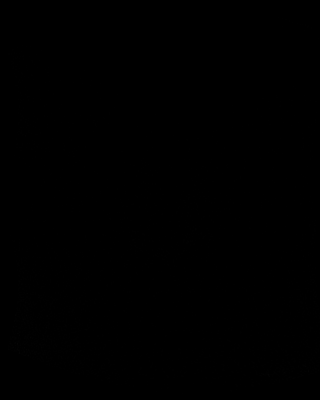

[Series 5: cor haste fs · coronal · 5.0mm · 1.25mm/px · 4 of 44 slices shown]
[im 1/44]
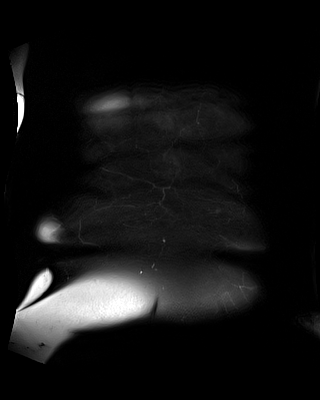
[im 15/44]
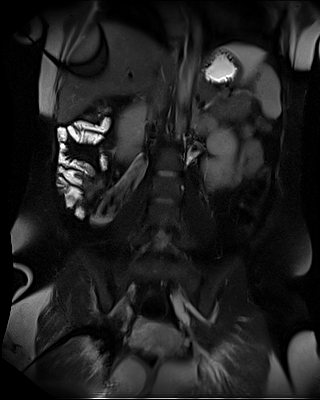
[im 29/44]
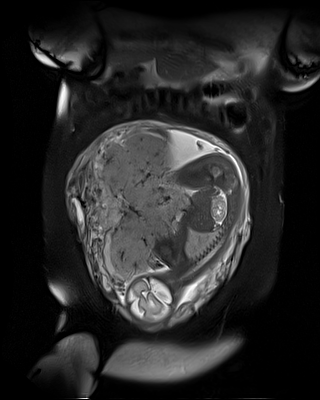
[im 44/44]
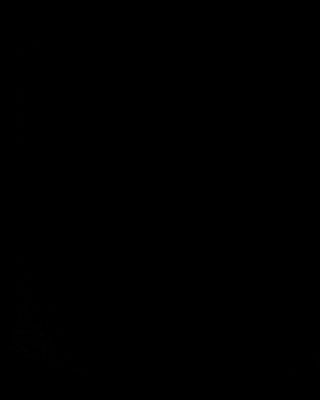

[Series 6: bSSFP · coronal · 5.0mm · 0.78mm/px · 4 of 44 slices shown (1 of 2)]
[im 1/44]
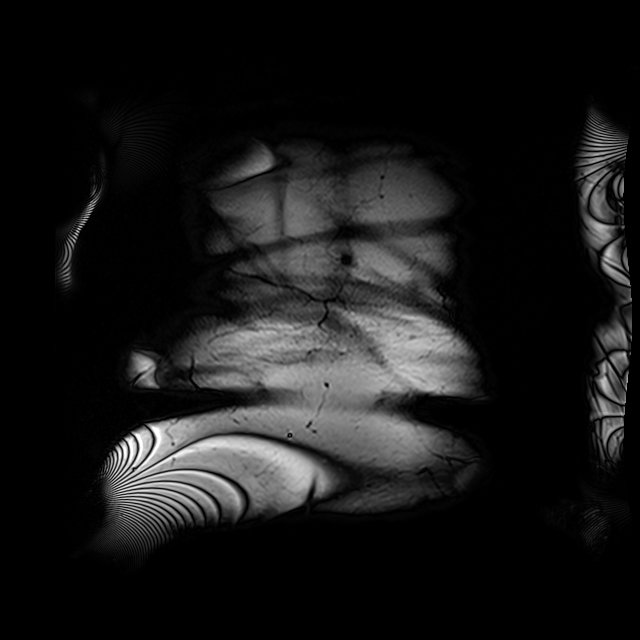
[im 15/44]
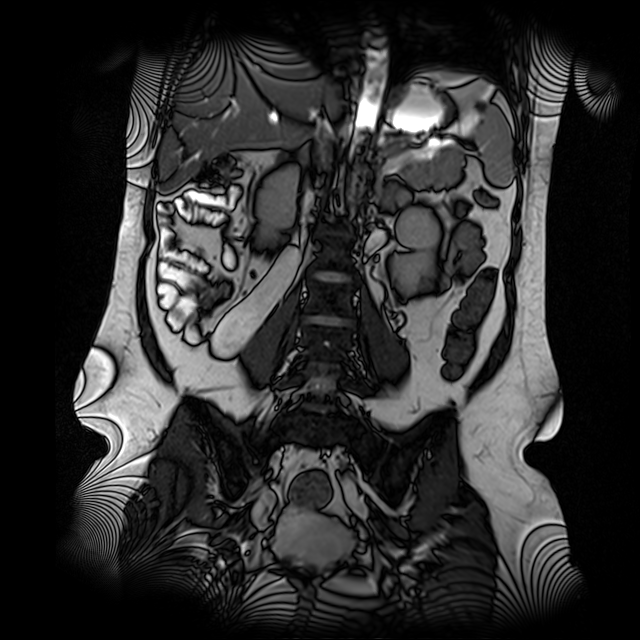
[im 29/44]
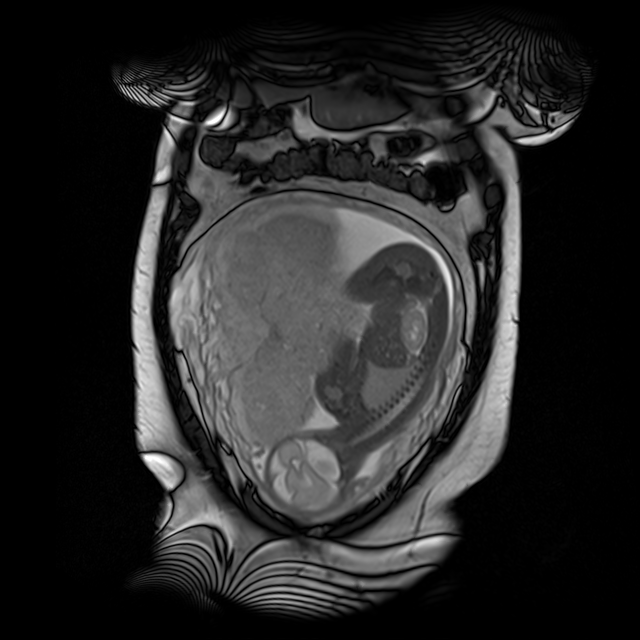
[im 44/44]
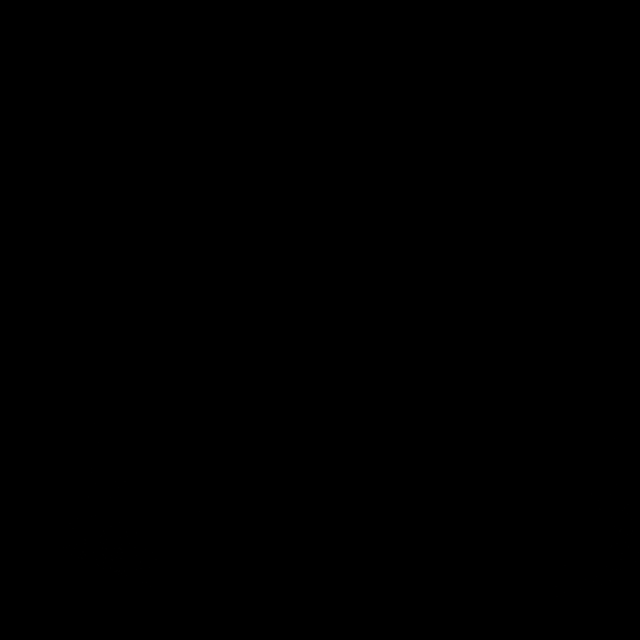

[Series 9: T2 · axial · 4.0mm · 1.19mm/px · z∈[-210,+313]mm · 8 of 110 slices shown (1 of 2)]
[im 1/110]
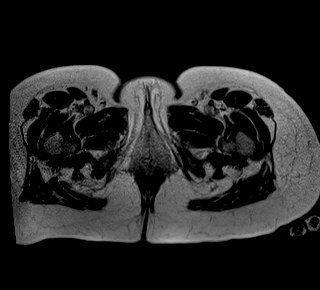
[im 16/110]
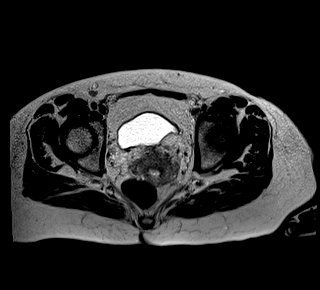
[im 32/110]
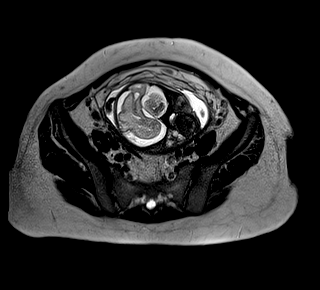
[im 47/110]
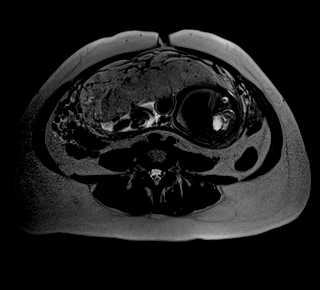
[im 63/110]
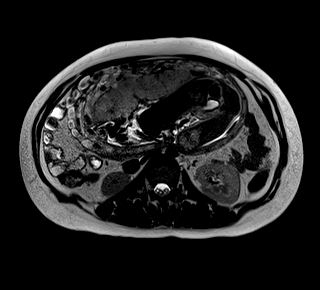
[im 78/110]
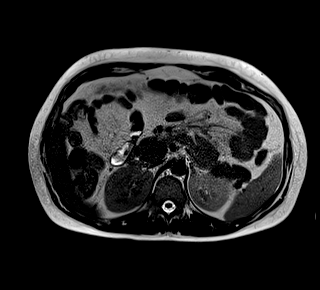
[im 94/110]
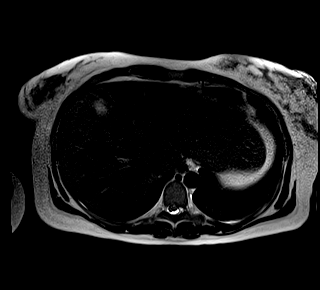
[im 110/110]
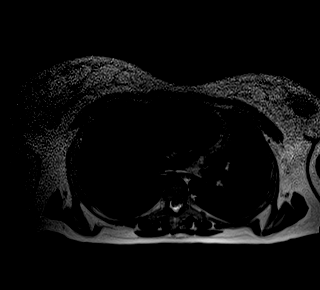

[Series 12: T2 · axial · 4.0mm · 1.19mm/px · z∈[-210,+313]mm · 8 of 110 slices shown (2 of 2)]
[im 1/110]
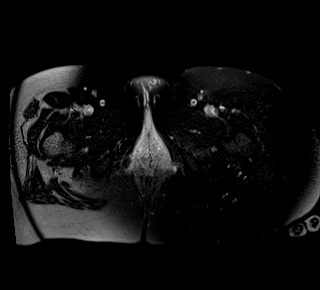
[im 16/110]
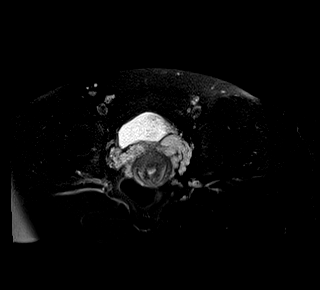
[im 32/110]
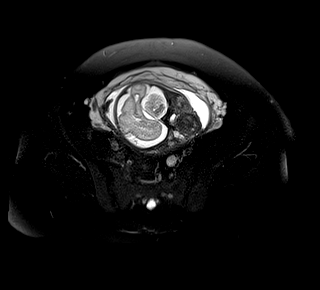
[im 47/110]
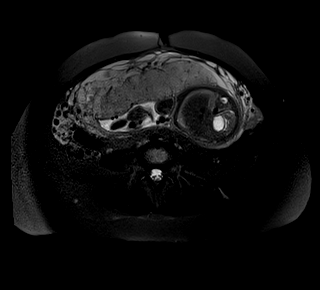
[im 63/110]
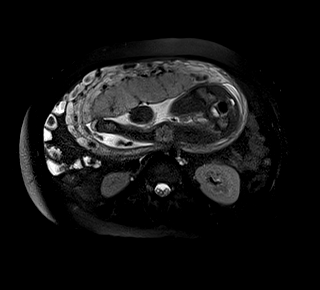
[im 78/110]
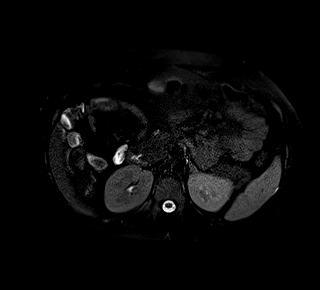
[im 94/110]
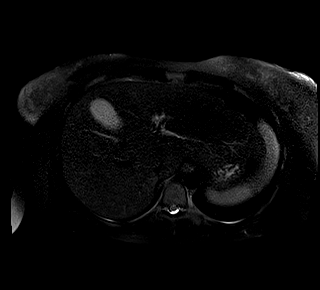
[im 110/110]
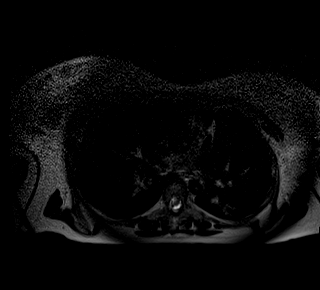

[Series 15: bSSFP · axial · 4.0mm · 0.59mm/px · z∈[-210,-138]mm · 2 of 110 slices shown (2 of 2)]
[im 1/110]
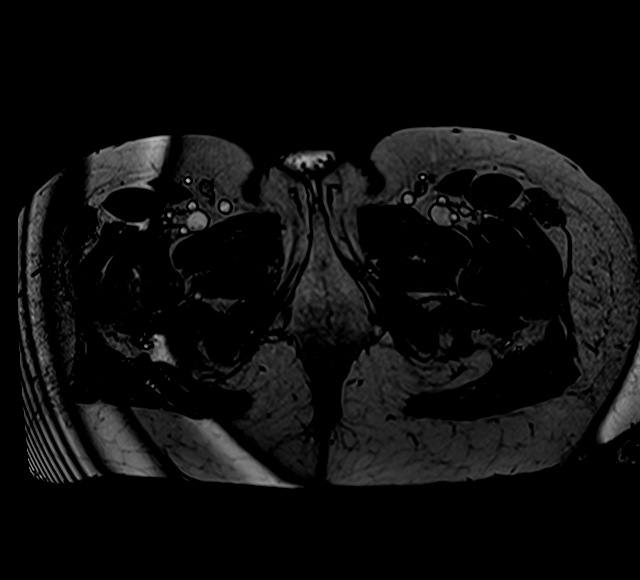
[im 16/110]
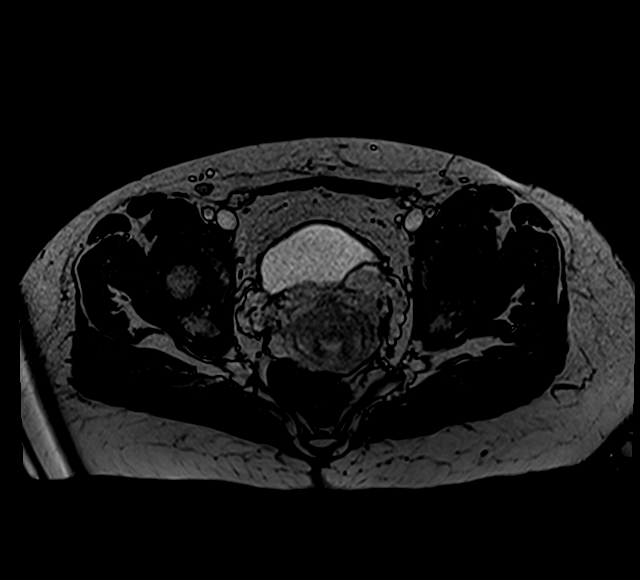

[30 of 48 positions shown; findings below may reference images not displayed]

FINDINGS: COMBINED FINDINGS FOR BOTH MR ABDOMEN AND PELVIS

Lower chest:  The visualized lower chest appears unremarkable.

Hepatobiliary: The liver is normal in signal, without focal
abnormality. The gallbladder is mildly distended, without evidence
of gallstones, gallbladder wall thickening or surrounding
inflammation. There is no biliary dilatation.

Pancreas: Unremarkable. No pancreatic ductal dilatation or
surrounding inflammatory changes.

Spleen: Normal in size without focal abnormality.

Adrenals/Urinary Tract: Both adrenal glands appear normal. Both
kidneys appear normal, without hydronephrosis. The bladder appears
normal.

Stomach/Bowel: The cecum is superiorly displaced from the pelvis by
the gravid uterus. The appendix appears normal. There is no evidence
of bowel wall thickening, distention or surrounding inflammation.
The stomach appears normal for its degree of distention.

Vascular/Lymphatic: There are no enlarged abdominal or pelvic lymph
nodes. No significant vascular findings on noncontrast imaging.

Reproductive: Gravid uterus noted. Fetus is in cephalic presentation
with the placenta anteriorly on the right. The vagina appears
unremarkable. No adnexal mass.

Other: No evidence of abdominal wall mass or hernia. No ascites.

Musculoskeletal: No acute or significant osseous findings.
IMPRESSION: 1. No acute findings or explanation for the patient's symptoms. The
appendix appears normal.
2. The gallbladder is mildly distended without focal abnormality,
within physiologic limits. No hydronephrosis.
3. Gravid uterus without demonstrated complication.

## 2020-11-08 IMAGING — MR MR PELVIS W/O CM
6 of 13 series · 27 of 48 positions shown · non-contrast
Comparison: Obstetrical ultrasound [DATE]

CLINICAL DATA: Right lower quadrant abdominal pain with diarrhea
and vomiting since yesterday. Third trimester pregnancy. Clinical
concern for appendicitis.

EXAM:
MRI ABDOMEN AND PELVIS WITHOUT CONTRAST
TECHNIQUE: Multiplanar multisequence MR imaging of the abdomen and pelvis was
performed. No intravenous contrast was administered.

[Series 4: cor haste · coronal · 5.0mm · 1.25mm/px · 3 of 44 slices shown]
[im 1/44]
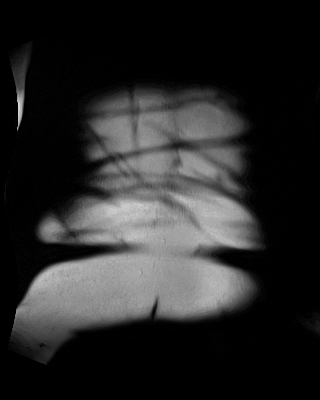
[im 22/44]
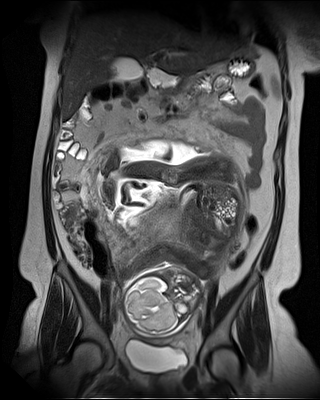
[im 44/44]
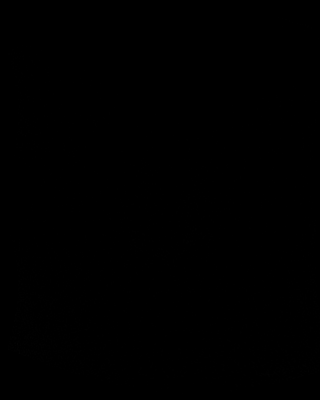

[Series 5: cor haste fs · coronal · 5.0mm · 1.25mm/px · 3 of 44 slices shown]
[im 1/44]
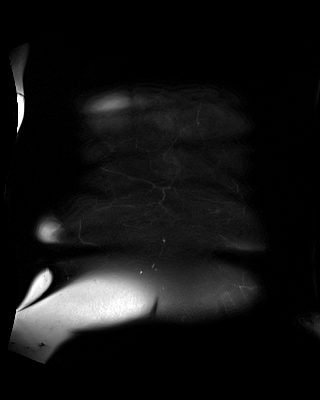
[im 22/44]
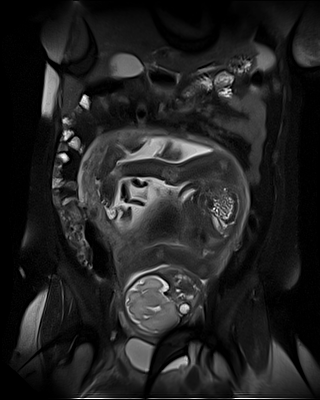
[im 44/44]
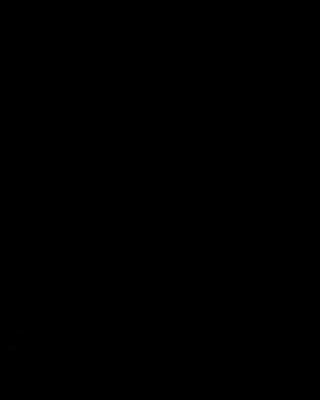

[Series 6: bSSFP · coronal · 5.0mm · 0.78mm/px · 3 of 44 slices shown (1 of 2)]
[im 1/44]
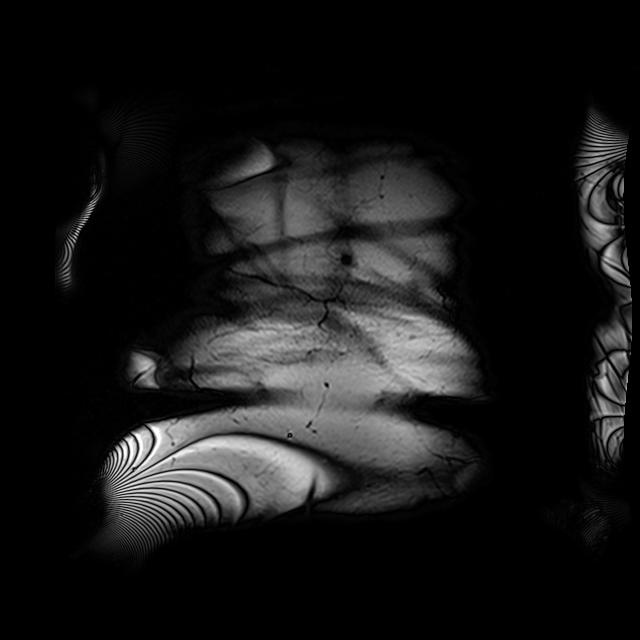
[im 22/44]
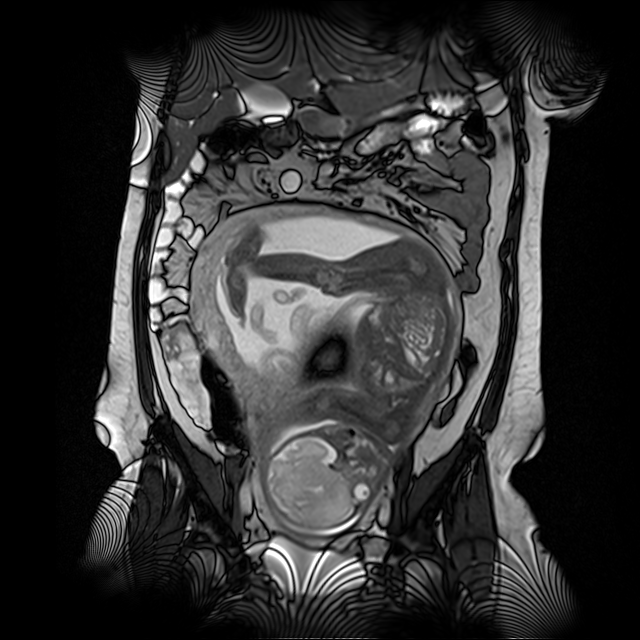
[im 44/44]
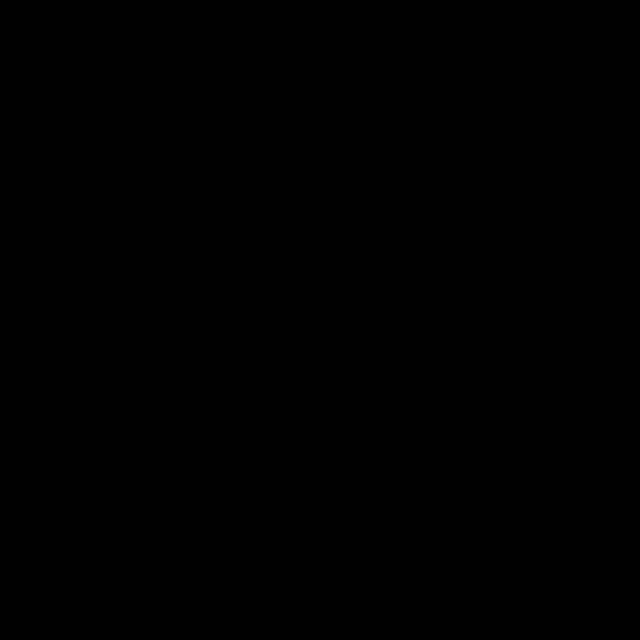

[Series 9: T2 · axial · 4.0mm · 1.19mm/px · z∈[-210,+313]mm · 7 of 110 slices shown (1 of 2)]
[im 1/110]
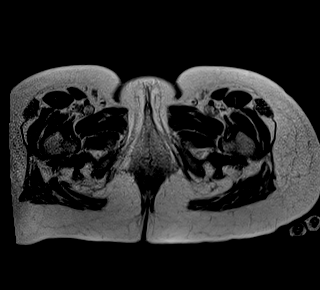
[im 19/110]
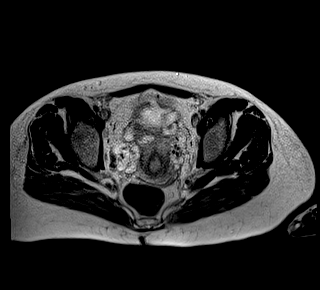
[im 37/110]
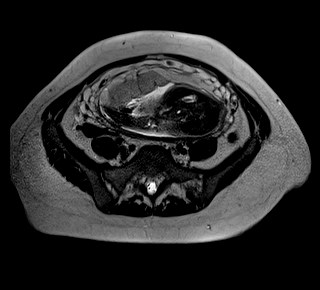
[im 55/110]
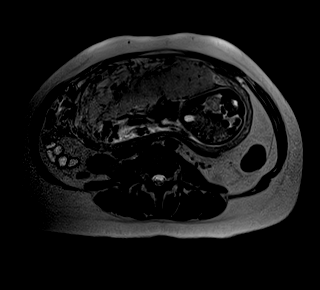
[im 73/110]
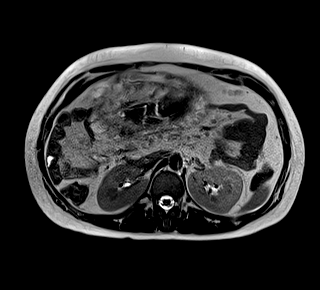
[im 91/110]
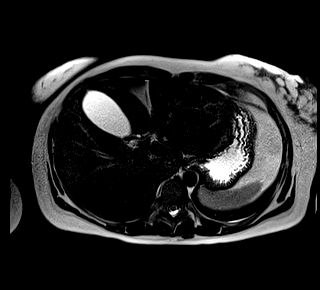
[im 110/110]
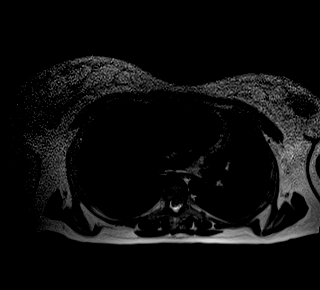

[Series 12: T2 · axial · 4.0mm · 1.19mm/px · z∈[-210,+313]mm · 7 of 110 slices shown (2 of 2)]
[im 1/110]
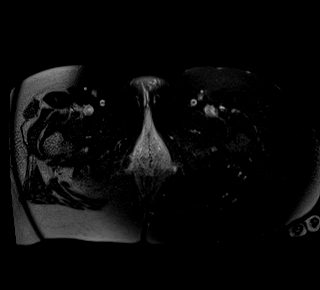
[im 19/110]
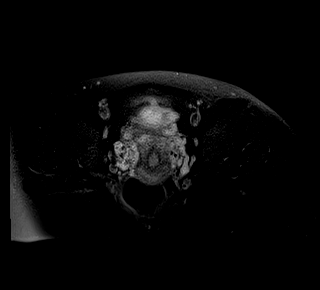
[im 37/110]
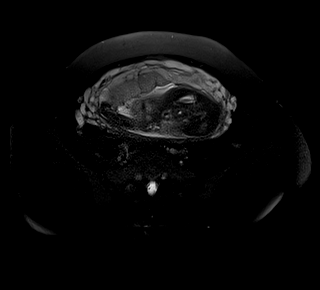
[im 55/110]
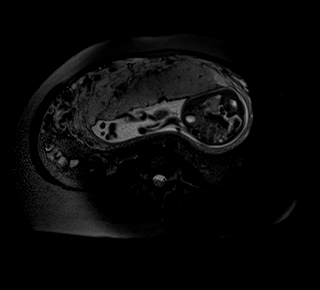
[im 73/110]
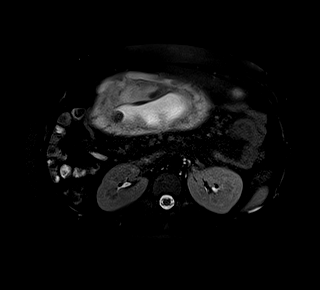
[im 91/110]
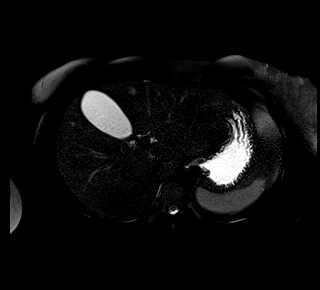
[im 110/110]
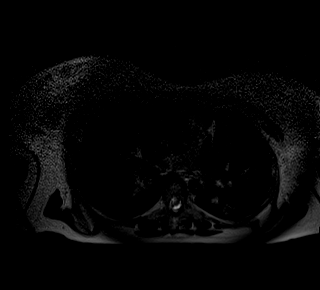

[Series 15: bSSFP · axial · 4.0mm · 0.59mm/px · z∈[-210,+49]mm · 4 of 110 slices shown (2 of 2)]
[im 1/110]
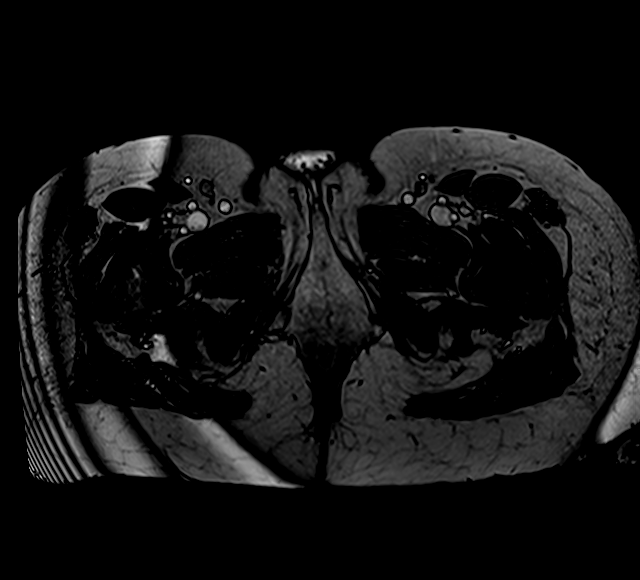
[im 19/110]
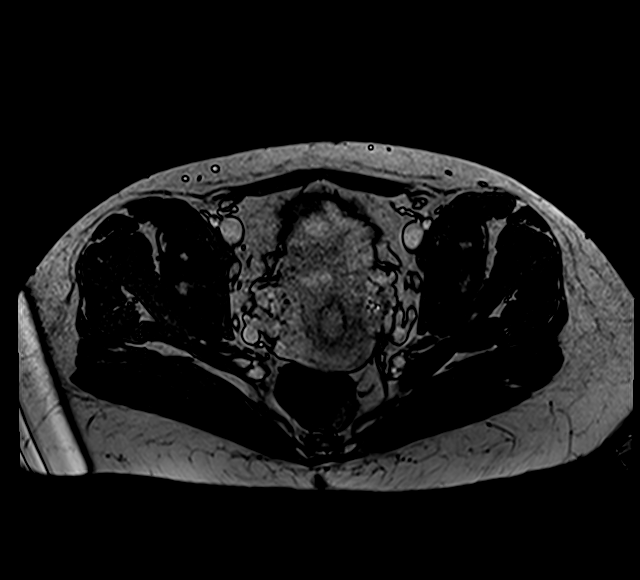
[im 37/110]
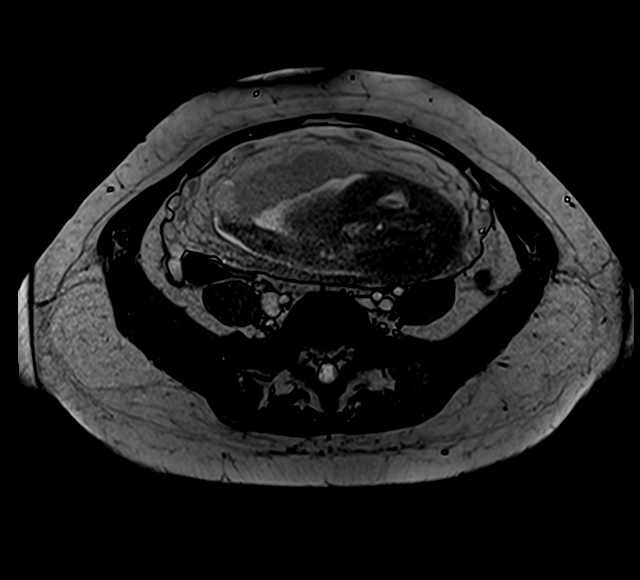
[im 55/110]
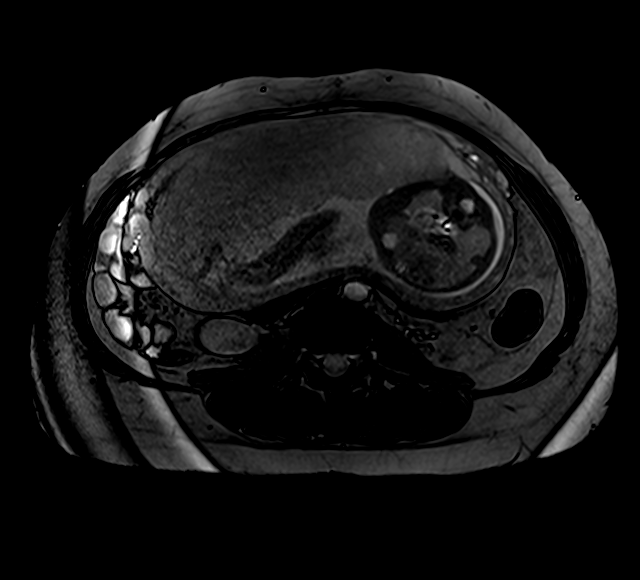

[27 of 48 positions shown; findings below may reference images not displayed]

FINDINGS: COMBINED FINDINGS FOR BOTH MR ABDOMEN AND PELVIS

Lower chest:  The visualized lower chest appears unremarkable.

Hepatobiliary: The liver is normal in signal, without focal
abnormality. The gallbladder is mildly distended, without evidence
of gallstones, gallbladder wall thickening or surrounding
inflammation. There is no biliary dilatation.

Pancreas: Unremarkable. No pancreatic ductal dilatation or
surrounding inflammatory changes.

Spleen: Normal in size without focal abnormality.

Adrenals/Urinary Tract: Both adrenal glands appear normal. Both
kidneys appear normal, without hydronephrosis. The bladder appears
normal.

Stomach/Bowel: The cecum is superiorly displaced from the pelvis by
the gravid uterus. The appendix appears normal. There is no evidence
of bowel wall thickening, distention or surrounding inflammation.
The stomach appears normal for its degree of distention.

Vascular/Lymphatic: There are no enlarged abdominal or pelvic lymph
nodes. No significant vascular findings on noncontrast imaging.

Reproductive: Gravid uterus noted. Fetus is in cephalic presentation
with the placenta anteriorly on the right. The vagina appears
unremarkable. No adnexal mass.

Other: No evidence of abdominal wall mass or hernia. No ascites.

Musculoskeletal: No acute or significant osseous findings.
IMPRESSION: 1. No acute findings or explanation for the patient's symptoms. The
appendix appears normal.
2. The gallbladder is mildly distended without focal abnormality,
within physiologic limits. No hydronephrosis.
3. Gravid uterus without demonstrated complication.

## 2020-11-08 MED ORDER — METOCLOPRAMIDE HCL 10 MG PO TABS: 10.0000 mg | ORAL_TABLET | Freq: Four times a day (QID) | ORAL | 0 refills | Status: DC | PRN

## 2020-11-08 MED ORDER — ONDANSETRON HCL 4 MG/2ML IJ SOLN
4.0000 mg | Freq: Once | INTRAMUSCULAR | Status: AC
  Administered 2020-11-08: 4 mg via INTRAVENOUS
  Filled 2020-11-08: qty 2

## 2020-11-08 MED ORDER — DIPHENHYDRAMINE HCL 25 MG PO CAPS: 25.0000 mg | ORAL_CAPSULE | Freq: Four times a day (QID) | ORAL | 0 refills | Status: DC | PRN

## 2020-11-08 MED ORDER — DEXTROSE 5 % IN LACTATED RINGERS IV BOLUS
1000.0000 mL | Freq: Once | INTRAVENOUS | Status: AC
  Administered 2020-11-08: 1000 mL via INTRAVENOUS
  Filled 2020-11-08 (×2): qty 1000

## 2020-11-08 NOTE — ED Notes (Signed)
Msg sent to pharmacy RE: IVF from pharmacy still have not arrived to either tube station.

## 2020-11-08 NOTE — Discharge Instructions (Addendum)
Your lab tests and MRI of the abdomen were all okay today.  Take medications as prescribed to control your symptoms, and follow a simple bland diet that includes lots of water over the next few days.

## 2020-11-08 NOTE — ED Notes (Signed)
Msg sent to pharmacy to send IVF to [facility].

## 2020-11-08 NOTE — ED Notes (Signed)
IVF still not available from pharmacy at this time.

## 2020-11-08 NOTE — ED Triage Notes (Signed)
Pt to ED for diarrhea and emesis that started yesterday. Denies fevers. Denies pain  [redacted]weeks pregnant NAD noted

## 2020-11-08 NOTE — ED Provider Notes (Signed)
[facility] Emergency Department Provider Note  ____________________________________________  Time seen: Approximately 12:43 PM  I have reviewed the triage vital signs and the nursing notes.   HISTORY  Chief Complaint Diarrhea and Emesis    HPI Kari Frost is a 35 y.o. female who is currently [redacted] weeks pregnant, followed by [facility] who complains of right lower quadrant abdominal pain that started gradually and mildly yesterday morning, associated with diarrhea and vomiting and inability to tolerate oral intake over the last 2 days.  Denies fever.  Symptoms are constant, no aggravating or alleviating factors.  Pain is currently mild to moderate intensity.  No vaginal bleeding or fluid leakage.  No cramping/contraction pain.  She is she is currently feeling normal fetal movements.    Past Medical History:  Diagnosis Date   Genital herpes    Thyroid disease    UTI (urinary tract infection)      Patient Active Problem List   Diagnosis Date Noted   Nausea and vomiting during pregnancy 06/15/2020   Supervision of high risk pregnancy, antepartum 06/13/2020   Obesity affecting pregnancy 06/13/2020   Herpes virus infection during pregnancy 06/13/2020   Mood disorder (HCC) 06/27/2018   Current severe episode of major depressive disorder without psychotic features without prior episode (HCC) 10/14/2017   Class 1 obesity due to excess calories without serious comorbidity with body mass index (BMI) of 30.0 to 30.9 in adult 10/14/2017   Pure hypercholesterolemia 10/14/2017   Displacement of intrauterine contraceptive device 04/10/2016   Thyroid enlargement 11/30/2014   Genital herpes simplex type 2 10/12/2014     Past Surgical History:  Procedure Laterality Date   INTRAUTERINE DEVICE (IUD) INSERTION  2018   INTRAUTERINE DEVICE (IUD) INSERTION N/A 04/26/2016   Procedure: INTRAUTERINE DEVICE (IUD) INSERTION;  Surgeon: Conard Novak, MD;  Location:  [facility] ORS;  Service: Gynecology;  Laterality: N/A;  lot tu01sce exp 09/012020 mirena   IUD REMOVAL N/A 04/26/2016   Procedure: INTRAUTERINE DEVICE (IUD) REMOVAL;  Surgeon: Conard Novak, MD;  Location: [facility] ORS;  Service: Gynecology;  Laterality: N/A;   LAPAROSCOPY N/A 04/26/2016   Procedure: LAPAROSCOPY OPERATIVE;  Surgeon: Conard Novak, MD;  Location: [facility] ORS;  Service: Gynecology;  Laterality: N/A;   nexplanon  2020     Prior to Admission medications   Medication Sig Start Date End Date Taking? Authorizing Provider  diphenhydrAMINE (BENADRYL) 25 mg capsule Take 1 capsule (25 mg total) by mouth every 6 (six) hours as needed (upset stomach or nausea). 11/08/20  Yes Sharman Cheek, MD  metoCLOPramide (REGLAN) 10 MG tablet Take 1 tablet (10 mg total) by mouth every 6 (six) hours as needed. 11/08/20  Yes Sharman Cheek, MD     Allergies Patient has no known allergies.   Family History  Problem Relation Age of Onset   Diabetes Maternal Grandfather     Social History Social History   Tobacco Use   Smoking status: Never   Smokeless tobacco: Never  Vaping Use   Vaping Use: Never used  Substance Use Topics   Alcohol use: No   Drug use: No    Review of Systems  Constitutional:   No fever or chills.  ENT:   No sore throat. No rhinorrhea. Cardiovascular:   No chest pain or syncope. Respiratory:   No dyspnea or cough. Gastrointestinal:   Positive as above for abdominal pain, vomiting and diarrhea.  Musculoskeletal:   Negative for focal pain or swelling All other systems reviewed and  are negative except as documented above in ROS and HPI.  ____________________________________________   PHYSICAL EXAM:  VITAL SIGNS: ED Triage Vitals  Enc Vitals Group     BP 11/08/20 0822 116/76     Pulse Rate 11/08/20 0822 83     Resp 11/08/20 0819 20     Temp 11/08/20 0822 98.3 F (36.8 C)     Temp Source 11/08/20 0822 Oral     SpO2 11/08/20 0822 100 %     Weight  11/08/20 0820 174 lb 2.6 oz (79 kg)     Height 11/08/20 0820 5\' 5"  (1.651 m)     Head Circumference --      Peak Flow --      Pain Score 11/08/20 0819 0     Pain Loc --      Pain Edu? --      Excl. in GC? --     Vital signs reviewed, nursing assessments reviewed.   Constitutional:   Alert and oriented. Non-toxic appearance. Eyes:   Conjunctivae are normal. EOMI. PERRL. ENT      Head:   Normocephalic and atraumatic.      Nose:   Wearing a mask.      Mouth/Throat:   Wearing a mask.      Neck:   No meningismus. Full ROM. Hematological/Lymphatic/Immunilogical:   No cervical lymphadenopathy. Cardiovascular:   RRR. Symmetric bilateral radial and DP pulses.  No murmurs. Cap refill less than 2 seconds. Respiratory:   Normal respiratory effort without tachypnea/retractions. Breath sounds are clear and equal bilaterally. No wheezes/rales/rhonchi. Gastrointestinal:   Soft with focal tenderness in the right lower quadrant.  Gravid abdomen, size consistent with dates. There is no CVA tenderness.  No rebound, rigidity, or guarding. Genitourinary:   deferred Musculoskeletal:   Normal range of motion in all extremities. No joint effusions.  No lower extremity tenderness.  No edema. Neurologic:   Normal speech and language.  Motor grossly intact. No acute focal neurologic deficits are appreciated.  Skin:    Skin is warm, dry and intact. No rash noted.  No petechiae, purpura, or bullae.  ____________________________________________    LABS (pertinent positives/negatives) (all labs ordered are listed, but only abnormal results are displayed) Labs Reviewed  COMPREHENSIVE METABOLIC PANEL - Abnormal; Notable for the following components:      Result Value   Sodium 134 (*)    Creatinine, Ser 0.42 (*)    Calcium 8.6 (*)    Total Protein 6.4 (*)    Albumin 3.4 (*)    AST 13 (*)    All other components within normal limits  CBC - Abnormal; Notable for the following components:   RBC 3.42 (*)     Hemoglobin 11.2 (*)    HCT 32.0 (*)    All other components within normal limits  URINALYSIS, COMPLETE (UACMP) WITH MICROSCOPIC - Abnormal; Notable for the following components:   Color, Urine AMBER (*)    APPearance CLOUDY (*)    Specific Gravity, Urine 1.031 (*)    Ketones, ur 80 (*)    Protein, ur 30 (*)    Leukocytes,Ua MODERATE (*)    Bacteria, UA RARE (*)    All other components within normal limits  RESP PANEL BY RT-PCR (FLU A&B, COVID) ARPGX2  LIPASE, BLOOD   ____________________________________________   EKG    ____________________________________________    RADIOLOGY  MR PELVIS WO CONTRAST  Result Date: 11/08/2020 CLINICAL DATA:  Right lower quadrant abdominal pain with diarrhea and vomiting  since yesterday. Third trimester pregnancy. Clinical concern for appendicitis. EXAM: MRI ABDOMEN AND PELVIS WITHOUT CONTRAST TECHNIQUE: Multiplanar multisequence MR imaging of the abdomen and pelvis was performed. No intravenous contrast was administered. COMPARISON:  Obstetrical ultrasound 10/04/2020 FINDINGS: COMBINED FINDINGS FOR BOTH MR ABDOMEN AND PELVIS Lower chest:  The visualized lower chest appears unremarkable. Hepatobiliary: The liver is normal in signal, without focal abnormality. The gallbladder is mildly distended, without evidence of gallstones, gallbladder wall thickening or surrounding inflammation. There is no biliary dilatation. Pancreas: Unremarkable. No pancreatic ductal dilatation or surrounding inflammatory changes. Spleen: Normal in size without focal abnormality. Adrenals/Urinary Tract: Both adrenal glands appear normal. Both kidneys appear normal, without hydronephrosis. The bladder appears normal. Stomach/Bowel: The cecum is superiorly displaced from the pelvis by the gravid uterus. The appendix appears normal. There is no evidence of bowel wall thickening, distention or surrounding inflammation. The stomach appears normal for its degree of distention.  Vascular/Lymphatic: There are no enlarged abdominal or pelvic lymph nodes. No significant vascular findings on noncontrast imaging. Reproductive: Gravid uterus noted. Fetus is in cephalic presentation with the placenta anteriorly on the right. The vagina appears unremarkable. No adnexal mass. Other: No evidence of abdominal wall mass or hernia. No ascites. Musculoskeletal: No acute or significant osseous findings. IMPRESSION: 1. No acute findings or explanation for the patient's symptoms. The appendix appears normal. 2. The gallbladder is mildly distended without focal abnormality, within physiologic limits. No hydronephrosis. 3. Gravid uterus without demonstrated complication. Electronically Signed   By: Carey Bullocks M.D.   On: 11/08/2020 13:02   MR ABDOMEN WO CONTRAST  Result Date: 11/08/2020 CLINICAL DATA:  Right lower quadrant abdominal pain with diarrhea and vomiting since yesterday. Third trimester pregnancy. Clinical concern for appendicitis. EXAM: MRI ABDOMEN AND PELVIS WITHOUT CONTRAST TECHNIQUE: Multiplanar multisequence MR imaging of the abdomen and pelvis was performed. No intravenous contrast was administered. COMPARISON:  Obstetrical ultrasound 10/04/2020 FINDINGS: COMBINED FINDINGS FOR BOTH MR ABDOMEN AND PELVIS Lower chest:  The visualized lower chest appears unremarkable. Hepatobiliary: The liver is normal in signal, without focal abnormality. The gallbladder is mildly distended, without evidence of gallstones, gallbladder wall thickening or surrounding inflammation. There is no biliary dilatation. Pancreas: Unremarkable. No pancreatic ductal dilatation or surrounding inflammatory changes. Spleen: Normal in size without focal abnormality. Adrenals/Urinary Tract: Both adrenal glands appear normal. Both kidneys appear normal, without hydronephrosis. The bladder appears normal. Stomach/Bowel: The cecum is superiorly displaced from the pelvis by the gravid uterus. The appendix appears normal.  There is no evidence of bowel wall thickening, distention or surrounding inflammation. The stomach appears normal for its degree of distention. Vascular/Lymphatic: There are no enlarged abdominal or pelvic lymph nodes. No significant vascular findings on noncontrast imaging. Reproductive: Gravid uterus noted. Fetus is in cephalic presentation with the placenta anteriorly on the right. The vagina appears unremarkable. No adnexal mass. Other: No evidence of abdominal wall mass or hernia. No ascites. Musculoskeletal: No acute or significant osseous findings. IMPRESSION: 1. No acute findings or explanation for the patient's symptoms. The appendix appears normal. 2. The gallbladder is mildly distended without focal abnormality, within physiologic limits. No hydronephrosis. 3. Gravid uterus without demonstrated complication. Electronically Signed   By: Carey Bullocks M.D.   On: 11/08/2020 13:02    ____________________________________________   PROCEDURES Procedures  ____________________________________________  DIFFERENTIAL DIAGNOSIS   Appendicitis, viral illness, dehydration, gastritis  CLINICAL IMPRESSION / ASSESSMENT AND PLAN / ED COURSE  Medications ordered in the ED: Medications  dextrose 5% lactated ringers bolus 1,000 mL (0  mLs Intravenous Stopped 11/08/20 1544)  ondansetron (ZOFRAN) injection 4 mg (4 mg Intravenous Given 11/08/20 1118)    Pertinent labs & imaging results that were available during my care of the patient were reviewed by me and considered in my medical decision making (see chart for details).  Sharyon Peitz was evaluated in Emergency Department on 11/08/2020 for the symptoms described in the history of present illness. She was evaluated in the context of the global COVID-19 pandemic, which necessitated consideration that the patient might be at risk for infection with the SARS-CoV-2 virus that causes COVID-19. Institutional protocols and algorithms that pertain to the  evaluation of patients at risk for COVID-19 are in a state of rapid change based on information released by regulatory bodies including the CDC and federal and state organizations. These policies and algorithms were followed during the patient's care in the ED.   Patient presents with abdominal pain vomiting diarrhea.  Will need MRI to evaluate for appendicitis.  Labs show signs of dehydration and ketosis due to poor oral intake, will hydrate with D5 LR and give Zofran for her nausea.  Vitals are normal.  Clinical Course as of 11/08/20 1726  Tue Nov 08, 2020  1528 MRI negative for appendicitis or other acute findings.  After hydration if patient can tolerate p.o. she can be discharged [PS]    Clinical Course User Index [PS] Sharman Cheek, MD    ----------------------------------------- 5:26 PM on 11/08/2020 ----------------------------------------- Patient tolerating p.o., feels better, eager for discharge.   ____________________________________________   FINAL CLINICAL IMPRESSION(S) / ED DIAGNOSES    Final diagnoses:  Nausea and vomiting, unspecified vomiting type  Dehydration     ED Discharge Orders          Ordered    metoCLOPramide (REGLAN) 10 MG tablet  Every 6 hours PRN        11/08/20 1725    diphenhydrAMINE (BENADRYL) 25 mg capsule  Every 6 hours PRN        11/08/20 1725            Portions of this note were generated with dragon dictation software. Dictation errors may occur despite best attempts at proofreading.    Sharman Cheek, MD 11/08/20 1726

## 2020-11-08 NOTE — ED Notes (Signed)
Pt given saltine crackers for PO challenge

## 2020-11-08 NOTE — ED Notes (Signed)
See triage note  presents with some n/v/d   states this started yesterday  denies any fever

## 2020-11-08 NOTE — ED Notes (Signed)
IVF still not available to give pt. This RN has spoken to pharmacy staff in the ED as well as sent several msgs to pharmacy.

## 2020-11-08 NOTE — ED Triage Notes (Addendum)
Pt arrived to ed via pov. Pt c/o n/v/d starting this am. Denies any contractions, vaginal discharge, or bleeding. Pt reports left lower pressure in abd but pt MD suggested it was "from baby growing". Pt states primary complaint is n/v/d, and generalized weakness. Ambulatory to triage. NAD noted at this time. This RN contacted L&D to ensure pt did not need to go upstairs and was informed pt needed to be medically cleared first and if any pregnancy complications then pt would need to be sent up to L&D.

## 2020-11-08 NOTE — ED Notes (Signed)
Pt states she was able to eat 1 pack of the saltines; no vomiting after.

## 2020-11-10 ENCOUNTER — Encounter: Payer: Self-pay | Admitting: Obstetrics & Gynecology

## 2020-11-10 ENCOUNTER — Ambulatory Visit (INDEPENDENT_AMBULATORY_CARE_PROVIDER_SITE_OTHER): Payer: BC Managed Care – PPO | Admitting: Obstetrics & Gynecology

## 2020-11-10 ENCOUNTER — Other Ambulatory Visit: Payer: Self-pay

## 2020-11-10 DIAGNOSIS — K529 Noninfective gastroenteritis and colitis, unspecified: Secondary | ICD-10-CM | POA: Diagnosis not present

## 2020-11-10 MED ORDER — ONDANSETRON 4 MG PO TBDP: 4.0000 mg | ORAL_TABLET | Freq: Four times a day (QID) | ORAL | 0 refills | Status: DC | PRN

## 2020-11-10 NOTE — Progress Notes (Signed)
Virtual Visit via Telephone Note  I connected with Kari Frost on 11/10/20 at 11:00 AM EDT by telephone and verified that I am speaking with the correct person using two identifiers.  Location: Patient: Home Provider: Office   I discussed the limitations, risks, security and privacy concerns of performing an evaluation and management service by telephone and the availability of in person appointments. I also discussed with the patient that there may be a patient responsible charge related to this service. The patient expressed understanding and agreed to proceed.  History of Present Illness: Pt is a 35 yo WF with persistent n/v/d since Monday.  Intermittent but occurring enough that she is dehydrated and cannot work.  Also has had lower quadrant abdominal pains; no radiation or other associated sx's or modifiers.  OTC meds no help.  Was seen in ER Tues, with labs and MRI (ruled out appy, chole, other etiologies).  Spent long time there and with little feedback from the ER team.    Good FM.  No VB.     Observations/Objective: No exam today, due to telephone eVisit due to [facility] virus restriction on elective visits and procedures.  Prior visits reviewed along with ultrasounds/labs as indicated.  Assessment and Plan:   ICD-10-CM   1. Gastroenteritis  K52.9     Fluids, rest Zofran for nausea (Rx) Immodium for diarrhea (OTC)  Follow Up Instructions: Over next few days if no improvements GI consult if persists > 1 week Keep OB appt (currently 29 weeks)   I discussed the assessment and treatment plan with the patient. The patient was provided an opportunity to ask questions and all were answered. The patient agreed with the plan and demonstrated an understanding of the instructions.   The patient was advised to call back or seek an in-person evaluation if the symptoms worsen or if the condition fails to improve as anticipated.  I provided 16 minutes of non-face-to-face time during  this encounter.   Letitia Libra, MD

## 2020-11-16 ENCOUNTER — Encounter: Payer: Self-pay | Admitting: Advanced Practice Midwife

## 2020-11-16 ENCOUNTER — Ambulatory Visit (INDEPENDENT_AMBULATORY_CARE_PROVIDER_SITE_OTHER): Payer: BC Managed Care – PPO | Admitting: Advanced Practice Midwife

## 2020-11-16 ENCOUNTER — Other Ambulatory Visit: Payer: Self-pay

## 2020-11-16 VITALS — BP 115/73 | Wt 171.0 lb

## 2020-11-16 DIAGNOSIS — O99213 Obesity complicating pregnancy, third trimester: Secondary | ICD-10-CM

## 2020-11-16 DIAGNOSIS — Z3A30 30 weeks gestation of pregnancy: Secondary | ICD-10-CM

## 2020-11-16 DIAGNOSIS — O0993 Supervision of high risk pregnancy, unspecified, third trimester: Secondary | ICD-10-CM

## 2020-11-16 LAB — POCT URINALYSIS DIPSTICK OB
Glucose, UA: NEGATIVE
POC,PROTEIN,UA: NEGATIVE

## 2020-11-16 NOTE — Progress Notes (Signed)
ROB - concerns she's not gaining weight appropriately. Procedure RM

## 2020-11-16 NOTE — Progress Notes (Signed)
Routine Prenatal Care Visit  Subjective  Kari Frost is a 35 y.o. [phone] at [redacted]w[redacted]d being seen today for ongoing prenatal care.  She is currently monitored for the following issues for this high-risk pregnancy and has Thyroid enlargement; Displacement of intrauterine contraceptive device; Current severe episode of major depressive disorder without psychotic features without prior episode (HCC); Class 1 obesity due to excess calories without serious comorbidity with body mass index (BMI) of 30.0 to 30.9 in adult; Pure hypercholesterolemia; Mood disorder (HCC); Genital herpes simplex type 2; Supervision of high risk pregnancy, antepartum; Obesity affecting pregnancy; Herpes virus infection during pregnancy; and Nausea and vomiting during pregnancy on their problem list.  ----------------------------------------------------------------------------------- Patient reports concern not gaining weight with her pregnancy. She admits good appetite and no vomiting currently. We discussed healthy diet and appropriate caloric intake- 3 meals plus snacks inbetween with adequate protein.  Reassurance given of normal fundal height.  Contractions: Not present. Vag. Bleeding: None.  Movement: Present. Leaking Fluid denies.  ----------------------------------------------------------------------------------- The following portions of the patient's history were reviewed and updated as appropriate: allergies, current medications, past family history, past medical history, past social history, past surgical history and problem list. Problem list updated.  Objective  Blood pressure 115/73, weight 171 lb (77.6 kg), last menstrual period 03/07/2020 Pregravid weight 185 lb (83.9 kg) Total Weight Gain -14 lb (-6.35 kg) Urinalysis: Urine Protein Negative  Urine Glucose Negative  Fetal Status: Fetal Heart Rate (bpm): 135 Fundal Height: 31 cm Movement: Present     General:  Alert, oriented and cooperative. Patient is in no  acute distress.  Skin: Skin is warm and dry. No rash noted.   Cardiovascular: Normal heart rate noted  Respiratory: Normal respiratory effort, no problems with respiration noted  Abdomen: Soft, gravid, appropriate for gestational age. Pain/Pressure: Absent     Pelvic:  Cervical exam deferred        Extremities: Normal range of motion.  Edema: None  Mental Status: Normal mood and affect. Normal behavior. Normal judgment and thought content.   Assessment   35 y.o. [id number] at [redacted]w[redacted]d by  01/23/2021, by Ultrasound presenting for routine prenatal visit  Plan   MAY 2022 Problems (from 06/13/20 to present)    Problem Noted Resolved   Nausea and vomiting during pregnancy 06/15/2020 by Conard Novak, MD No   Supervision of high risk pregnancy, antepartum 06/13/2020 by Conard Novak, MD No   Overview Addendum 11/01/2020  4:11 PM by Mirna Mires, CNM     Nursing Staff Provider  Office Location  [facility] Dating  9 week [place]  Language  English Anatomy US  Normal female  Flu Vaccine   Genetic Screen  NIPS: declined  TDaP vaccine    Hgb A1C or  GTT HgbA1C:4.9   Third trimester : 92  Covid    LAB RESULTS   Rhogam   Blood Type   A+  Feeding Plan  Antibody  neg  Contraception  Rubella  immune  Circumcision  RPR   neg  Pediatrician   HBsAg   neg  Support Person  HIV  neg  Prenatal Classes  Varicella immune    GBS  (For PCN allergy, check sensitivities)   BTL Consent     VBAC Consent  Pap      Hgb Electro      CF      SMA               Obesity affecting pregnancy 06/13/2020 by [name]  D, MD No   Herpes virus infection during pregnancy 06/13/2020 by Conard Novak, MD No   Overview Signed 06/13/2020  3:40 PM by Conard Novak, MD    [ ]  ppx at 36 wks          Preterm labor symptoms and general obstetric precautions including but not limited to vaginal bleeding, contractions, leaking of fluid and fetal movement were reviewed in detail with the patient. Please  refer to After Visit Summary for other counseling recommendations.   Offer TDAP at next visit Return in about 2 weeks (around 11/30/2020) for rob.  13/03/2020, CNM 11/16/2020 10:50 AM

## 2020-11-18 ENCOUNTER — Encounter: Payer: BC Managed Care – PPO | Admitting: Obstetrics

## 2020-12-02 ENCOUNTER — Encounter: Payer: Self-pay | Admitting: Obstetrics and Gynecology

## 2020-12-02 ENCOUNTER — Other Ambulatory Visit: Payer: Self-pay

## 2020-12-02 ENCOUNTER — Ambulatory Visit (INDEPENDENT_AMBULATORY_CARE_PROVIDER_SITE_OTHER): Payer: BC Managed Care – PPO | Admitting: Obstetrics and Gynecology

## 2020-12-02 VITALS — BP 118/78 | Wt 177.0 lb

## 2020-12-02 DIAGNOSIS — Z3A32 32 weeks gestation of pregnancy: Secondary | ICD-10-CM

## 2020-12-02 DIAGNOSIS — O98513 Other viral diseases complicating pregnancy, third trimester: Secondary | ICD-10-CM

## 2020-12-02 DIAGNOSIS — B009 Herpesviral infection, unspecified: Secondary | ICD-10-CM

## 2020-12-02 DIAGNOSIS — O0993 Supervision of high risk pregnancy, unspecified, third trimester: Secondary | ICD-10-CM

## 2020-12-02 DIAGNOSIS — O99213 Obesity complicating pregnancy, third trimester: Secondary | ICD-10-CM

## 2020-12-02 NOTE — Progress Notes (Signed)
Routine Prenatal Care Visit  Subjective  Kari Frost is a 35 y.o. [id number] at [redacted]w[redacted]d being seen today for ongoing prenatal care.  She is currently monitored for the following issues for this low-risk pregnancy and has Thyroid enlargement; Displacement of intrauterine contraceptive device; Current severe episode of major depressive disorder without psychotic features without prior episode (HCC); Class 1 obesity due to excess calories without serious comorbidity with body mass index (BMI) of 30.0 to 30.9 in adult; Pure hypercholesterolemia; Mood disorder (HCC); Genital herpes simplex type 2; Supervision of high risk pregnancy, antepartum; Obesity affecting pregnancy; Herpes virus infection during pregnancy; and Nausea and vomiting during pregnancy on their problem list.  ----------------------------------------------------------------------------------- Patient reports no complaints.   Contractions: Not present. Vag. Bleeding: None.  Movement: Present. Leaking Fluid denies.  ----------------------------------------------------------------------------------- The following portions of the patient's history were reviewed and updated as appropriate: allergies, current medications, past family history, past medical history, past social history, past surgical history and problem list. Problem list updated.  Objective  Blood pressure 118/78, weight 177 lb (80.3 kg), last menstrual period 03/07/2020, unknown if currently breastfeeding. Pregravid weight 185 lb (83.9 kg) Total Weight Gain -8 lb (-3.629 kg) Urinalysis: Urine Protein    Urine Glucose    Fetal Status: Fetal Heart Rate (bpm): 145 Fundal Height: 32 cm Movement: Present     General:  Alert, oriented and cooperative. Patient is in no acute distress.  Skin: Skin is warm and dry. No rash noted.   Cardiovascular: Normal heart rate noted  Respiratory: Normal respiratory effort, no problems with respiration noted  Abdomen: Soft, gravid, appropriate  for gestational age. Pain/Pressure: Absent     Pelvic:  Cervical exam deferred        Extremities: Normal range of motion.  Edema: None  Mental Status: Normal mood and affect. Normal behavior. Normal judgment and thought content.   Assessment   35 y.o. [id number] at [redacted]w[redacted]d by  01/23/2021, by Ultrasound presenting for routine prenatal visit  Plan   MAY 2022 Problems (from 06/13/20 to present)     Problem Noted Resolved   Nausea and vomiting during pregnancy 06/15/2020 by Conard Novak, MD No   Supervision of high risk pregnancy, antepartum 06/13/2020 by Conard Novak, MD No   Overview Addendum 11/01/2020  4:11 PM by Mirna Mires, CNM     Nursing Staff Provider  Office Location  [facility] Dating  9 week [place]  Language  English Anatomy US  Normal female  Flu Vaccine   Genetic Screen  NIPS: declined  TDaP vaccine    Hgb A1C or  GTT HgbA1C:4.9   Third trimester : 13  Covid    LAB RESULTS   Rhogam   Blood Type   A+  Feeding Plan  Antibody  neg  Contraception  Rubella  immune  Circumcision  RPR   neg  Pediatrician   HBsAg   neg  Support Person  HIV  neg  Prenatal Classes  Varicella immune    GBS  (For PCN allergy, check sensitivities)   BTL Consent     VBAC Consent  Pap      Hgb Electro      CF      SMA              Obesity affecting pregnancy 06/13/2020 by Conard Novak, MD No   Herpes virus infection during pregnancy 06/13/2020 by Conard Novak, MD No   Overview Signed 06/13/2020  3:40 PM by Conard Novak,  MD    [ ]  ppx at 36 wks           Preterm labor symptoms and general obstetric precautions including but not limited to vaginal bleeding, contractions, leaking of fluid and fetal movement were reviewed in detail with the patient. Please refer to After Visit Summary for other counseling recommendations.   - TDAP today  Return in about 2 weeks (around 12/16/2020) for ROB.   12/18/2020, MD, [name] [facility], [facility] 12/02/2020 4:21 PM

## 2020-12-02 NOTE — Addendum Note (Signed)
Addended by: Liliane Shi on: 12/02/2020 04:32 PM   Modules accepted: Orders

## 2020-12-12 ENCOUNTER — Telehealth: Payer: Self-pay

## 2020-12-12 DIAGNOSIS — Z23 Encounter for immunization: Secondary | ICD-10-CM

## 2020-12-12 NOTE — Telephone Encounter (Signed)
Pt calling; 34wks; clear d/c c a little bit of snot; [phone]  Pt states she feels fine; baby is moving good; pressure at the top of her abdomen.  Adv the snot like substance is part of her mucus plug; it doesn't mean she is going in to labor; to check panty liner in an hour; if she is unable to stay dry and it doesn't smell like urine she needs to be seen.

## 2020-12-14 ENCOUNTER — Other Ambulatory Visit: Payer: Self-pay

## 2020-12-14 ENCOUNTER — Ambulatory Visit (INDEPENDENT_AMBULATORY_CARE_PROVIDER_SITE_OTHER): Payer: BC Managed Care – PPO | Admitting: Advanced Practice Midwife

## 2020-12-14 ENCOUNTER — Encounter: Payer: Self-pay | Admitting: Advanced Practice Midwife

## 2020-12-14 VITALS — BP 108/70 | Wt 175.0 lb

## 2020-12-14 DIAGNOSIS — O0993 Supervision of high risk pregnancy, unspecified, third trimester: Secondary | ICD-10-CM

## 2020-12-14 DIAGNOSIS — B009 Herpesviral infection, unspecified: Secondary | ICD-10-CM

## 2020-12-14 DIAGNOSIS — O99213 Obesity complicating pregnancy, third trimester: Secondary | ICD-10-CM

## 2020-12-14 DIAGNOSIS — O98513 Other viral diseases complicating pregnancy, third trimester: Secondary | ICD-10-CM

## 2020-12-14 DIAGNOSIS — Z3A34 34 weeks gestation of pregnancy: Secondary | ICD-10-CM

## 2020-12-14 LAB — POCT URINALYSIS DIPSTICK OB
Glucose, UA: NEGATIVE
POC,PROTEIN,UA: NEGATIVE

## 2020-12-14 MED ORDER — VALACYCLOVIR HCL 500 MG PO TABS: 500.0000 mg | ORAL_TABLET | Freq: Two times a day (BID) | ORAL | 6 refills | Status: DC

## 2020-12-14 NOTE — Progress Notes (Addendum)
Routine Prenatal Care Visit  Subjective  Kari Frost is a 35 y.o. [id number] at [redacted]w[redacted]d being seen today for ongoing prenatal care.  She is currently monitored for the following issues for this high-risk pregnancy and has Thyroid enlargement; Displacement of intrauterine contraceptive device; Current severe episode of major depressive disorder without psychotic features without prior episode ([place]); Class 1 obesity due to excess calories without serious comorbidity with body mass index (BMI) of 30.0 to 30.9 in adult; Pure hypercholesterolemia; Mood disorder ([place]); Genital herpes simplex type 2; Supervision of high risk pregnancy, antepartum; Obesity affecting pregnancy; Herpes virus infection during pregnancy; and Nausea and vomiting during pregnancy on their problem list.  ----------------------------------------------------------------------------------- Patient reports no complaints.  Advised start taking valtrex suppressive therapy at 36 weeks. Rx sent today. Contractions: Not present. Vag. Bleeding: None.  Movement: Present. Leaking Fluid denies.  ----------------------------------------------------------------------------------- The following portions of the patient's history were reviewed and updated as appropriate: allergies, current medications, past family history, past medical history, past social history, past surgical history and problem list. Problem list updated.  Objective  Blood pressure 108/70, weight 175 lb (79.4 kg), last menstrual period 03/07/2020 Pregravid weight 185 lb (83.9 kg) Total Weight Gain -10 lb (-4.536 kg) Urinalysis: Urine Protein Negative  Urine Glucose Negative  Fetal Status: Fetal Heart Rate (bpm): 132 Fundal Height: 35 cm Movement: Present     General:  Alert, oriented and cooperative. Patient is in no acute distress.  Skin: Skin is warm and dry. No rash noted.   Cardiovascular: Normal heart rate noted  Respiratory: Normal respiratory effort, no problems with  respiration noted  Abdomen: Soft, gravid, appropriate for gestational age. Pain/Pressure: Present     Pelvic:  Cervical exam deferred        Extremities: Normal range of motion.  Edema: None  Mental Status: Normal mood and affect. Normal behavior. Normal judgment and thought content.   Assessment   35 y.o. [id number] at [redacted]w[redacted]d by  01/23/2021, by Ultrasound presenting for routine prenatal visit  Plan   MAY 2022 Problems (from 06/13/20 to present)     Problem Noted Resolved   Nausea and vomiting during pregnancy 06/15/2020 by Will Bonnet, MD No   Supervision of high risk pregnancy, antepartum 06/13/2020 by Will Bonnet, MD No   Overview Addendum 11/01/2020  4:11 PM by Imagene Riches, CNM     Nursing Staff Provider  Office Location  [facility] Dating  9 week [place]  Language  English Anatomy US  Normal female  Flu Vaccine   Genetic Screen  NIPS: declined  TDaP vaccine    Hgb A1C or  GTT HgbA1C:4.9   Third trimester : 110  Covid    LAB RESULTS   Rhogam   Blood Type   A+  Feeding Plan  Antibody  neg  Contraception  Rubella  immune  Circumcision  RPR   neg  Pediatrician   HBsAg   neg  Support Person  HIV  neg  Prenatal Classes  Varicella immune    GBS  (For PCN allergy, check sensitivities)   BTL Consent     VBAC Consent  Pap      Hgb Electro      CF      SMA               Obesity affecting pregnancy 06/13/2020 by Will Bonnet, MD No   Herpes virus infection during pregnancy 06/13/2020 by Will Bonnet, MD No   Overview Signed 06/13/2020  3:40 PM by Conard Novak, MD    [ ]  ppx at 36 wks           Preterm labor symptoms and general obstetric precautions including but not limited to vaginal bleeding, contractions, leaking of fluid and fetal movement were reviewed in detail with the patient.   Return in about 2 weeks (around 12/28/2020) for rob/gbs.  12/30/2020, CNM 12/14/2020 9:30 AM

## 2020-12-16 ENCOUNTER — Encounter: Payer: BC Managed Care – PPO | Admitting: Obstetrics

## 2020-12-28 ENCOUNTER — Ambulatory Visit (INDEPENDENT_AMBULATORY_CARE_PROVIDER_SITE_OTHER): Payer: BC Managed Care – PPO | Admitting: Obstetrics & Gynecology

## 2020-12-28 ENCOUNTER — Encounter: Payer: Self-pay | Admitting: Obstetrics & Gynecology

## 2020-12-28 ENCOUNTER — Other Ambulatory Visit: Payer: Self-pay

## 2020-12-28 VITALS — BP 120/80 | Wt 179.0 lb

## 2020-12-28 DIAGNOSIS — Z3A36 36 weeks gestation of pregnancy: Secondary | ICD-10-CM

## 2020-12-28 DIAGNOSIS — O0990 Supervision of high risk pregnancy, unspecified, unspecified trimester: Secondary | ICD-10-CM

## 2020-12-28 DIAGNOSIS — O99213 Obesity complicating pregnancy, third trimester: Secondary | ICD-10-CM

## 2020-12-28 DIAGNOSIS — O0993 Supervision of high risk pregnancy, unspecified, third trimester: Secondary | ICD-10-CM

## 2020-12-28 DIAGNOSIS — Z3685 Encounter for antenatal screening for Streptococcus B: Secondary | ICD-10-CM

## 2020-12-28 LAB — POCT URINALYSIS DIPSTICK OB: Glucose, UA: NEGATIVE

## 2020-12-28 NOTE — Patient Instructions (Signed)

## 2020-12-28 NOTE — Progress Notes (Signed)
  Subjective  Fetal Movement? yes Contractions? occas BHs Leaking Fluid? no Vaginal Bleeding? no  Objective  BP 120/80   Wt 179 lb (81.2 kg)   LMP 03/07/2020 (LMP Unknown)   BMI 29.79 kg/m  General: NAD Pumonary: no increased work of breathing Abdomen: gravid, non-tender Extremities: no edema Psychiatric: mood appropriate, affect full SVE 0/30 and soft/-3 Assessment  35 y.o. [id number] at [redacted]w[redacted]d by  01/23/2021, by Ultrasound presenting for routine prenatal visit  Plan   Problem List Items Addressed This Visit      Other   Supervision of high risk pregnancy, antepartum   Obesity affecting pregnancy  Other Visit Diagnoses    Supervision of high risk pregnancy in third trimester    -  Primary   Relevant Orders   POC Urinalysis Dipstick OB (Completed)   [redacted] weeks gestation of pregnancy       Encounter for antenatal screening for Streptococcus B       Relevant Orders   Culture, beta strep (group b only)    Plans to breast feed Plans BTL after delivery Valtrex to prevent HSV PNV, [facility]  MAY 2022 Problems (from 06/13/20 to present)    Problem Noted Resolved   Nausea and vomiting during pregnancy 06/15/2020 by Conard Novak, MD No   Supervision of high risk pregnancy, antepartum 06/13/2020 by Conard Novak, MD No   Overview Addendum 12/28/2020  4:41 PM by Nadara Mustard, MD     Nursing Staff Provider  Office Location  [facility] Dating  9 week [place]  Language  English Anatomy US  Normal female  Flu Vaccine  decl Genetic Screen  NIPS: declined  TDaP vaccine   12/02/20 Hgb A1C or  GTT HgbA1C:4.9   Third trimester : 88  Covid    LAB RESULTS   Rhogam  n/a Blood Type   A+  Feeding Plan Breast Antibody  neg  Contraception BTL Rubella  immune  Circumcision n/a RPR   neg  Pediatrician   HBsAg   neg  Support Person  HIV  neg  Prenatal Classes  Varicella immune    GBS  (For PCN allergy, check sensitivities)   BTL Consent N/a, private ins    VBAC Consent no Pap  2020; needs  pp         Obesity affecting pregnancy 06/13/2020 by Conard Novak, MD No   Herpes virus infection during pregnancy 06/13/2020 by Conard Novak, MD No   Overview Signed 06/13/2020  3:40 PM by Conard Novak, MD    [ ]  ppx at 36 wks          , MD, Annamarie Major Ob/Gyn, [facility] 12/28/2020  4:42 PM

## 2021-01-01 LAB — CULTURE, BETA STREP (GROUP B ONLY): Strep Gp B Culture: NEGATIVE

## 2021-01-04 ENCOUNTER — Ambulatory Visit (INDEPENDENT_AMBULATORY_CARE_PROVIDER_SITE_OTHER): Payer: BC Managed Care – PPO | Admitting: Obstetrics

## 2021-01-04 ENCOUNTER — Other Ambulatory Visit: Payer: Self-pay

## 2021-01-04 VITALS — BP 122/84 | Wt 179.0 lb

## 2021-01-04 DIAGNOSIS — O0993 Supervision of high risk pregnancy, unspecified, third trimester: Secondary | ICD-10-CM

## 2021-01-04 DIAGNOSIS — Z3A37 37 weeks gestation of pregnancy: Secondary | ICD-10-CM

## 2021-01-04 NOTE — Progress Notes (Signed)
No vb. No lof. Pt having some back pain.

## 2021-01-04 NOTE — Progress Notes (Signed)
Routine Prenatal Care Visit  Subjective  Kari Frost is a 35 y.o. [phone] at [redacted]w[redacted]d being seen today for ongoing prenatal care.  She is currently monitored for the following issues for this high-risk pregnancy and has Thyroid enlargement; Displacement of intrauterine contraceptive device; Current severe episode of major depressive disorder without psychotic features without prior episode ([place]); Class 1 obesity due to excess calories without serious comorbidity with body mass index (BMI) of 30.0 to 30.9 in adult; Pure hypercholesterolemia; Mood disorder ([place]); Genital herpes simplex type 2; Supervision of high risk pregnancy, antepartum; Obesity affecting pregnancy; Herpes virus infection during pregnancy; and Nausea and vomiting during pregnancy on their problem list.  ----------------------------------------------------------------------------------- Patient reports no complaints.  She is starting to feel more pelvic pressure, no contractions. Eager to be "done" Contractions: Not present. Vag. Bleeding: None.  Movement: Present. Leaking Fluid denies.  ----------------------------------------------------------------------------------- The following portions of the patient's history were reviewed and updated as appropriate: allergies, current medications, past family history, past medical history, past social history, past surgical history and problem list. Problem list updated.  Objective  Blood pressure 122/84, weight 179 lb (81.2 kg), last menstrual period 03/07/2020, unknown if currently breastfeeding. Pregravid weight 185 lb (83.9 kg) Total Weight Gain -6 lb (-2.722 kg) Urinalysis: Urine Protein    Urine Glucose    Fetal Status:     Movement: Present     General:  Alert, oriented and cooperative. Patient is in no acute distress.  Skin: Skin is warm and dry. No rash noted.   Cardiovascular: Normal heart rate noted  Respiratory: Normal respiratory effort, no problems with respiration noted   Abdomen: Soft, gravid, appropriate for gestational age. Pain/Pressure: Present     Pelvic:  Cervical exam deferred        Extremities: Normal range of motion.     Mental Status: Normal mood and affect. Normal behavior. Normal judgment and thought content.   Assessment   35 y.o. [id number] at [redacted]w[redacted]d by  01/23/2021, by Ultrasound presenting for routine prenatal visit  Plan   MAY 2022 Problems (from 06/13/20 to present)    Problem Noted Resolved   Nausea and vomiting during pregnancy 06/15/2020 by Will Bonnet, MD No   Supervision of high risk pregnancy, antepartum 06/13/2020 by Will Bonnet, MD No   Overview Addendum 12/28/2020  4:41 PM by Gae Dry, MD     Nursing Staff Provider  Office Location  [facility] Dating  9 week [place]  Language  English Anatomy US  Normal female  Flu Vaccine  decl Genetic Screen  NIPS: declined  TDaP vaccine   12/02/20 Hgb A1C or  GTT HgbA1C:4.9   Third trimester : 37  Covid    LAB RESULTS   Rhogam  n/a Blood Type   A+  Feeding Plan Breast Antibody  neg  Contraception BTL Rubella  immune  Circumcision n/a RPR   neg  Pediatrician   HBsAg   neg  Support Person  HIV  neg  Prenatal Classes  Varicella immune    GBS  (For PCN allergy, check sensitivities)   BTL Consent N/a, private ins    VBAC Consent no Pap  2020; needs pp         Obesity affecting pregnancy 06/13/2020 by Will Bonnet, MD No   Herpes virus infection during pregnancy 06/13/2020 by Will Bonnet, MD No   Overview Signed 06/13/2020  3:40 PM by Will Bonnet, MD    [ ]  ppx at 36 wks  Term labor symptoms and general obstetric precautions including but not limite Consented for BTL today. Signed forms.   Return in about 1 week (around 01/11/2021) for return OB.  Mirna Mires, CNM  01/04/2021 8:29 AM

## 2021-01-10 ENCOUNTER — Other Ambulatory Visit: Payer: Self-pay

## 2021-01-10 ENCOUNTER — Ambulatory Visit (INDEPENDENT_AMBULATORY_CARE_PROVIDER_SITE_OTHER): Payer: BC Managed Care – PPO | Admitting: Obstetrics

## 2021-01-10 VITALS — BP 126/84 | Wt 181.0 lb

## 2021-01-10 DIAGNOSIS — O0993 Supervision of high risk pregnancy, unspecified, third trimester: Secondary | ICD-10-CM

## 2021-01-10 DIAGNOSIS — O0990 Supervision of high risk pregnancy, unspecified, unspecified trimester: Secondary | ICD-10-CM

## 2021-01-10 DIAGNOSIS — Z3A38 38 weeks gestation of pregnancy: Secondary | ICD-10-CM

## 2021-01-10 NOTE — Progress Notes (Signed)
Routine Prenatal Care Visit  Subjective  Kari Frost is a 35 y.o. [id number] at [redacted]w[redacted]d being seen today for ongoing prenatal care.  She is currently monitored for the following issues for this low-risk pregnancy and has Thyroid enlargement; Displacement of intrauterine contraceptive device; Current severe episode of major depressive disorder without psychotic features without prior episode (HCC); Class 1 obesity due to excess calories without serious comorbidity with body mass index (BMI) of 30.0 to 30.9 in adult; Pure hypercholesterolemia; Mood disorder (HCC); Genital herpes simplex type 2; Supervision of high risk pregnancy, antepartum; Obesity affecting pregnancy; Herpes virus infection during pregnancy; and Nausea and vomiting during pregnancy on their problem list.  ----------------------------------------------------------------------------------- Patient reports no complaints.   Contractions: Not present. Vag. Bleeding: None.  Movement: Present. Leaking Fluid denies.  ----------------------------------------------------------------------------------- The following portions of the patient's history were reviewed and updated as appropriate: allergies, current medications, past family history, past medical history, past social history, past surgical history and problem list. Problem list updated.  Objective  Blood pressure 126/84, weight 181 lb (82.1 kg), last menstrual period 03/07/2020, unknown if currently breastfeeding. Pregravid weight 185 lb (83.9 kg) Total Weight Gain -4 lb (-1.814 kg) Urinalysis: Urine Protein    Urine Glucose    Fetal Status:     Movement: Present     General:  Alert, oriented and cooperative. Patient is in no acute distress.  Skin: Skin is warm and dry. No rash noted.   Cardiovascular: Normal heart rate noted  Respiratory: Normal respiratory effort, no problems with respiration noted  Abdomen: Soft, gravid, appropriate for gestational age. Pain/Pressure: Present      Pelvic:  Cervical exam performed      1cm/pretty thick but softening/ballotable. EFW 6 lbs  Extremities: Normal range of motion.     Mental Status: Normal mood and affect. Normal behavior. Normal judgment and thought content.   Assessment   35 y.o. [id number] at [redacted]w[redacted]d by  01/23/2021, by Ultrasound presenting for routine prenatal visit  Plan   MAY 2022 Problems (from 06/13/20 to present)    Problem Noted Resolved   Nausea and vomiting during pregnancy 06/15/2020 by Conard Novak, MD No   Supervision of high risk pregnancy, antepartum 06/13/2020 by Conard Novak, MD No   Overview Addendum 01/10/2021  4:45 PM by Mirna Mires, CNM     Nursing Staff Provider  Office Location  [facility] Dating  9 week [place]  Language  English Anatomy US  Normal female  Flu Vaccine  decl Genetic Screen  NIPS: declined  TDaP vaccine   12/02/20 Hgb A1C or  GTT HgbA1C:4.9   Third trimester : 42  Covid    LAB RESULTS   Rhogam  n/a Blood Type   A+  Feeding Plan Breast Antibody  neg  Contraception BTL Rubella  immune  Circumcision n/a RPR   neg  Pediatrician   HBsAg   neg  Support Person  HIV  neg  Prenatal Classes  Varicella immune    GBS  (For PCN allergy, check sensitivities) negative  BTL Consent N/a, private ins    VBAC Consent no Pap  2020; needs pp         Obesity affecting pregnancy 06/13/2020 by Conard Novak, MD No   Herpes virus infection during pregnancy 06/13/2020 by Conard Novak, MD No   Overview Signed 06/13/2020  3:40 PM by Conard Novak, MD    [ ]  ppx at 36 wks  Term labor symptoms and general obstetric precautions including but not limited to vaginal bleeding, contractions, leaking of fluid and fetal movement were reviewed in detail with the patient. Please refer to After Visit Summary for other counseling recommendations.   Return in about 1 week (around 01/17/2021) for return OB.  Mirna Mires, CNM  01/10/2021 4:46 PM

## 2021-01-10 NOTE — Progress Notes (Signed)
No vb. No lof. Had some back pain last night.

## 2021-01-18 ENCOUNTER — Other Ambulatory Visit: Payer: Self-pay

## 2021-01-18 ENCOUNTER — Encounter: Payer: Self-pay | Admitting: Advanced Practice Midwife

## 2021-01-18 ENCOUNTER — Ambulatory Visit (INDEPENDENT_AMBULATORY_CARE_PROVIDER_SITE_OTHER): Payer: BC Managed Care – PPO | Admitting: Advanced Practice Midwife

## 2021-01-18 VITALS — BP 120/70 | Wt 179.8 lb

## 2021-01-18 DIAGNOSIS — A6000 Herpesviral infection of urogenital system, unspecified: Secondary | ICD-10-CM

## 2021-01-18 DIAGNOSIS — O0990 Supervision of high risk pregnancy, unspecified, unspecified trimester: Secondary | ICD-10-CM

## 2021-01-18 DIAGNOSIS — Z3A39 39 weeks gestation of pregnancy: Secondary | ICD-10-CM

## 2021-01-18 DIAGNOSIS — F39 Unspecified mood [affective] disorder: Secondary | ICD-10-CM

## 2021-01-18 NOTE — Progress Notes (Signed)
Routine Prenatal Care Visit  Subjective  Kari Frost is a 35 y.o. [id number] at [redacted]w[redacted]d being seen today for ongoing prenatal care.  She is currently monitored for the following issues for this high-risk pregnancy and has Thyroid enlargement; Displacement of intrauterine contraceptive device; Current severe episode of major depressive disorder without psychotic features without prior episode ([place]); Class 1 obesity due to excess calories without serious comorbidity with body mass index (BMI) of 30.0 to 30.9 in adult; Pure hypercholesterolemia; Mood disorder ([place]); Genital herpes simplex type 2; Supervision of high risk pregnancy, antepartum; Obesity affecting pregnancy; Herpes virus infection during pregnancy; and Nausea and vomiting during pregnancy on their problem list.  ----------------------------------------------------------------------------------- Patient reports decreased fetal movement in the past week. She feels baby every day with fewer moves than previously.  Reviewed fetal kick counts and precautions. Contractions: Irregular. Vag. Bleeding: None.  Movement: Present. Leaking Fluid denies.  ----------------------------------------------------------------------------------- The following portions of the patient's history were reviewed and updated as appropriate: allergies, current medications, past family history, past medical history, past social history, past surgical history and problem list. Problem list updated.  Objective  Blood pressure 120/70, weight 179 lb 12.8 oz (81.6 kg), last menstrual period 03/07/2020, unknown if currently breastfeeding. Pregravid weight 185 lb (83.9 kg) Total Weight Gain -5 lb 3.2 oz (-2.359 kg) Urinalysis: Urine Protein    Urine Glucose    Fetal Status: Fetal Heart Rate (bpm): 141 Fundal Height: 38 cm Movement: Present     Listened for several minutes with doppler. Baseline likely 140s with acceleration to 160s.  General:  Alert, oriented and  cooperative. Patient is in no acute distress.  Skin: Skin is warm and dry. No rash noted.   Cardiovascular: Normal heart rate noted  Respiratory: Normal respiratory effort, no problems with respiration noted  Abdomen: Soft, gravid, appropriate for gestational age. Pain/Pressure: Present     Pelvic:  Cervical exam deferred        Extremities: Normal range of motion.  Edema: None  Mental Status: Normal mood and affect. Normal behavior. Normal judgment and thought content.   Assessment   35 y.o. [id number] at [redacted]w[redacted]d by  01/23/2021, by Ultrasound presenting for work-in prenatal visit  Plan   MAY 2022 Problems (from 06/13/20 to present)    Problem Noted Resolved   Nausea and vomiting during pregnancy 06/15/2020 by Will Bonnet, MD No   Supervision of high risk pregnancy, antepartum 06/13/2020 by Will Bonnet, MD No   Overview Addendum 01/10/2021  4:45 PM by Imagene Riches, CNM     Nursing Staff Provider  Office Location  [facility] Dating  9 week [place]  Language  English Anatomy US  Normal female  Flu Vaccine  decl Genetic Screen  NIPS: declined  TDaP vaccine   12/02/20 Hgb A1C or  GTT HgbA1C:4.9   Third trimester : 56  Covid    LAB RESULTS   Rhogam  n/a Blood Type   A+  Feeding Plan Breast Antibody  neg  Contraception BTL Rubella  immune  Circumcision n/a RPR   neg  Pediatrician   HBsAg   neg  Support Person  HIV  neg  Prenatal Classes  Varicella immune    GBS  (For PCN allergy, check sensitivities) negative  BTL Consent N/a, private ins    VBAC Consent no Pap  2020; needs pp         Obesity affecting pregnancy 06/13/2020 by Will Bonnet, MD No   Herpes virus infection during pregnancy 06/13/2020  by Conard Novak, MD No   Overview Signed 06/13/2020  3:40 PM by Conard Novak, MD    [ ]  ppx at 36 wks          Term labor symptoms and general obstetric precautions including but not limited to vaginal bleeding, contractions, leaking of fluid and fetal movement  were reviewed in detail with the patient. Please refer to After Visit Summary for other counseling recommendations.   Return in about 1 week (around 01/25/2021) for rob/nst.  01/27/2021, CNM 01/18/2021 11:33 AM

## 2021-01-20 ENCOUNTER — Other Ambulatory Visit: Payer: Self-pay

## 2021-01-20 ENCOUNTER — Ambulatory Visit (INDEPENDENT_AMBULATORY_CARE_PROVIDER_SITE_OTHER): Payer: BC Managed Care – PPO | Admitting: Obstetrics

## 2021-01-20 VITALS — BP 98/70 | Wt 180.0 lb

## 2021-01-20 DIAGNOSIS — O0990 Supervision of high risk pregnancy, unspecified, unspecified trimester: Secondary | ICD-10-CM

## 2021-01-20 DIAGNOSIS — Z3A38 38 weeks gestation of pregnancy: Secondary | ICD-10-CM

## 2021-01-20 LAB — POCT URINALYSIS DIPSTICK OB: Glucose, UA: NEGATIVE

## 2021-01-20 NOTE — Addendum Note (Signed)
Addended by: Donnetta Hail on: 01/20/2021 04:52 PM   Modules accepted: Orders

## 2021-01-20 NOTE — Progress Notes (Signed)
ROB- cervix check 

## 2021-01-20 NOTE — Progress Notes (Signed)
Routine Prenatal Care Visit  Subjective  Kari Frost is a 35 y.o. [id number] at [redacted]w[redacted]d being seen today for ongoing prenatal care.  She is currently monitored for the following issues for this low-risk pregnancy and has Thyroid enlargement; Displacement of intrauterine contraceptive device; Current severe episode of major depressive disorder without psychotic features without prior episode (HCC); Class 1 obesity due to excess calories without serious comorbidity with body mass index (BMI) of 30.0 to 30.9 in adult; Pure hypercholesterolemia; Mood disorder (HCC); Genital herpes simplex type 2; Supervision of high risk pregnancy, antepartum; Obesity affecting pregnancy; Herpes virus infection during pregnancy; and Nausea and vomiting during pregnancy on their problem list.  ----------------------------------------------------------------------------------- Patient reports no complaints.    .  .   Kari Frost denies.  ----------------------------------------------------------------------------------- The following portions of the patient's history were reviewed and updated as appropriate: allergies, current medications, past family history, past medical history, past social history, past surgical history and problem list. Problem list updated.  Objective  Blood pressure 98/70, weight 180 lb (81.6 kg), last menstrual period 03/07/2020, unknown if currently breastfeeding. Pregravid weight 185 lb (83.9 kg) Total Weight Gain -5 lb (-2.268 kg) Urinalysis: Urine Protein    Urine Glucose    Fetal Status:           General:  Alert, oriented and cooperative. Patient is in no acute distress.  Skin: Skin is warm and dry. No rash noted.   Cardiovascular: Normal heart rate noted  Respiratory: Normal respiratory effort, no problems with respiration noted  Abdomen: Soft, gravid, appropriate for gestational age.       Pelvic:  Cervical exam performed      1.5cms/still thick but softer/-3  Extremities:  Normal range of motion.     Mental Status: Normal mood and affect. Normal behavior. Normal judgment and thought content.   Assessment   35 y.o. [id number] at [redacted]w[redacted]d by  01/23/2021, by Ultrasound presenting for routine prenatal visit  Plan   MAY 2022 Problems (from 06/13/20 to present)    Problem Noted Resolved   Nausea and vomiting during pregnancy 06/15/2020 by Conard Novak, MD No   Supervision of high risk pregnancy, antepartum 06/13/2020 by Conard Novak, MD No   Overview Addendum 01/10/2021  4:45 PM by Mirna Mires, CNM     Nursing Staff Provider  Office Location  [facility] Dating  9 week [place]  Language  English Anatomy US  Normal female  Flu Vaccine  decl Genetic Screen  NIPS: declined  TDaP vaccine   12/02/20 Hgb A1C or  GTT HgbA1C:4.9   Third trimester : 44  Covid    LAB RESULTS   Rhogam  n/a Blood Type   A+  Feeding Plan Breast Antibody  neg  Contraception BTL Rubella  immune  Circumcision n/a RPR   neg  Pediatrician   HBsAg   neg  Support Person  HIV  neg  Prenatal Classes  Varicella immune    GBS  (For PCN allergy, check sensitivities) negative  BTL Consent N/a, private ins    VBAC Consent no Pap  2020; needs pp         Obesity affecting pregnancy 06/13/2020 by Conard Novak, MD No   Herpes virus infection during pregnancy 06/13/2020 by Conard Novak, MD No   Overview Signed 06/13/2020  3:40 PM by Conard Novak, MD    [ ]  ppx at 36 wks          Term labor symptoms and  general obstetric precautions including but not limited to vaginal bleeding, contractions, leaking of Frost and fetal movement were reviewed in detail with the patient. Please refer to After Visit Summary for other counseling recommendations.  She still wants a BTL, and is aware that she may have an interval tubal depending upon when she delivers.  No follow-ups on file.  Mirna Mires, CNM  01/20/2021 4:48 PM

## 2021-01-24 ENCOUNTER — Observation Stay (HOSPITAL_BASED_OUTPATIENT_CLINIC_OR_DEPARTMENT_OTHER)
Admission: EM | Admit: 2021-01-24 | Discharge: 2021-01-24 | Disposition: A | Discharge status: Home / Self Care | Payer: BC Managed Care – PPO | Source: Home / Self Care | Admitting: Obstetrics and Gynecology

## 2021-01-24 DIAGNOSIS — Z3A40 40 weeks gestation of pregnancy: Secondary | ICD-10-CM

## 2021-01-24 DIAGNOSIS — R109 Unspecified abdominal pain: Secondary | ICD-10-CM | POA: Diagnosis not present

## 2021-01-24 DIAGNOSIS — O480 Post-term pregnancy: Secondary | ICD-10-CM | POA: Insufficient documentation

## 2021-01-24 DIAGNOSIS — O26893 Other specified pregnancy related conditions, third trimester: Secondary | ICD-10-CM

## 2021-01-24 DIAGNOSIS — O479 False labor, unspecified: Secondary | ICD-10-CM | POA: Diagnosis present

## 2021-01-24 DIAGNOSIS — O4703 False labor before 37 completed weeks of gestation, third trimester: Secondary | ICD-10-CM | POA: Insufficient documentation

## 2021-01-24 LAB — URINALYSIS, COMPLETE (UACMP) WITH MICROSCOPIC
Bacteria, UA: NONE SEEN
Bilirubin Urine: NEGATIVE
Glucose, UA: NEGATIVE mg/dL
Nitrite: NEGATIVE
Protein, ur: NEGATIVE mg/dL
Specific Gravity, Urine: >1.03 — ABNORMAL HIGH (ref 1.005–1.030)
pH: 5.5 (ref 5.0–8.0)

## 2021-01-24 NOTE — Discharge Summary (Signed)
  See FPN 

## 2021-01-24 NOTE — OB Triage Note (Signed)
Presents with complaints of contractions that started last night. Denies bleeding or LOF. Denies any other symptoms of UTI. EFM applied.

## 2021-01-24 NOTE — Final Progress Note (Signed)
Physician Final Progress Note  Patient ID: Kari Frost MRN: [id number] DOB/AGE: 1985-10-18 35 y.o.  Admit date: 01/24/2021 Admitting provider: Natale Milch, MD Discharge date: 01/24/2021  Admission Diagnoses: Principal Problem:   Uterine contractions   40 weeks pregnancy  Discharge Diagnoses:  Principal Problem:   Uterine contractions  40 weeks pregnancy  Consults: None  Significant Findings/ Diagnostic Studies:  Obstetrics Admission History & Physical   Contractions   HPI:  35 y.o. [id number] @ [redacted]w[redacted]d (01/23/2021, by Ultrasound). Admitted on 01/24/2021:   Patient Active Problem List   Diagnosis Date Noted   Uterine contractions 01/24/2021   Nausea and vomiting during pregnancy 06/15/2020   Supervision of high risk pregnancy, antepartum 06/13/2020   Obesity affecting pregnancy 06/13/2020   Herpes virus infection during pregnancy 06/13/2020   Mood disorder (HCC) 06/27/2018   Current severe episode of major depressive disorder without psychotic features without prior episode (HCC) 10/14/2017   Class 1 obesity due to excess calories without serious comorbidity with body mass index (BMI) of 30.0 to 30.9 in adult 10/14/2017   Pure hypercholesterolemia 10/14/2017   Displacement of intrauterine contraceptive device 04/10/2016   Thyroid enlargement 11/30/2014   Genital herpes simplex type 2 10/12/2014     Presents for pain in right side, also having mild ctxs pains.  No VB or ROM.   Prenatal care at: at [facility]. Pregnancy complicated by none.  ROS: A review of systems was performed and negative, except as stated in the above HPI. PMHx:  Past Medical History:  Diagnosis Date   Genital herpes    Thyroid disease    UTI (urinary tract infection)    PSHx:  Past Surgical History:  Procedure Laterality Date   INTRAUTERINE DEVICE (IUD) INSERTION  2018   INTRAUTERINE DEVICE (IUD) INSERTION N/A 04/26/2016   Procedure: INTRAUTERINE DEVICE (IUD) INSERTION;  Surgeon:  Conard Novak, MD;  Location: [facility] ORS;  Service: Gynecology;  Laterality: N/A;  lot tu01sce exp 09/012020 mirena   IUD REMOVAL N/A 04/26/2016   Procedure: INTRAUTERINE DEVICE (IUD) REMOVAL;  Surgeon: Conard Novak, MD;  Location: [facility] ORS;  Service: Gynecology;  Laterality: N/A;   LAPAROSCOPY N/A 04/26/2016   Procedure: LAPAROSCOPY OPERATIVE;  Surgeon: Conard Novak, MD;  Location: [facility] ORS;  Service: Gynecology;  Laterality: N/A;   nexplanon  2020   Medications:  Medications Prior to Admission  Medication Sig Dispense Refill Last Dose   valACYclovir (VALTREX) 500 MG tablet Take 1 tablet (500 mg total) by mouth 2 (two) times daily. 60 tablet 6 Unknown   Allergies: has No Known Allergies. OBHx:  OB History  Gravida Para Term Preterm AB Living  4 2 2   1 2   SAB IAB Ectopic Multiple Live Births  1       2    # Outcome Date GA Lbr Len/2nd Weight Sex Delivery Anes PTL Lv  4 Current           3 SAB           2 Term           1 Term            except as detailed in HPI.MWN:UUVOZDGU/[id number]  No family history of birth defects. Soc Hx: Alcohol: none and Recreational drug use: none  Objective:   Vitals:   01/24/21 0717  Resp: 16  Temp: 99 F (37.2 C)   Constitutional: Well nourished, well developed female in no acute distress.  HEENT: normal Skin: Warm  and dry.  Cardiovascular:Regular rate and rhythm.   Extremity: trace to 1+ bilateral pedal edema Respiratory: Clear to auscultation bilateral. Normal respiratory effort Abdomen: gravid, ND, FHT present, mild tenderness on exam Back: no CVAT Neuro: DTRs 2+, Cranial nerves grossly intact Psych: Alert and Oriented x3. No memory deficits. Normal mood and affect.  MS: normal gait, normal bilateral lower extremity ROM/strength/stability.  Pelvic exam: is not limited by body habitus EGBUS: within normal limits Vagina: within normal limits and with normal mucosa Cervix: 1-2/50/-3 Uterus: Uterine contractions of  Mild (palpable = 47mm Hg). intensity.  Adnexa: not evaluated  EFM:FHR: 140 bpm, variability: moderate,  accelerations:  Present,  decelerations:  Absent Toco: Frequency: Every 4-6 minutes  Assessment & Plan:   35 y.o. [id number] @ [redacted]w[redacted]d, Admitted on 01/24/2021:Early latent labor vs bracton hicks     Procedures: A NST procedure was performed with FHR monitoring and a normal baseline established, appropriate time of 20-40 minutes of evaluation, and accels >2 seen w 15x15 characteristics.  Results show a REACTIVE NST.   PLAN: Ambulate, monitor s/sx labor, follow up as it intensifies IOL planned for 12/29 [facility]   Discharge Condition: good  Disposition: Discharge disposition: 01-Home or Self Care       Diet: Regular diet  Discharge Activity: Activity as tolerated  Discharge Instructions     Call MD for:   Complete by: As directed    Worsening contractions or pain; leakage of fluid; bleeding.   Diet - low sodium heart healthy   Complete by: As directed    Increase activity slowly   Complete by: As directed       Allergies as of 01/24/2021   No Known Allergies      Medication List     TAKE these medications    valACYclovir 500 MG tablet Commonly known as: VALTREX Take 1 tablet (500 mg total) by mouth 2 (two) times daily.         Total time spent taking care of this patient: 30 minutes  Signed: Letitia Libra 01/24/2021, 11:13 AM

## 2021-01-27 ENCOUNTER — Encounter: Payer: BC Managed Care – PPO | Admitting: Advanced Practice Midwife

## 2021-01-27 ENCOUNTER — Other Ambulatory Visit: Payer: Self-pay

## 2021-01-27 ENCOUNTER — Encounter: Payer: Self-pay | Admitting: Obstetrics and Gynecology

## 2021-01-27 ENCOUNTER — Inpatient Hospital Stay
Admission: EM | Admit: 2021-01-27 | Discharge: 2021-01-28 | DRG: 806 | Disposition: A | Discharge status: Home / Self Care | Payer: BC Managed Care – PPO | Attending: Obstetrics and Gynecology | Admitting: Obstetrics and Gynecology

## 2021-01-27 ENCOUNTER — Inpatient Hospital Stay: Payer: BC Managed Care – PPO | Admitting: Anesthesiology

## 2021-01-27 DIAGNOSIS — Z20822 Contact with and (suspected) exposure to covid-19: Secondary | ICD-10-CM | POA: Diagnosis present

## 2021-01-27 DIAGNOSIS — O480 Post-term pregnancy: Principal | ICD-10-CM

## 2021-01-27 DIAGNOSIS — B009 Herpesviral infection, unspecified: Secondary | ICD-10-CM

## 2021-01-27 DIAGNOSIS — Z3A40 40 weeks gestation of pregnancy: Secondary | ICD-10-CM | POA: Diagnosis not present

## 2021-01-27 DIAGNOSIS — O9832 Other infections with a predominantly sexual mode of transmission complicating childbirth: Secondary | ICD-10-CM | POA: Diagnosis present

## 2021-01-27 DIAGNOSIS — O99214 Obesity complicating childbirth: Secondary | ICD-10-CM | POA: Diagnosis present

## 2021-01-27 DIAGNOSIS — O219 Vomiting of pregnancy, unspecified: Secondary | ICD-10-CM

## 2021-01-27 DIAGNOSIS — Z3490 Encounter for supervision of normal pregnancy, unspecified, unspecified trimester: Secondary | ICD-10-CM | POA: Diagnosis present

## 2021-01-27 DIAGNOSIS — E6609 Other obesity due to excess calories: Secondary | ICD-10-CM | POA: Diagnosis present

## 2021-01-27 DIAGNOSIS — O99213 Obesity complicating pregnancy, third trimester: Secondary | ICD-10-CM

## 2021-01-27 DIAGNOSIS — O0990 Supervision of high risk pregnancy, unspecified, unspecified trimester: Secondary | ICD-10-CM

## 2021-01-27 DIAGNOSIS — A6000 Herpesviral infection of urogenital system, unspecified: Secondary | ICD-10-CM | POA: Diagnosis present

## 2021-01-27 LAB — CBC
HCT: 36.1 % (ref 36.0–46.0)
Hemoglobin: 12.4 g/dL (ref 12.0–15.0)
MCH: 31.7 pg (ref 26.0–34.0)
MCHC: 34.3 g/dL (ref 30.0–36.0)
MCV: 92.3 fL (ref 80.0–100.0)
Platelets: 266 10*3/uL (ref 150–400)
RBC: 3.91 MIL/uL (ref 3.87–5.11)
RDW: 13 % (ref 11.5–15.5)
WBC: 10.3 10*3/uL (ref 4.0–10.5)
nRBC: 0 % (ref 0.0–0.2)

## 2021-01-27 LAB — TYPE AND SCREEN
ABO/RH(D): A POS
Antibody Screen: NEGATIVE

## 2021-01-27 LAB — SYPHILIS: RPR W/REFLEX TO RPR TITER AND TREPONEMAL ANTIBODIES, TRADITIONAL SCREENING AND DIAGNOSIS ALGORITHM: RPR Ser Ql: NONREACTIVE

## 2021-01-27 LAB — RESP PANEL BY RT-PCR (FLU A&B, COVID) ARPGX2
Influenza A by PCR: NEGATIVE
Influenza B by PCR: NEGATIVE
SARS Coronavirus 2 by RT PCR: NEGATIVE

## 2021-01-27 MED ORDER — LACTATED RINGERS IV SOLN: 500.0000 mL | INTRAVENOUS | Status: DC | PRN

## 2021-01-27 MED ORDER — SIMETHICONE 80 MG PO CHEW: 80.0000 mg | CHEWABLE_TABLET | ORAL | Status: DC | PRN

## 2021-01-27 MED ORDER — LIDOCAINE-EPINEPHRINE (PF) 1.5 %-1:200000 IJ SOLN
INTRAMUSCULAR | Status: DC | PRN
  Administered 2021-01-27: 3 mL via EPIDURAL

## 2021-01-27 MED ORDER — ONDANSETRON HCL 4 MG/2ML IJ SOLN: 4.0000 mg | INTRAMUSCULAR | Status: DC | PRN

## 2021-01-27 MED ORDER — OXYTOCIN BOLUS FROM INFUSION
333.0000 mL | Freq: Once | INTRAVENOUS | Status: AC
  Administered 2021-01-27: 11:00:00 333 mL via INTRAVENOUS

## 2021-01-27 MED ORDER — WITCH HAZEL-GLYCERIN EX PADS: 1.0000 "application " | MEDICATED_PAD | CUTANEOUS | Status: DC | PRN

## 2021-01-27 MED ORDER — ONDANSETRON HCL 4 MG PO TABS: 4.0000 mg | ORAL_TABLET | ORAL | Status: DC | PRN

## 2021-01-27 MED ORDER — OXYTOCIN-SODIUM CHLORIDE 30-0.9 UT/500ML-% IV SOLN
1.0000 m[IU]/min | INTRAVENOUS | Status: DC
  Administered 2021-01-27: 02:00:00 1 m[IU]/min via INTRAVENOUS
  Filled 2021-01-27: qty 500

## 2021-01-27 MED ORDER — OXYCODONE HCL 5 MG PO TABS: 10.0000 mg | ORAL_TABLET | ORAL | Status: DC | PRN

## 2021-01-27 MED ORDER — OXYTOCIN 10 UNIT/ML IJ SOLN
INTRAMUSCULAR | Status: AC
  Filled 2021-01-27: qty 2

## 2021-01-27 MED ORDER — IBUPROFEN 600 MG PO TABS
ORAL_TABLET | ORAL | Status: AC
  Administered 2021-01-27: 12:00:00 600 mg via ORAL
  Filled 2021-01-27: qty 1

## 2021-01-27 MED ORDER — DIPHENHYDRAMINE HCL 25 MG PO CAPS: 25.0000 mg | ORAL_CAPSULE | Freq: Four times a day (QID) | ORAL | Status: DC | PRN

## 2021-01-27 MED ORDER — FENTANYL-BUPIVACAINE-NACL 0.5-0.125-0.9 MG/250ML-% EP SOLN
EPIDURAL | Status: DC | PRN
  Administered 2021-01-27: 12 mL/h via EPIDURAL

## 2021-01-27 MED ORDER — SOD CITRATE-CITRIC ACID 500-334 MG/5ML PO SOLN
30.0000 mL | ORAL | Status: DC | PRN
  Administered 2021-01-27: 08:00:00 30 mL via ORAL

## 2021-01-27 MED ORDER — LIDOCAINE HCL (PF) 1 % IJ SOLN
INTRAMUSCULAR | Status: DC | PRN
  Administered 2021-01-27: 2 mL via SUBCUTANEOUS

## 2021-01-27 MED ORDER — SODIUM CHLORIDE 0.9 % IV SOLN
INTRAVENOUS | Status: DC | PRN
  Administered 2021-01-27 (×2): 5 mL via EPIDURAL

## 2021-01-27 MED ORDER — TETANUS-DIPHTH-ACELL PERTUSSIS 5-2.5-18.5 LF-MCG/0.5 IM SUSY
0.5000 mL | PREFILLED_SYRINGE | Freq: Once | INTRAMUSCULAR | Status: DC
  Filled 2021-01-27: qty 0.5

## 2021-01-27 MED ORDER — BENZOCAINE-MENTHOL 20-0.5 % EX AERO
INHALATION_SPRAY | CUTANEOUS | Status: AC
  Filled 2021-01-27: qty 56

## 2021-01-27 MED ORDER — OXYTOCIN-SODIUM CHLORIDE 30-0.9 UT/500ML-% IV SOLN
2.5000 [IU]/h | INTRAVENOUS | Status: DC
  Administered 2021-01-27: 11:00:00 2.5 [IU]/h via INTRAVENOUS
  Filled 2021-01-27: qty 500

## 2021-01-27 MED ORDER — DOCUSATE SODIUM 100 MG PO CAPS
100.0000 mg | ORAL_CAPSULE | Freq: Two times a day (BID) | ORAL | Status: DC
  Administered 2021-01-28: 100 mg via ORAL
  Filled 2021-01-27: qty 1

## 2021-01-27 MED ORDER — PRENATAL MULTIVITAMIN CH
1.0000 | ORAL_TABLET | Freq: Every day | ORAL | Status: DC
  Administered 2021-01-28: 1 via ORAL
  Filled 2021-01-27: qty 1

## 2021-01-27 MED ORDER — MISOPROSTOL 200 MCG PO TABS
ORAL_TABLET | ORAL | Status: AC
  Filled 2021-01-27: qty 4

## 2021-01-27 MED ORDER — COCONUT OIL OIL
1.0000 "application " | TOPICAL_OIL | Status: DC | PRN
  Administered 2021-01-27: 1 via TOPICAL
  Filled 2021-01-27: qty 120

## 2021-01-27 MED ORDER — FENTANYL-BUPIVACAINE-NACL 0.5-0.125-0.9 MG/250ML-% EP SOLN
EPIDURAL | Status: AC
  Filled 2021-01-27: qty 250

## 2021-01-27 MED ORDER — ONDANSETRON HCL 4 MG/2ML IJ SOLN
4.0000 mg | Freq: Four times a day (QID) | INTRAMUSCULAR | Status: DC | PRN
  Administered 2021-01-27: 04:00:00 4 mg via INTRAVENOUS
  Filled 2021-01-27: qty 2

## 2021-01-27 MED ORDER — TERBUTALINE SULFATE 1 MG/ML IJ SOLN: 0.2500 mg | Freq: Once | INTRAMUSCULAR | Status: DC | PRN

## 2021-01-27 MED ORDER — LIDOCAINE HCL (PF) 1 % IJ SOLN: 30.0000 mL | INTRAMUSCULAR | Status: DC | PRN

## 2021-01-27 MED ORDER — ACETAMINOPHEN 325 MG PO TABS: 650.0000 mg | ORAL_TABLET | ORAL | Status: DC | PRN

## 2021-01-27 MED ORDER — IBUPROFEN 600 MG PO TABS
600.0000 mg | ORAL_TABLET | Freq: Four times a day (QID) | ORAL | Status: DC
  Administered 2021-01-27 – 2021-01-28 (×4): 600 mg via ORAL
  Filled 2021-01-27 (×4): qty 1

## 2021-01-27 MED ORDER — OXYCODONE HCL 5 MG PO TABS: 5.0000 mg | ORAL_TABLET | ORAL | Status: DC | PRN

## 2021-01-27 MED ORDER — BUTORPHANOL TARTRATE 1 MG/ML IJ SOLN
1.0000 mg | INTRAMUSCULAR | Status: DC | PRN
  Administered 2021-01-27: 04:00:00 1 mg via INTRAVENOUS
  Filled 2021-01-27: qty 1

## 2021-01-27 MED ORDER — LIDOCAINE HCL (PF) 1 % IJ SOLN
INTRAMUSCULAR | Status: AC
  Filled 2021-01-27: qty 30

## 2021-01-27 MED ORDER — PHENYLEPHRINE 40 MCG/ML (10ML) SYRINGE FOR IV PUSH (FOR BLOOD PRESSURE SUPPORT): 80.0000 ug | PREFILLED_SYRINGE | INTRAVENOUS | Status: DC | PRN

## 2021-01-27 MED ORDER — ACETAMINOPHEN 325 MG PO TABS
650.0000 mg | ORAL_TABLET | ORAL | Status: DC | PRN
  Administered 2021-01-27 – 2021-01-28 (×3): 650 mg via ORAL
  Filled 2021-01-27 (×3): qty 2

## 2021-01-27 MED ORDER — EPHEDRINE 5 MG/ML INJ: 10.0000 mg | INTRAVENOUS | Status: DC | PRN

## 2021-01-27 MED ORDER — ZOLPIDEM TARTRATE 5 MG PO TABS: 5.0000 mg | ORAL_TABLET | Freq: Every evening | ORAL | Status: DC | PRN

## 2021-01-27 MED ORDER — AMMONIA AROMATIC IN INHA
RESPIRATORY_TRACT | Status: AC
  Filled 2021-01-27: qty 10

## 2021-01-27 MED ORDER — FENTANYL-BUPIVACAINE-NACL 0.5-0.125-0.9 MG/250ML-% EP SOLN: 12.0000 mL/h | EPIDURAL | Status: DC | PRN

## 2021-01-27 MED ORDER — DIBUCAINE (PERIANAL) 1 % EX OINT: 1.0000 "application " | TOPICAL_OINTMENT | CUTANEOUS | Status: DC | PRN

## 2021-01-27 MED ORDER — DIPHENHYDRAMINE HCL 50 MG/ML IJ SOLN: 12.5000 mg | INTRAMUSCULAR | Status: DC | PRN

## 2021-01-27 MED ORDER — LACTATED RINGERS IV SOLN
500.0000 mL | Freq: Once | INTRAVENOUS | Status: AC
  Administered 2021-01-27: 06:00:00 500 mL via INTRAVENOUS

## 2021-01-27 MED ORDER — BENZOCAINE-MENTHOL 20-0.5 % EX AERO
1.0000 "application " | INHALATION_SPRAY | CUTANEOUS | Status: DC | PRN
  Administered 2021-01-27: 1 via TOPICAL

## 2021-01-27 MED ORDER — LACTATED RINGERS IV SOLN: INTRAVENOUS | Status: DC

## 2021-01-27 NOTE — Lactation Note (Signed)
This note was copied from a baby's chart. Lactation Consultation Note  Patient Name: Kari Frost XAJOI'N Date: 01/27/2021 Reason for consult: Initial assessment;Term Age:33 hours  Maternal Data Does the patient have breastfeeding experience prior to this delivery?: Yes How long did the patient breastfeed?: 1 mth  Feeding Mother's Current Feeding Choice: Breast Milk I did not observe a feeding, but mom states baby had just recently fed well x 40 min on left breast, encouraged mom to offer both breasts each feed, mom states she heard lots of swallows LATCH Score Latch:  (did not observe a feeding)                  Lactation Tools Discussed/Used  Lc name and no written on white board  Interventions Interventions: Breast feeding basics reviewed;Comfort gels;Education  Discharge Pump: Personal WIC Program: No  Consult Status Consult Status: PRN    Dyann Kief 01/27/2021, 6:05 PM

## 2021-01-27 NOTE — Discharge Summary (Signed)
Postpartum Discharge Summary  Date of Service updated12/30/2022     Patient Name: Kari Frost DOB: February 05, 1985 MRN: [id number]  Date of admission: 01/27/2021 Delivery date:01/27/2021  Delivering provider: Imagene Riches  Date of discharge: 01/28/2021  Admitting diagnosis: Encounter for elective induction of labor [Z34.90] Intrauterine pregnancy: [redacted]w[redacted]d    Secondary diagnosis:  Principal Problem:   Encounter for elective induction of labor Active Problems:   Postpartum care following vaginal delivery  Additional problems: none. For Postpartum BTL on 12/31    Discharge diagnosis: Term Pregnancy Delivered                                              Post partum procedures:postpartum tubal ligation Augmentation: AROM and Pitocin Complications: None  Hospital course: Onset of Labor With Vaginal Delivery      35y.o. yo [id number] admitted in Latent Labor on 01/27/2021. Patient had an uncomplicated labor course as follows:  Membrane Rupture Time/Date: 10:41 AM ,01/27/2021   Delivery Method:Vaginal, Spontaneous  Episiotomy: None  Lacerations:  1st degree  Patient had an uncomplicated postpartum course.  She is ambulating, tolerating a regular diet, passing flatus, and urinating well. Patient is discharged home in stable condition on 01/28/21.  Newborn Data: Birth date:01/27/2021  Birth time:10:51 AM  Gender:Female  Living status:Living  Apgars:9 ,9  Weight:3510 g   Magnesium Sulfate received: No BMZ received: No Rhophylac:N/A MMR:No T-DaP:Given prenatally Flu: Yes Transfusion:No  Physical exam  Vitals:   01/27/21 1946 01/27/21 2312 01/28/21 0314 01/28/21 0830  BP: 100/68 98/69 (!) 93/59 109/62  Pulse: 75 74 74 73  Resp: 18 18 18 18   Temp: 98.6 F (37 C) 98.5 F (36.9 C) 98.5 F (36.9 C) 98 F (36.7 C)  TempSrc: Oral Oral Oral Oral  SpO2: 98% 99% 97% 98%  Weight:      Height:       General: alert, cooperative, and no distress Lochia:  appropriate DVT Evaluation: No evidence of DVT seen on physical exam. Labs: Lab Results  Component Value Date   WBC 9.8 01/28/2021   HGB 10.1 (L) 01/28/2021   HCT 30.1 (L) 01/28/2021   MCV 93.5 01/28/2021   PLT 227 01/28/2021   CMP Latest Ref Rng & Units 11/08/2020  Glucose 70 - 99 mg/dL 83  BUN 6 - 20 mg/dL 7  Creatinine 0.44 - 1.00 mg/dL 0.42(L)  Sodium 135 - 145 mmol/L 134(L)  Potassium 3.5 - 5.1 mmol/L 3.8  Chloride 98 - 111 mmol/L 104  CO2 22 - 32 mmol/L 22  Calcium 8.9 - 10.3 mg/dL 8.6(L)  Total Protein 6.5 - 8.1 g/dL 6.4(L)  Total Bilirubin 0.3 - 1.2 mg/dL 0.8  Alkaline Phos 38 - 126 U/L 69  AST 15 - 41 U/L 13(L)  ALT 0 - 44 U/L 10   Edinburgh Score: Edinburgh Postnatal Depression Scale Screening Tool 01/27/2021  I have been able to laugh and see the funny side of things. 0  I have looked forward with enjoyment to things. 0  I have blamed myself unnecessarily when things went wrong. 1  I have been anxious or worried for no good reason. 1  I have felt scared or panicky for no good reason. 0  Things have been getting on top of me. 0  I have been so unhappy that I have had difficulty  sleeping. 0  I have felt sad or miserable. 0  I have been so unhappy that I have been crying. 1  The thought of harming myself has occurred to me. 0  Edinburgh Postnatal Depression Scale Total 3      After visit meds:  Allergies as of 01/28/2021   No Known Allergies      Medication List     STOP taking these medications    valACYclovir 500 MG tablet Commonly known as: VALTREX       TAKE these medications    ibuprofen 600 MG tablet Commonly known as: ADVIL Take 1 tablet (600 mg total) by mouth every 6 (six) hours.         Discharge home in stable condition Infant Feeding: Breast Infant Disposition:home with mother Discharge instruction: per After Visit Summary and Postpartum booklet. Activity: Advance as tolerated. Pelvic rest for 6 weeks.  Diet: routine  diet Anticipated Birth Control: BTL done PP Postpartum Appointment:6 weeks Additional Postpartum F/U:  none Future Appointments:No future appointments. Follow up Visit:  Follow-up Information     Schuman, Christanna R, MD Follow up in 4 week(s).   Specialty: Obstetrics and Gynecology Why: postpartum and BTL consent Contact information: [address] [place] [address] [phone]                     01/28/2021 Malachy Mood, MD

## 2021-01-27 NOTE — Anesthesia Preprocedure Evaluation (Addendum)
Anesthesia Evaluation  Patient identified by MRN, date of birth, ID band Patient awake    Reviewed: Allergy & Precautions, NPO status , Patient's Chart, lab work & pertinent test results  History of Anesthesia Complications Negative for: history of anesthetic complications  Airway Mallampati: II       Dental no notable dental hx.    Pulmonary neg pulmonary ROS,           Cardiovascular negative cardio ROS       Neuro/Psych PSYCHIATRIC DISORDERS Depression negative neurological ROS     GI/Hepatic negative GI ROS, Neg liver ROS,   Endo/Other  negative endocrine ROS  Renal/GU negative Renal ROS     Musculoskeletal   Abdominal   Peds  Hematology negative hematology ROS (+)   Anesthesia Other Findings Past Medical History: No date: Genital herpes No date: Thyroid disease No date: UTI (urinary tract infection)   Reproductive/Obstetrics (+) Pregnancy                             Anesthesia Physical  Anesthesia Plan  ASA: 2  Anesthesia Plan: Epidural   Post-op Pain Management:    Induction:   PONV Risk Score and Plan:   Airway Management Planned:   Additional Equipment:   Intra-op Plan:   Post-operative Plan:   Informed Consent: I have reviewed the patients History and Physical, chart, labs and discussed the procedure including the risks, benefits and alternatives for the proposed anesthesia with the patient or authorized representative who has indicated his/her understanding and acceptance.       Plan Discussed with:   Anesthesia Plan Comments:        Anesthesia Quick Evaluation

## 2021-01-27 NOTE — Anesthesia Procedure Notes (Signed)
Epidural Patient location during procedure: OB Start time: 01/27/2021 6:08 AM End time: 01/27/2021 6:12 AM  Staffing Anesthesiologist: Lenard Simmer, MD Performed: anesthesiologist   Preanesthetic Checklist Completed: patient identified, IV checked, site marked, risks and benefits discussed, surgical consent, monitors and equipment checked, pre-op evaluation and timeout performed  Epidural Patient position: sitting Prep: ChloraPrep Patient monitoring: heart rate, continuous pulse ox and blood pressure Approach: midline Location: L3-L4 Injection technique: LOR saline  Needle:  Needle type: Tuohy  Needle gauge: 17 G Needle length: 9 cm and 9 Needle insertion depth: 5 cm Catheter type: closed end flexible Catheter size: 19 Gauge Catheter at skin depth: 10 cm Test dose: negative and 1.5% lidocaine with Epi 1:200 K  Assessment Sensory level: T10 Events: blood not aspirated, injection not painful, no injection resistance, no paresthesia and negative IV test  Additional Notes 1st attempt Pt. Evaluated and documentation done after procedure finished. Patient identified. Risks/Benefits/Options discussed with patient including but not limited to bleeding, infection, nerve damage, paralysis, failed block, incomplete pain control, headache, blood pressure changes, nausea, vomiting, reactions to medication both or allergic, itching and postpartum back pain. Confirmed with bedside nurse the patient's most recent platelet count. Confirmed with patient that they are not currently taking any anticoagulation, have any bleeding history or any family history of bleeding disorders. Patient expressed understanding and wished to proceed. All questions were answered. Sterile technique was used throughout the entire procedure. Please see nursing notes for vital signs. Test dose was given through epidural catheter and negative prior to continuing to dose epidural or start infusion. Warning signs of high  block given to the patient including shortness of breath, tingling/numbness in hands, complete motor block, or any concerning symptoms with instructions to call for help. Patient was given instructions on fall risk and not to get out of bed. All questions and concerns addressed with instructions to call with any issues or inadequate analgesia.    Patient tolerated the insertion well without immediate complications.Reason for block:procedure for pain

## 2021-01-27 NOTE — H&P (Signed)
Obstetric H&P   Chief Complaint: IOL  Prenatal Care Provider: [facility]  History of Present Illness: 35 y.o. [id number] [redacted]w[redacted]d by 01/23/2021, by Ultrasound presenting to L&D for scheduled IOL.  +FM, no LOF, irregular contractions not as strong as Tuesday, no VB.  Pregnancy uncomplicated.  BTL consent signed 01/04/2021, patient does not need to wait on family planning medicaid procedures to mature prior to BTL given state health blue cross blue shield  Pregravid weight 83.9 kg Total Weight Gain -2.268 kg  MAY 2022 Problems (from 06/13/20 to present)     Problem Noted Resolved   Nausea and vomiting during pregnancy 06/15/2020 by Conard Novak, MD No   Supervision of high risk pregnancy, antepartum 06/13/2020 by Conard Novak, MD No   Overview Addendum 01/10/2021  4:45 PM by Mirna Mires, CNM     Nursing Staff Provider  Office Location  [facility] Dating  9 week [place]  Language  English Anatomy US  Normal female  Flu Vaccine  decl Genetic Screen  NIPS: declined  TDaP vaccine   12/02/20 Hgb A1C or  GTT HgbA1C:4.9   Third trimester : 30  Covid    LAB RESULTS   Rhogam  n/a Blood Type   A+  Feeding Plan Breast Antibody  neg  Contraception BTL Rubella  immune  Circumcision n/a RPR   neg  Pediatrician   HBsAg   neg  Support Person  HIV  neg  Prenatal Classes  Varicella immune    GBS  (For PCN allergy, check sensitivities) negative  BTL Consent N/a, private ins    VBAC Consent no Pap  2020; needs pp         Obesity affecting pregnancy 06/13/2020 by Conard Novak, MD No   Herpes virus infection during pregnancy 06/13/2020 by Conard Novak, MD No   Overview Signed 06/13/2020  3:40 PM by Conard Novak, MD    [ ]  ppx at 36 wks           Review of Systems: 10 point review of systems negative unless otherwise noted in HPI  Past Medical History: Patient Active Problem List   Diagnosis Date Noted   Encounter for elective induction of labor 01/27/2021   Uterine  contractions 01/24/2021   Nausea and vomiting during pregnancy 06/15/2020   Supervision of high risk pregnancy, antepartum 06/13/2020     Nursing Staff Provider  Office Location  [facility] Dating  9 week 06/15/2020  Language  English Anatomy US  Normal female  Flu Vaccine  decl Genetic Screen  NIPS: declined  TDaP vaccine   12/02/20 Hgb A1C or  GTT HgbA1C:4.9   Third trimester : 53  Covid    LAB RESULTS   Rhogam  n/a Blood Type   A+  Feeding Plan Breast Antibody  neg  Contraception BTL Rubella  immune  Circumcision n/a RPR   neg  Pediatrician   HBsAg   neg  Support Person  HIV  neg  Prenatal Classes  Varicella immune    GBS  (For PCN allergy, check sensitivities) negative  BTL Consent N/a, private ins    VBAC Consent no Pap  2020; needs pp       Obesity affecting pregnancy 06/13/2020   Herpes virus infection during pregnancy 06/13/2020    [ ]  ppx at 36 wks    Mood disorder (HCC) 06/27/2018   Current severe episode of major depressive disorder without psychotic features without prior episode (HCC) 10/14/2017   Class  1 obesity due to excess calories without serious comorbidity with body mass index (BMI) of 30.0 to 30.9 in adult 10/14/2017   Pure hypercholesterolemia 10/14/2017   Displacement of intrauterine contraceptive device 04/10/2016    IUD suspected to be in abdominal cavity.   After laparoscopy, IUD found in cul-de-sac and removed.      Thyroid enlargement 11/30/2014    Mild thyroid megaly with a solitary 10 mm right complex cyst.    Genital herpes simplex type 2 10/12/2014    Past Surgical History: Past Surgical History:  Procedure Laterality Date   INTRAUTERINE DEVICE (IUD) INSERTION  2018   INTRAUTERINE DEVICE (IUD) INSERTION N/A 04/26/2016   Procedure: INTRAUTERINE DEVICE (IUD) INSERTION;  Surgeon: Conard Novak, MD;  Location: [facility];  Service: Gynecology;  Laterality: N/A;  lot tu01sce exp 09/012020 mirena   IUD REMOVAL N/A 04/26/2016   Procedure: INTRAUTERINE  DEVICE (IUD) REMOVAL;  Surgeon: Conard Novak, MD;  Location: [facility] OR[facility];  Service: Gynecology;  Laterality: N/A;   LAPAROSCOPY N/A 04/26/2016   Procedure: LAPAROSCOPY OPERATIVE;  Surgeon: Conard Novak, MD;  Location: [facility];  Service: Gynecology;  Laterality: N/A;   nexplanon  2020    Past Obstetric History: # 1 - Date: None, Sex: None, Weight: None, GA: None, Delivery: None, Apgar1: None, Apgar5: None, Living: None, Birth Comments: None  # 2 - Date: None, Sex: None, Weight: None, GA: None, Delivery: None, Apgar1: None, Apgar5: None, Living: None, Birth Comments: None  # 3 - Date: None, Sex: None, Weight: None, GA: None, Delivery: None, Apgar1: None, Apgar5: None, Living: None, Birth Comments: None  # 4 - Date: None, Sex: None, Weight: None, GA: None, Delivery: None, Apgar1: None, Apgar5: None, Living: None, Birth Comments: None   Past Gynecologic History:  Family History: Family History  Problem Relation Age of Onset   Diabetes Maternal Grandfather     Social History: Social History   Socioeconomic History   Marital status: Married    Spouse name: Aryana Wonnacott   Number of children: 2   Years of education: Not on file   Highest education level: Not on file  Occupational History   Occupation: Administrator, sports    Comment: Unemployed since [place] 19  Tobacco Use   Smoking status: Never   Smokeless tobacco: Never  Vaping Use   Vaping Use: Never used  Substance and Sexual Activity   Alcohol use: No   Drug use: No   Sexual activity: Yes    Birth control/protection: None  Other Topics Concern   Not on file  Social History Narrative   Out of work due to [employer]   Social Determinants of Health   Financial Resource Strain: Not on file  Food Insecurity: Not on file  Transportation Needs: Not on file  Physical Activity: Not on file  Stress: Not on file  Social Connections: Not on file  Intimate Partner Violence: Not on file    Medications: Prior to Admission  medications   Medication Sig Start Date End Date Taking? Authorizing Provider  valACYclovir (VALTREX) 500 MG tablet Take 1 tablet (500 mg total) by mouth 2 (two) times daily. 12/14/20  Yes Tresea Mall, CNM    Allergies: No Known Allergies  Physical Exam: Vitals: Blood pressure 115/76, pulse 87, temperature 98.7 F (37.1 C), temperature source Oral, resp. rate 16, last menstrual period 03/07/2020, unknown if currently breastfeeding.  Urine Dip Protein: none  FHT: 130, moderate, +accels.  Toco: q50min  General: NAD HEENT: normocephalic,  anicteric Pulmonary: No increased work of breathing Cardiovascular: RRR, distal pulses 2+ Abdomen: Gravid, non-tender Leopolds:vtx Genitourinary: Dilation: 1 Effacement (%): Thick Station: Ballotable Presentation: Vertex Exam by:: Chalmers Guest RN No visible lesions on speculum exam has been on valtrex Extremities: no edema, erythema, or tenderness Neurologic: Grossly intact Psychiatric: mood appropriate, affect full  Labs: No results found for this or any previous visit (from the past 24 hour(s)).  Assessment: 35 y.o. [id number] [redacted]w[redacted]d by 01/23/2021, by Ultrasound presenting for postdates IOL  Plan: 1) IOL - contracting to much for cytotec, will augment with pitocin  2) Fetus - cat I tracing  3) PNL - Blood type A/Positive/-- (09/20 1517) / Anti-bodyscreen Negative (09/20 1517) / Rubella 1.05 (09/20 1517) / Varicella Immune / RPR Non Reactive (09/20 1517) / HBsAg Negative (09/20 1517) / HIV Non Reactive (09/20 1517) / 1-hr OGTT 78 / GBS Negative/-- (11/30 1628)  4) Immunization History -  Immunization History  Administered Date(s) Administered   Influenza,inj,Quad PF,6+ Mos 10/14/2017   Tdap 10/14/2017, 12/12/2020    5) Disposition - pending delivery  Vena Austria, MD, [facility], [facility] 01/27/2021, 1:33 AM

## 2021-01-28 ENCOUNTER — Encounter
Admission: EM | Disposition: A | Discharge status: Home / Self Care | Payer: Self-pay | Source: Home / Self Care | Attending: Obstetrics and Gynecology

## 2021-01-28 LAB — CBC
HCT: 30.1 % — ABNORMAL LOW (ref 36.0–46.0)
Hemoglobin: 10.1 g/dL — ABNORMAL LOW (ref 12.0–15.0)
MCH: 31.4 pg (ref 26.0–34.0)
MCHC: 33.6 g/dL (ref 30.0–36.0)
MCV: 93.5 fL (ref 80.0–100.0)
Platelets: 227 10*3/uL (ref 150–400)
RBC: 3.22 MIL/uL — ABNORMAL LOW (ref 3.87–5.11)
RDW: 13 % (ref 11.5–15.5)
WBC: 9.8 10*3/uL (ref 4.0–10.5)
nRBC: 0 % (ref 0.0–0.2)

## 2021-01-28 SURGERY — LIGATION, FALLOPIAN TUBE, POSTPARTUM
Anesthesia: Choice | Laterality: Bilateral

## 2021-01-28 MED ORDER — IBUPROFEN 600 MG PO TABS: 600.0000 mg | ORAL_TABLET | Freq: Four times a day (QID) | ORAL | 0 refills | Status: DC

## 2021-01-28 SURGICAL SUPPLY — 27 items
CHLORAPREP W/TINT 26 (MISCELLANEOUS) ×2 IMPLANT
DERMABOND ADVANCED (GAUZE/BANDAGES/DRESSINGS) ×1
DERMABOND ADVANCED .7 DNX12 (GAUZE/BANDAGES/DRESSINGS) ×1 IMPLANT
DRAPE LAPAROTOMY 100X77 ABD (DRAPES) ×2 IMPLANT
DRSG TEGADERM 2-3/8X2-3/4 SM (GAUZE/BANDAGES/DRESSINGS) ×2 IMPLANT
DRSG TELFA 4X3 1S NADH ST (GAUZE/BANDAGES/DRESSINGS) ×2 IMPLANT
ELECT CAUTERY BLADE 6.4 (BLADE) ×2 IMPLANT
ELECT REM PT RETURN 9FT ADLT (ELECTROSURGICAL) ×2
ELECTRODE REM PT RTRN 9FT ADLT (ELECTROSURGICAL) ×1 IMPLANT
GAUZE 4X4 16PLY ~~LOC~~+RFID DBL (SPONGE) ×2 IMPLANT
GLOVE SURG ENC MOIS LTX SZ8 (GLOVE) ×2 IMPLANT
GOWN STRL REUS W/ TWL LRG LVL3 (GOWN DISPOSABLE) ×1 IMPLANT
GOWN STRL REUS W/ TWL XL LVL3 (GOWN DISPOSABLE) ×1 IMPLANT
GOWN STRL REUS W/TWL LRG LVL3 (GOWN DISPOSABLE) ×1
GOWN STRL REUS W/TWL XL LVL3 (GOWN DISPOSABLE) ×1
LABEL OR SOLS (LABEL) ×2 IMPLANT
MANIFOLD NEPTUNE II (INSTRUMENTS) ×2 IMPLANT
NEEDLE HYPO 22GX1.5 SAFETY (NEEDLE) ×2 IMPLANT
NS IRRIG 500ML POUR BTL (IV SOLUTION) ×2 IMPLANT
PACK BASIN MINOR ARMC (MISCELLANEOUS) ×2 IMPLANT
SCRUB EXIDINE 4% CHG 4OZ (MISCELLANEOUS) ×2 IMPLANT
STRAP SAFETY 5IN WIDE (MISCELLANEOUS) ×2 IMPLANT
SUT VIC AB 0 SH 27 (SUTURE) ×4 IMPLANT
SUT VIC AB 2-0 UR6 27 (SUTURE) ×2 IMPLANT
SUT VIC AB 4-0 PS2 18 (SUTURE) IMPLANT
SYR 10ML LL (SYRINGE) ×2 IMPLANT
WATER STERILE IRR 500ML POUR (IV SOLUTION) ×2 IMPLANT

## 2021-01-28 NOTE — Anesthesia Postprocedure Evaluation (Signed)
Anesthesia Post Note  Patient: Kari Frost  Procedure(s) Performed: AN AD HOC LABOR EPIDURAL  Patient location during evaluation: Mother Baby Anesthesia Type: Epidural Level of consciousness: awake and alert Pain management: pain level controlled Vital Signs Assessment: post-procedure vital signs reviewed and stable Respiratory status: spontaneous breathing, nonlabored ventilation and respiratory function stable Cardiovascular status: stable Postop Assessment: no headache, no backache and epidural receding Anesthetic complications: no   No notable events documented.   Last Vitals:  Vitals:   01/28/21 0830 01/28/21 1100  BP: 109/62 114/73  Pulse: 73 66  Resp: 18 18  Temp: 36.7 C 36.9 C  SpO2: 98% 100%    Last Pain:  Vitals:   01/28/21 1100  TempSrc: Oral  PainSc:                  Corinda Gubler

## 2021-01-28 NOTE — Progress Notes (Signed)
Discharge and follow up instructions reviewed with patient who verbalized understanding.  Patient denies any further questions or concerns.  Discharged home with infant. Significant other at side.

## 2021-02-02 ENCOUNTER — Telehealth: Payer: Self-pay

## 2021-02-02 ENCOUNTER — Telehealth: Payer: Self-pay | Admitting: Obstetrics & Gynecology

## 2021-02-02 NOTE — Telephone Encounter (Signed)
Pt calling; delivered Friday; vagina still hurts; one side has a big lump/swollen.  Anything she can do or be seen?  [phone]

## 2021-02-02 NOTE — Telephone Encounter (Signed)
-----   Message from Malachy Mood, MD sent at 01/28/2021  9:10 AM EST ----- Regarding: surgery schedulingTubal ligaton Surgery Booking Request Patient Full Name:  Kari Frost  MRN: [id number]  DOB: 1985/05/21  Surgeon: Malachy Mood, MD  Requested Surgery Date and Time: 6 week from delivery 01/27/2021 Primary Diagnosis AND Code: Unwanted fertility Secondary Diagnosis and Code:  Surgical Procedure: Laparoscopy with Tubal Partial Salingectomy RNFA Requested?: No L&D Notification: No Admission Status: same day surgery Length of Surgery: 50 min Special Case Needs: No H&P: Yes Phone Interview???:  Yes Interpreter: No Medical Clearance:  No Special Scheduling Instructions: No Any known health/anesthesia issues, diabetes, sleep apnea, latex allergy, defibrillator/pacemaker?: No Acuity: P2   (P1 highest, P2 delay may cause harm, P3 low, elective gyn, P4 lowest) Post op follow up visits: 1 week and 6 week (schedule postpartum visit at 4 weeks from 12/30 delivery date and can be H&P that date as well)

## 2021-02-02 NOTE — Telephone Encounter (Signed)
Patient is aware of 4 wk pp/ H&P with Dr. Tiburcio Pea on 1/26 @ 9:55am, Pre-admit Testing phone interview to be scheduled (patient will watch for MyChart notification), OR on 2/9, and Post Op on 2/14 @ 9:55am.

## 2021-02-06 NOTE — Telephone Encounter (Signed)
Called patient about her perineal pain. Advised her to do tub soaks, add a cool pack if she desires. She admits to being up and doing housework soon after getting home. Suggested she is doing too much too soon. If she rests and does the above measures and still has problems, then she is advised to make an appt .  Mirna Mires, CNM  02/06/2021 5:04 PM

## 2021-02-23 ENCOUNTER — Encounter: Payer: Self-pay | Admitting: Obstetrics & Gynecology

## 2021-02-23 ENCOUNTER — Ambulatory Visit (INDEPENDENT_AMBULATORY_CARE_PROVIDER_SITE_OTHER): Payer: BC Managed Care – PPO | Admitting: Obstetrics & Gynecology

## 2021-02-23 ENCOUNTER — Other Ambulatory Visit: Payer: Self-pay

## 2021-02-23 ENCOUNTER — Other Ambulatory Visit (HOSPITAL_COMMUNITY)
Admission: RE | Admit: 2021-02-23 | Discharge: 2021-02-23 | Disposition: A | Discharge status: Home / Self Care | Payer: BC Managed Care – PPO | Source: Ambulatory Visit | Attending: Obstetrics & Gynecology | Admitting: Obstetrics & Gynecology

## 2021-02-23 DIAGNOSIS — Z124 Encounter for screening for malignant neoplasm of cervix: Secondary | ICD-10-CM | POA: Diagnosis present

## 2021-02-23 DIAGNOSIS — Z3009 Encounter for other general counseling and advice on contraception: Secondary | ICD-10-CM

## 2021-02-23 DIAGNOSIS — F53 Postpartum depression: Secondary | ICD-10-CM

## 2021-02-23 MED ORDER — SERTRALINE HCL 50 MG PO TABS: 50.0000 mg | ORAL_TABLET | Freq: Every day | ORAL | 11 refills | Status: DC

## 2021-02-23 NOTE — Progress Notes (Addendum)
°  OBSTETRICS POSTPARTUM CLINIC PROGRESS NOTE  Subjective:     Kari Frost is a 36 y.o. [phone] female who presents for a postpartum visit. She is 4 weeks postpartum following a Term pregnancy, Single fetus, or Uncomplicated pregnancy and delivery by Vaginal, no problems at delivery.  I have fully reviewed the prenatal and intrapartum course. Anesthesia: epidural.  Postpartum course has been complicated by uncomplicated.  Baby is feeding by Breast.  Bleeding: patient has not  resumed menses.  Bowel function is normal. Bladder function is normal.  Patient is not sexually active. Contraception method desired is tubal ligation.  Postpartum depression screening: positive. Feels more anxiety and less support from family.  [place].  The following portions of the patient's history were reviewed and updated as appropriate: allergies, current medications, past family history, past medical history, past social history, past surgical history, and problem list.  Review of Systems Pertinent items are noted in HPI.  Objective:    BP 120/80    Ht 5\' 5"  (1.651 m)    Wt 171 lb (77.6 kg)    BMI 28.46 kg/m   General:  alert and no distress   Breasts:  inspection negative, no nipple discharge or bleeding, no masses or nodularity palpable  Lungs: clear to auscultation bilaterally  Heart:  regular rate and rhythm, S1, S2 normal, no murmur, click, rub or gallop  Abdomen: soft, non-tender; bowel sounds normal; no masses,  no organomegaly.     Vulva:  normal  Vagina: normal vagina, no discharge, exudate, lesion, or erythema  Cervix:  no cervical motion tenderness and no lesions  Corpus: normal size, contour, position, consistency, mobility, non-tender  Adnexa:  normal adnexa and no mass, fullness, tenderness  Rectal Exam: Not performed.          Assessment:  Post Partum Care visit 1. Postpartum care and examination  2. Sterilization consult See note for preop Consults today  3.  Screening for cervical cancer - Cytology - PAP  4. Postpartum depression - sertraline (ZOLOFT) 50 MG tablet; Take 1 tablet (50 mg total) by mouth daily. Start by taking 1/2 pill daily for one week  Dispense: 30 tablet; Refill: 11   Plan:  See orders and Patient Instructions Resume all normal activities Follow up in: 4 weeks (2 weeks after lap surgery) or as needed.   , MD, Annamarie Major Ob/G[facility], [facility] 02/23/2021  10:39 AM

## 2021-02-23 NOTE — Progress Notes (Signed)
PRE-OPERATIVE HISTORY AND PHYSICAL EXAM  HPI:  Kari Frost is a 36 y.o. [phone] No LMP recorded.; she is being admitted for surgery related to requested sterilization.  PMHx: Past Medical History:  Diagnosis Date   Genital herpes    Thyroid disease    UTI (urinary tract infection)    Past Surgical History:  Procedure Laterality Date   INTRAUTERINE DEVICE (IUD) INSERTION  2018   INTRAUTERINE DEVICE (IUD) INSERTION N/A 04/26/2016   Procedure: INTRAUTERINE DEVICE (IUD) INSERTION;  Surgeon: Will Bonnet, MD;  Location: [facility] ORS;  Service: Gynecology;  Laterality: N/A;  lot tu01sce exp 09/012020 mirena   IUD REMOVAL N/A 04/26/2016   Procedure: INTRAUTERINE DEVICE (IUD) REMOVAL;  Surgeon: Will Bonnet, MD;  Location: [facility] ORS;  Service: Gynecology;  Laterality: N/A;   LAPAROSCOPY N/A 04/26/2016   Procedure: LAPAROSCOPY OPERATIVE;  Surgeon: Will Bonnet, MD;  Location: [facility] ORS;  Service: Gynecology;  Laterality: N/A;   nexplanon  2020   Family History  Problem Relation Age of Onset   Diabetes Maternal Grandfather    Social History   Tobacco Use   Smoking status: Never   Smokeless tobacco: Never  Vaping Use   Vaping Use: Never used  Substance Use Topics   Alcohol use: No   Drug use: No    Current Outpatient Medications:    sertraline (ZOLOFT) 50 MG tablet, Take 1 tablet (50 mg total) by mouth daily. Start by taking 1/2 pill daily for one week, Disp: 30 tablet, Rfl: 11   ibuprofen (ADVIL) 600 MG tablet, Take 1 tablet (600 mg total) by mouth every 6 (six) hours. (Patient not taking: Reported on 02/22/2021), Disp: 30 tablet, Rfl: 0 Allergies: Patient has no known allergies.  Review of Systems  Constitutional:  Negative for chills, fever and malaise/fatigue.  HENT:  Negative for congestion, sinus pain and sore throat.   Eyes:  Negative for blurred vision and pain.  Respiratory:  Negative for cough and wheezing.   Cardiovascular:  Negative for chest pain  and leg swelling.  Gastrointestinal:  Negative for abdominal pain, constipation, diarrhea, heartburn, nausea and vomiting.  Genitourinary:  Negative for dysuria, frequency, hematuria and urgency.  Musculoskeletal:  Negative for back pain, joint pain, myalgias and neck pain.  Skin:  Negative for itching and rash.  Neurological:  Negative for dizziness, tremors and weakness.  Endo/Heme/Allergies:  Does not bruise/bleed easily.  Psychiatric/Behavioral:  Negative for depression. The patient is not nervous/anxious and does not have insomnia.    Objective: BP 120/80    Ht 5\' 5"  (1.651 m)    Wt 171 lb (77.6 kg)    BMI 28.46 kg/m   Filed Weights   02/23/21 0959  Weight: 171 lb (77.6 kg)   Physical Exam Constitutional:      General: She is not in acute distress.    Appearance: She is well-developed.  HENT:     Head: Normocephalic and atraumatic. No laceration.     Right Ear: Hearing normal.     Left Ear: Hearing normal.     Mouth/Throat:     Pharynx: Uvula midline.  Eyes:     Pupils: Pupils are equal, round, and reactive to light.  Neck:     Thyroid: No thyromegaly.  Cardiovascular:     Rate and Rhythm: Normal rate and regular rhythm.     Heart sounds: No murmur heard.   No friction rub. No gallop.  Pulmonary:     Effort: Pulmonary effort  is normal. No respiratory distress.     Breath sounds: Normal breath sounds. No wheezing.  Abdominal:     General: Bowel sounds are normal. There is no distension.     Palpations: Abdomen is soft.     Tenderness: There is no abdominal tenderness. There is no rebound.  Musculoskeletal:        General: Normal range of motion.     Cervical back: Normal range of motion and neck supple.  Neurological:     Mental Status: She is alert and oriented to person, place, and time.     Cranial Nerves: No cranial nerve deficit.  Skin:    General: Skin is warm and dry.  Psychiatric:        Judgment: Judgment normal.  Vitals reviewed.    Assessment: 1.  Postpartum care and examination   2. Sterilization consult   3. Screening for cervical cancer   4. Postpartum depression   Pt desires permanent sterility  The patient has been fully informed about all methods of contraception, both temporary and permanent. She understands that tubal ligation is meant to be permanent, absolute and irreversible. She was told that there is an approximately 1 in 400 chance of a pregnancy in the future after tubal ligation. She was told the short and long term complications of tubal ligation. She understands the risks from this surgery include, but are not limited to, the risks of anesthesia, hemorrhage, infection, perforation, and injury to adjacent structures, bowel, bladder and blood vessels.   Barnett Applebaum, MD, [facility], [facility] 02/23/2021  10:41 AM

## 2021-02-23 NOTE — Patient Instructions (Signed)
Laparoscopic Tubal Ligation, Care After °The following information offers guidance on how to care for yourself after your procedure. Your health care provider may also give you more specific instructions. If you have problems or questions, contact your health care provider. °What can I expect after the procedure? °After the procedure, it is common to have: °A sore throat if general anesthesia was used. °Pain in shoulders, back, and abdomen. This is caused by the gas that was used during the procedure. °Mild discomfort or cramping in your abdomen. °Pain or soreness in the area where the surgical incision was made. °A bloated feeling. °Tiredness. °Nausea and vomiting. °Follow these instructions at home: °Medicines °Take over-the-counter and prescription medicines only as told by your health care provider. °Ask your health care provider if the medicine prescribed to you: °Requires you to avoid driving or using heavy machinery. °Can cause constipation. You may need to take these actions to prevent or treat constipation: °Drink enough fluid to keep your urine pale yellow. °Take over-the-counter or prescription medicines. °Eat foods that are high in fiber, such as beans, whole grains, and fresh fruits and vegetables. °Limit foods that are high in fat and processed sugars, such as fried or sweet foods. °Do not take aspirin because it can cause bleeding. °Incision care °  °Follow instructions from your health care provider about how to take care of your incision. Make sure you: °Wash your hands with soap and water for at least 20 seconds before and after you change your bandage (dressing). If soap and water are not available, use hand sanitizer. °Change your dressing as told by your health care provider. °Leave stitches (sutures), skin glue, or adhesive strips in place. These skin closures may need to stay in place for 2 weeks or longer. If adhesive strip edges start to loosen and curl up, you may trim the loose edges. Do  not remove adhesive strips completely unless your health care provider tells you to do that. °Check your incision area every day for signs of infection. Check for: °Redness, swelling, or more pain. °Fluid or blood. °Warmth. °Pus or a bad smell. °Activity °Rest as told by your health care provider. °Avoid sitting for a long time without moving. Get up to take short walks every 1-2 hours. This is important to improve blood flow and breathing. Ask for help if you feel weak or unsteady. °Do not have sex, douche, or put a tampon or anything else in your vagina for 6 weeks or as long as told by your health care provider. °Do not lift anything that is heavier than your baby for 2 weeks, or the limit that you are told, until your health care provider says that it is safe. °Do not take baths, swim, or use a hot tub until your health care provider approves. Ask your health care provider if you may take showers. You may only be allowed to take sponge baths. °Return to your normal activities as told by your health care provider. Ask your health care provider what activities are safe for you. °General instructions °After the procedure you may need to wear a sanitary pad for vaginal discharge. °Have someone help you with your daily household tasks for the first few days. °Keep all follow-up visits. This is important. °Contact a health care provider if: °You have redness, swelling, or more pain around your incision. °Your incision feels warm to the touch. °You have pus or a bad smell coming from your incision. °The edges of your incision break   open after the sutures have been removed. °Your pain does not improve after 2-3 days. °You have a rash. °You repeatedly become dizzy or light-headed. °Your pain medicine is not helping. °Get help right away if: °You have a fever or chills. °You faint. °You have increasing pain in your abdomen. °You have severe pain in one or both of your shoulders. °You have fluid or blood coming from your  sutures or heavy bleeding from your vagina. °You have shortness of breath or difficulty breathing. °You have chest pain, leg pain, or leg swelling. °You have ongoing nausea, vomiting, or diarrhea. °These symptoms may represent a serious problem that is an emergency. Do not wait to see if the symptoms will go away. Get medical help right away. Call your local emergency services (911 in the [place] Do not drive yourself to the hospital. °Summary °After the procedure, it is common to have mild discomfort or cramping in your abdomen. °After the procedure you may need to wear a sanitary pad for vaginal discharge. °Take over-the-counter and prescription medicines only as told by your health care provider. °Watch for symptoms that should prompt you to call your health care provider. °Keep all follow-up visits. This is important. °This information is not intended to replace advice given to you by your health care provider. Make sure you discuss any questions you have with your health care provider. °Document Revised: 10/02/2019 Document Reviewed: 10/02/2019 °Elsevier Patient Education © 2022 [employer] ° °

## 2021-02-23 NOTE — H&P (View-Only) (Signed)
PRE-OPERATIVE HISTORY AND PHYSICAL EXAM  HPI:  Kari Frost is a 36 y.o. [phone] No LMP recorded.; she is being admitted for surgery related to requested sterilization.  PMHx: Past Medical History:  Diagnosis Date   Genital herpes    Thyroid disease    UTI (urinary tract infection)    Past Surgical History:  Procedure Laterality Date   INTRAUTERINE DEVICE (IUD) INSERTION  2018   INTRAUTERINE DEVICE (IUD) INSERTION N/A 04/26/2016   Procedure: INTRAUTERINE DEVICE (IUD) INSERTION;  Surgeon: Will Bonnet, MD;  Location: [facility] ORS;  Service: Gynecology;  Laterality: N/A;  lot tu01sce exp 09/012020 mirena   IUD REMOVAL N/A 04/26/2016   Procedure: INTRAUTERINE DEVICE (IUD) REMOVAL;  Surgeon: Will Bonnet, MD;  Location: [facility] ORS;  Service: Gynecology;  Laterality: N/A;   LAPAROSCOPY N/A 04/26/2016   Procedure: LAPAROSCOPY OPERATIVE;  Surgeon: Will Bonnet, MD;  Location: [facility] ORS;  Service: Gynecology;  Laterality: N/A;   nexplanon  2020   Family History  Problem Relation Age of Onset   Diabetes Maternal Grandfather    Social History   Tobacco Use   Smoking status: Never   Smokeless tobacco: Never  Vaping Use   Vaping Use: Never used  Substance Use Topics   Alcohol use: No   Drug use: No    Current Outpatient Medications:    sertraline (ZOLOFT) 50 MG tablet, Take 1 tablet (50 mg total) by mouth daily. Start by taking 1/2 pill daily for one week, Disp: 30 tablet, Rfl: 11   ibuprofen (ADVIL) 600 MG tablet, Take 1 tablet (600 mg total) by mouth every 6 (six) hours. (Patient not taking: Reported on 02/22/2021), Disp: 30 tablet, Rfl: 0 Allergies: Patient has no known allergies.  Review of Systems  Constitutional:  Negative for chills, fever and malaise/fatigue.  HENT:  Negative for congestion, sinus pain and sore throat.   Eyes:  Negative for blurred vision and pain.  Respiratory:  Negative for cough and wheezing.   Cardiovascular:  Negative for chest pain  and leg swelling.  Gastrointestinal:  Negative for abdominal pain, constipation, diarrhea, heartburn, nausea and vomiting.  Genitourinary:  Negative for dysuria, frequency, hematuria and urgency.  Musculoskeletal:  Negative for back pain, joint pain, myalgias and neck pain.  Skin:  Negative for itching and rash.  Neurological:  Negative for dizziness, tremors and weakness.  Endo/Heme/Allergies:  Does not bruise/bleed easily.  Psychiatric/Behavioral:  Negative for depression. The patient is not nervous/anxious and does not have insomnia.    Objective: BP 120/80    Ht 5\' 5"  (1.651 m)    Wt 171 lb (77.6 kg)    BMI 28.46 kg/m   Filed Weights   02/23/21 0959  Weight: 171 lb (77.6 kg)   Physical Exam Constitutional:      General: She is not in acute distress.    Appearance: She is well-developed.  HENT:     Head: Normocephalic and atraumatic. No laceration.     Right Ear: Hearing normal.     Left Ear: Hearing normal.     Mouth/Throat:     Pharynx: Uvula midline.  Eyes:     Pupils: Pupils are equal, round, and reactive to light.  Neck:     Thyroid: No thyromegaly.  Cardiovascular:     Rate and Rhythm: Normal rate and regular rhythm.     Heart sounds: No murmur heard.   No friction rub. No gallop.  Pulmonary:     Effort: Pulmonary effort  is normal. No respiratory distress.     Breath sounds: Normal breath sounds. No wheezing.  Abdominal:     General: Bowel sounds are normal. There is no distension.     Palpations: Abdomen is soft.     Tenderness: There is no abdominal tenderness. There is no rebound.  Musculoskeletal:        General: Normal range of motion.     Cervical back: Normal range of motion and neck supple.  Neurological:     Mental Status: She is alert and oriented to person, place, and time.     Cranial Nerves: No cranial nerve deficit.  Skin:    General: Skin is warm and dry.  Psychiatric:        Judgment: Judgment normal.  Vitals reviewed.    Assessment: 1.  Postpartum care and examination   2. Sterilization consult   3. Screening for cervical cancer   4. Postpartum depression   Pt desires permanent sterility  The patient has been fully informed about all methods of contraception, both temporary and permanent. She understands that tubal ligation is meant to be permanent, absolute and irreversible. She was told that there is an approximately 1 in 400 chance of a pregnancy in the future after tubal ligation. She was told the short and long term complications of tubal ligation. She understands the risks from this surgery include, but are not limited to, the risks of anesthesia, hemorrhage, infection, perforation, and injury to adjacent structures, bowel, bladder and blood vessels.   Barnett Applebaum, MD, [facility], [facility] 02/23/2021  10:41 AM

## 2021-02-24 ENCOUNTER — Other Ambulatory Visit: Payer: Self-pay | Admitting: Obstetrics & Gynecology

## 2021-02-24 DIAGNOSIS — Z3009 Encounter for other general counseling and advice on contraception: Secondary | ICD-10-CM

## 2021-02-27 LAB — CYTOLOGY - PAP
Comment: NEGATIVE
Diagnosis: NEGATIVE
High risk HPV: NEGATIVE

## 2021-02-28 ENCOUNTER — Other Ambulatory Visit: Payer: Self-pay

## 2021-02-28 ENCOUNTER — Other Ambulatory Visit
Admission: RE | Admit: 2021-02-28 | Discharge: 2021-02-28 | Disposition: A | Discharge status: Home / Self Care | Payer: BC Managed Care – PPO | Source: Ambulatory Visit | Attending: Obstetrics & Gynecology | Admitting: Obstetrics & Gynecology

## 2021-02-28 HISTORY — DX: Depression, unspecified: F32.A

## 2021-02-28 NOTE — Patient Instructions (Addendum)
Your procedure is scheduled on: Thursday March 09, 2021. Report to Day Surgery inside [facility] 2nd floor, stop by admissions desk before getting on elevator.  To find out your arrival time please call [phone] between 1PM - 3PM on Wednesday March 08, 2021.  Remember: Instructions that are not followed completely may result in serious medical risk,  up to and including death, or upon the discretion of your surgeon and anesthesiologist your  surgery may need to be rescheduled.     _X__ 1. Do not eat food or drink fluids after midnight the night before your procedure.                 No chewing gum or hard candies.   __X__2.  On the morning of surgery brush your teeth with toothpaste and water, you                may rinse your mouth with mouthwash if you wish.  Do not swallow any toothpaste or mouthwash.     _X__ 3.  No Alcohol for 24 hours before or after surgery.   _X__ 4.  Do Not Smoke or use e-cigarettes For 24 Hours Prior to Your Surgery.                 Do not use any chewable tobacco products for at least 6 hours prior to                 Surgery.  _X__  5.  Do not use any recreational drugs (marijuana, cocaine, heroin, ecstasy, MDMA or other)                For at least one week prior to your surgery.  Combination of these drugs with anesthesia                May have life threatening results.  ____  6.  Bring all medications with you on the day of surgery if instructed.   __X__  7.  Notify your doctor if there is any change in your medical condition      (cold, fever, infections).     Do not wear jewelry, make-up, hairpins, clips or nail polish. Do not wear lotions, powders, or perfumes.  Do not shave 48 hours prior to surgery. Men may shave face and neck. Do not bring valuables to the hospital.    [facility] is not responsible for any belongings or valuables.  Contacts, dentures or bridgework may not be worn into surgery. Leave your  suitcase in the car. After surgery it may be brought to your room. For patients admitted to the hospital, discharge time is determined by your treatment team.   Patients discharged the day of surgery will not be allowed to drive home.   Make arrangements for someone to be with you for the first 24 hours of your Same Day Discharge.    __X__ Take these medicines the morning of surgery with A SIP OF WATER:    1. sertraline (ZOLOFT) 50 MG tablet (1/2 pill)  2.   3.   4.  5.  6.  ____ Fleet Enema (as directed)   __X__ Use CHG Soap (or wipes) as directed  ____ Use Benzoyl Peroxide Gel as instructed  ____ Use inhalers on the day of surgery  ____ Stop metformin 2 days prior to surgery    ____ Take 1/2 of usual insulin dose the night before surgery. No insulin the morning  of surgery.   ____ Call your PCP, cardiologist, or Pulmonologist if taking Coumadin/Plavix/aspirin and ask when to stop before your surgery.   __X__ One Week prior to surgery- Stop Anti-inflammatories such as Ibuprofen, Aleve, Advil, Motrin, meloxicam (MOBIC), diclofenac, etodolac, ketorolac, Toradol, Daypro, piroxicam, Goody's or BC powders. OK TO USE TYLENOL IF NEEDED   __X__ Stop supplements until after surgery.    ____ Bring C-Pap to the hospital.    If you have any questions regarding your pre-procedure instructions,  Please call Pre-admit Testing at [phone]

## 2021-03-01 ENCOUNTER — Other Ambulatory Visit: Payer: Self-pay | Admitting: Obstetrics

## 2021-03-01 ENCOUNTER — Telehealth: Payer: Self-pay

## 2021-03-01 DIAGNOSIS — B9689 Other specified bacterial agents as the cause of diseases classified elsewhere: Secondary | ICD-10-CM

## 2021-03-01 DIAGNOSIS — N760 Acute vaginitis: Secondary | ICD-10-CM

## 2021-03-01 MED ORDER — METRONIDAZOLE 500 MG PO TABS: 500.0000 mg | ORAL_TABLET | Freq: Two times a day (BID) | ORAL | 0 refills | Status: AC

## 2021-03-01 NOTE — Telephone Encounter (Signed)
Pt calling; pap smear showed BV; does she need RX?  [phone]

## 2021-03-02 ENCOUNTER — Encounter: Payer: BC Managed Care – PPO | Admitting: Obstetrics & Gynecology

## 2021-03-03 ENCOUNTER — Other Ambulatory Visit
Admission: RE | Admit: 2021-03-03 | Discharge: 2021-03-03 | Disposition: A | Discharge status: Home / Self Care | Payer: BC Managed Care – PPO | Source: Ambulatory Visit | Attending: Obstetrics & Gynecology | Admitting: Obstetrics & Gynecology

## 2021-03-03 ENCOUNTER — Other Ambulatory Visit: Payer: Self-pay

## 2021-03-03 DIAGNOSIS — Z3009 Encounter for other general counseling and advice on contraception: Secondary | ICD-10-CM | POA: Diagnosis present

## 2021-03-03 LAB — CBC
HCT: 38.1 % (ref 36.0–46.0)
Hemoglobin: 12.5 g/dL (ref 12.0–15.0)
MCH: 30.8 pg (ref 26.0–34.0)
MCHC: 32.8 g/dL (ref 30.0–36.0)
MCV: 93.8 fL (ref 80.0–100.0)
Platelets: 261 10*3/uL (ref 150–400)
RBC: 4.06 MIL/uL (ref 3.87–5.11)
RDW: 11.9 % (ref 11.5–15.5)
WBC: 7.8 10*3/uL (ref 4.0–10.5)
nRBC: 0 % (ref 0.0–0.2)

## 2021-03-03 LAB — TYPE AND SCREEN
ABO/RH(D): A POS
Antibody Screen: NEGATIVE
Extend sample reason: UNDETERMINED

## 2021-03-09 ENCOUNTER — Ambulatory Visit: Payer: BC Managed Care – PPO | Admitting: Urgent Care

## 2021-03-09 ENCOUNTER — Encounter
Admission: RE | Disposition: A | Discharge status: Home / Self Care | Payer: Self-pay | Source: Ambulatory Visit | Attending: Obstetrics & Gynecology

## 2021-03-09 ENCOUNTER — Other Ambulatory Visit: Payer: Self-pay

## 2021-03-09 ENCOUNTER — Encounter: Payer: Self-pay | Admitting: Obstetrics & Gynecology

## 2021-03-09 ENCOUNTER — Ambulatory Visit: Payer: BC Managed Care – PPO | Admitting: Anesthesiology

## 2021-03-09 ENCOUNTER — Ambulatory Visit
Admission: RE | Admit: 2021-03-09 | Discharge: 2021-03-09 | Disposition: A | Discharge status: Home / Self Care | Payer: BC Managed Care – PPO | Source: Ambulatory Visit | Attending: Obstetrics & Gynecology | Admitting: Obstetrics & Gynecology

## 2021-03-09 DIAGNOSIS — Z302 Encounter for sterilization: Secondary | ICD-10-CM

## 2021-03-09 DIAGNOSIS — Z3009 Encounter for other general counseling and advice on contraception: Secondary | ICD-10-CM

## 2021-03-09 DIAGNOSIS — O99345 Other mental disorders complicating the puerperium: Secondary | ICD-10-CM | POA: Insufficient documentation

## 2021-03-09 DIAGNOSIS — F53 Postpartum depression: Secondary | ICD-10-CM | POA: Insufficient documentation

## 2021-03-09 HISTORY — PX: LAPAROSCOPIC BILATERAL SALPINGECTOMY: SHX5889

## 2021-03-09 LAB — TYPE AND SCREEN
ABO/RH(D): A POS
Antibody Screen: NEGATIVE

## 2021-03-09 LAB — POCT PREGNANCY, URINE: Preg Test, Ur: NEGATIVE

## 2021-03-09 SURGERY — SALPINGECTOMY, BILATERAL, LAPAROSCOPIC
Anesthesia: General | Laterality: Bilateral

## 2021-03-09 MED ORDER — SCOPOLAMINE 1 MG/3DAYS TD PT72
MEDICATED_PATCH | TRANSDERMAL | Status: AC
  Administered 2021-03-09: 1.5 mg via TRANSDERMAL
  Filled 2021-03-09: qty 1

## 2021-03-09 MED ORDER — MORPHINE SULFATE (PF) 4 MG/ML IV SOLN
INTRAVENOUS | Status: AC
  Administered 2021-03-09: 1 mg
  Filled 2021-03-09: qty 1

## 2021-03-09 MED ORDER — ACETAMINOPHEN 500 MG PO TABS
ORAL_TABLET | ORAL | Status: AC
  Administered 2021-03-09: 1000 mg via ORAL
  Filled 2021-03-09: qty 2

## 2021-03-09 MED ORDER — PROPOFOL 10 MG/ML IV BOLUS
INTRAVENOUS | Status: AC
  Filled 2021-03-09: qty 20

## 2021-03-09 MED ORDER — PROPOFOL 10 MG/ML IV BOLUS
INTRAVENOUS | Status: DC | PRN
  Administered 2021-03-09: 180 mg via INTRAVENOUS

## 2021-03-09 MED ORDER — DEXAMETHASONE SODIUM PHOSPHATE 10 MG/ML IJ SOLN
INTRAMUSCULAR | Status: AC
  Filled 2021-03-09: qty 1

## 2021-03-09 MED ORDER — ROCURONIUM BROMIDE 100 MG/10ML IV SOLN
INTRAVENOUS | Status: DC | PRN
  Administered 2021-03-09: 10 mg via INTRAVENOUS
  Administered 2021-03-09: 35 mg via INTRAVENOUS

## 2021-03-09 MED ORDER — ROCURONIUM BROMIDE 10 MG/ML (PF) SYRINGE
PREFILLED_SYRINGE | INTRAVENOUS | Status: AC
  Filled 2021-03-09: qty 10

## 2021-03-09 MED ORDER — LIDOCAINE HCL (CARDIAC) PF 100 MG/5ML IV SOSY
PREFILLED_SYRINGE | INTRAVENOUS | Status: DC | PRN
  Administered 2021-03-09: 60 mg via INTRAVENOUS

## 2021-03-09 MED ORDER — GABAPENTIN 300 MG PO CAPS: 300.0000 mg | ORAL_CAPSULE | Freq: Once | ORAL | Status: AC

## 2021-03-09 MED ORDER — 0.9 % SODIUM CHLORIDE (POUR BTL) OPTIME
TOPICAL | Status: DC | PRN
  Administered 2021-03-09: 500 mL

## 2021-03-09 MED ORDER — ONDANSETRON HCL 4 MG/2ML IJ SOLN
INTRAMUSCULAR | Status: AC
  Filled 2021-03-09: qty 2

## 2021-03-09 MED ORDER — FENTANYL CITRATE (PF) 100 MCG/2ML IJ SOLN
INTRAMUSCULAR | Status: DC | PRN
  Administered 2021-03-09 (×2): 25 ug via INTRAVENOUS
  Administered 2021-03-09: 50 ug via INTRAVENOUS

## 2021-03-09 MED ORDER — POVIDONE-IODINE 10 % EX SWAB: 2.0000 "application " | Freq: Once | CUTANEOUS | Status: DC

## 2021-03-09 MED ORDER — LACTATED RINGERS IV SOLN: INTRAVENOUS | Status: DC

## 2021-03-09 MED ORDER — CHLORHEXIDINE GLUCONATE 0.12 % MT SOLN: 15.0000 mL | Freq: Once | OROMUCOSAL | Status: AC

## 2021-03-09 MED ORDER — PHENYLEPHRINE 40 MCG/ML (10ML) SYRINGE FOR IV PUSH (FOR BLOOD PRESSURE SUPPORT)
PREFILLED_SYRINGE | INTRAVENOUS | Status: DC | PRN
  Administered 2021-03-09 (×2): 80 ug via INTRAVENOUS

## 2021-03-09 MED ORDER — FAMOTIDINE 20 MG PO TABS: 20.0000 mg | ORAL_TABLET | Freq: Once | ORAL | Status: DC

## 2021-03-09 MED ORDER — MORPHINE SULFATE (PF) 2 MG/ML IV SOLN: 1.0000 mg | INTRAVENOUS | Status: DC | PRN

## 2021-03-09 MED ORDER — LACTATED RINGERS IR SOLN
Status: DC | PRN
  Administered 2021-03-09: 100 mL

## 2021-03-09 MED ORDER — SCOPOLAMINE 1 MG/3DAYS TD PT72: 1.0000 | MEDICATED_PATCH | Freq: Once | TRANSDERMAL | Status: DC

## 2021-03-09 MED ORDER — ACETAMINOPHEN 325 MG PO TABS: 650.0000 mg | ORAL_TABLET | ORAL | Status: DC | PRN

## 2021-03-09 MED ORDER — HYDROMORPHONE HCL 1 MG/ML IJ SOLN
INTRAMUSCULAR | Status: DC | PRN
  Administered 2021-03-09: .5 mg via INTRAVENOUS

## 2021-03-09 MED ORDER — DEXAMETHASONE SODIUM PHOSPHATE 10 MG/ML IJ SOLN
INTRAMUSCULAR | Status: DC | PRN
  Administered 2021-03-09: 10 mg via INTRAVENOUS

## 2021-03-09 MED ORDER — HEMOSTATIC AGENTS (NO CHARGE) OPTIME
TOPICAL | Status: DC | PRN
  Administered 2021-03-09: 1 via TOPICAL

## 2021-03-09 MED ORDER — CELECOXIB 200 MG PO CAPS: 200.0000 mg | ORAL_CAPSULE | Freq: Once | ORAL | Status: DC

## 2021-03-09 MED ORDER — OXYCODONE-ACETAMINOPHEN 5-325 MG PO TABS: 1.0000 | ORAL_TABLET | ORAL | 0 refills | Status: DC | PRN

## 2021-03-09 MED ORDER — CELECOXIB 200 MG PO CAPS
ORAL_CAPSULE | ORAL | Status: AC
  Filled 2021-03-09: qty 1

## 2021-03-09 MED ORDER — ACETAMINOPHEN 650 MG RE SUPP
650.0000 mg | RECTAL | Status: DC | PRN
  Filled 2021-03-09: qty 1

## 2021-03-09 MED ORDER — MIDAZOLAM HCL 2 MG/2ML IJ SOLN
INTRAMUSCULAR | Status: DC | PRN
  Administered 2021-03-09: 2 mg via INTRAVENOUS

## 2021-03-09 MED ORDER — BUPIVACAINE HCL (PF) 0.5 % IJ SOLN
INTRAMUSCULAR | Status: DC | PRN
  Administered 2021-03-09: 8 mL

## 2021-03-09 MED ORDER — LIDOCAINE HCL (PF) 2 % IJ SOLN
INTRAMUSCULAR | Status: AC
  Filled 2021-03-09: qty 5

## 2021-03-09 MED ORDER — ONDANSETRON HCL 4 MG/2ML IJ SOLN
INTRAMUSCULAR | Status: DC | PRN
Start: 2021-03-09 — End: 2021-03-09
  Administered 2021-03-09: 4 mg via INTRAVENOUS

## 2021-03-09 MED ORDER — MIDAZOLAM HCL 2 MG/2ML IJ SOLN
INTRAMUSCULAR | Status: AC
  Filled 2021-03-09: qty 2

## 2021-03-09 MED ORDER — FENTANYL CITRATE (PF) 100 MCG/2ML IJ SOLN
INTRAMUSCULAR | Status: AC
  Filled 2021-03-09: qty 2

## 2021-03-09 MED ORDER — OXYCODONE-ACETAMINOPHEN 5-325 MG PO TABS
ORAL_TABLET | ORAL | Status: AC
  Filled 2021-03-09: qty 1

## 2021-03-09 MED ORDER — BUPIVACAINE HCL (PF) 0.5 % IJ SOLN
INTRAMUSCULAR | Status: AC
  Filled 2021-03-09: qty 30

## 2021-03-09 MED ORDER — HYDROMORPHONE HCL 1 MG/ML IJ SOLN
INTRAMUSCULAR | Status: AC
  Filled 2021-03-09: qty 1

## 2021-03-09 MED ORDER — GABAPENTIN 300 MG PO CAPS
ORAL_CAPSULE | ORAL | Status: AC
  Administered 2021-03-09: 300 mg via ORAL
  Filled 2021-03-09: qty 1

## 2021-03-09 MED ORDER — CHLORHEXIDINE GLUCONATE 0.12 % MT SOLN
OROMUCOSAL | Status: AC
  Administered 2021-03-09: 15 mL via OROMUCOSAL
  Filled 2021-03-09: qty 15

## 2021-03-09 MED ORDER — ORAL CARE MOUTH RINSE: 15.0000 mL | Freq: Once | OROMUCOSAL | Status: AC

## 2021-03-09 MED ORDER — OXYCODONE-ACETAMINOPHEN 5-325 MG PO TABS
1.0000 | ORAL_TABLET | ORAL | Status: DC | PRN
  Administered 2021-03-09: 1 via ORAL

## 2021-03-09 MED ORDER — SUGAMMADEX SODIUM 200 MG/2ML IV SOLN
INTRAVENOUS | Status: DC | PRN
  Administered 2021-03-09: 200 mg via INTRAVENOUS

## 2021-03-09 MED ORDER — ACETAMINOPHEN 500 MG PO TABS
1000.0000 mg | ORAL_TABLET | Freq: Once | ORAL | Status: AC
Start: 2021-03-09 — End: 2021-03-09

## 2021-03-09 SURGICAL SUPPLY — 40 items
ADH SKN CLS APL DERMABOND .7 (GAUZE/BANDAGES/DRESSINGS) ×1
APL PRP STRL LF DISP 70% ISPRP (MISCELLANEOUS) ×1
APL SRG 38 LTWT LNG FL B (MISCELLANEOUS) ×1
APPLICATOR ARISTA FLEXITIP XL (MISCELLANEOUS) ×1 IMPLANT
BACTOSHIELD CHG 4% 4OZ (MISCELLANEOUS) ×1
BLADE SURG SZ11 CARB STEEL (BLADE) ×2 IMPLANT
CATH ROBINSON RED A/P 16FR (CATHETERS) ×2 IMPLANT
CHLORAPREP W/TINT 26 (MISCELLANEOUS) ×2 IMPLANT
DERMABOND ADVANCED (GAUZE/BANDAGES/DRESSINGS) ×1
DERMABOND ADVANCED .7 DNX12 (GAUZE/BANDAGES/DRESSINGS) ×1 IMPLANT
DRSG TEGADERM 2-3/8X2-3/4 SM (GAUZE/BANDAGES/DRESSINGS) ×3 IMPLANT
GAUZE 4X4 16PLY ~~LOC~~+RFID DBL (SPONGE) ×3 IMPLANT
GLOVE SURG ENC MOIS LTX SZ8 (GLOVE) ×4 IMPLANT
GLOVE SURG UNDER LTX SZ8 (GLOVE) ×2 IMPLANT
GOWN STRL REUS W/ TWL LRG LVL3 (GOWN DISPOSABLE) ×1 IMPLANT
GOWN STRL REUS W/ TWL XL LVL3 (GOWN DISPOSABLE) ×1 IMPLANT
GOWN STRL REUS W/TWL LRG LVL3 (GOWN DISPOSABLE) ×2
GOWN STRL REUS W/TWL XL LVL3 (GOWN DISPOSABLE) ×2
GRASPER SUT TROCAR 14GX15 (MISCELLANEOUS) ×2 IMPLANT
HEMOSTAT ARISTA ABSORB 1G (HEMOSTASIS) ×1 IMPLANT
IRRIGATION STRYKERFLOW (MISCELLANEOUS) IMPLANT
IRRIGATOR STRYKERFLOW (MISCELLANEOUS) ×2
IV LACTATED RINGERS 1000ML (IV SOLUTION) ×1 IMPLANT
KIT PINK PAD W/HEAD ARE REST (MISCELLANEOUS) ×2
KIT PINK PAD W/HEAD ARM REST (MISCELLANEOUS) ×1 IMPLANT
LABEL OR SOLS (LABEL) ×2 IMPLANT
MANIFOLD NEPTUNE II (INSTRUMENTS) ×2 IMPLANT
NEEDLE VERESS 14GA 120MM (NEEDLE) ×2 IMPLANT
NS IRRIG 500ML POUR BTL (IV SOLUTION) ×2 IMPLANT
PACK GYN LAPAROSCOPIC (MISCELLANEOUS) ×2 IMPLANT
PAD PREP 24X41 OB/GYN DISP (PERSONAL CARE ITEMS) ×2 IMPLANT
SCRUB CHG 4% DYNA-HEX 4OZ (MISCELLANEOUS) ×1 IMPLANT
SET TUBE SMOKE EVAC HIGH FLOW (TUBING) ×2 IMPLANT
SHEARS HARMONIC ACE PLUS 36CM (ENDOMECHANICALS) ×1 IMPLANT
SPONGE GAUZE 2X2 8PLY STRL LF (GAUZE/BANDAGES/DRESSINGS) ×1 IMPLANT
STRAP SAFETY 5IN WIDE (MISCELLANEOUS) ×2 IMPLANT
SUT VIC AB 3-0 SH 27 (SUTURE) ×2
SUT VIC AB 3-0 SH 27X BRD (SUTURE) IMPLANT
SYR 10ML LL (SYRINGE) ×2 IMPLANT
TROCAR XCEL NON-BLD 5MMX100MML (ENDOMECHANICALS) ×2 IMPLANT

## 2021-03-09 NOTE — Op Note (Signed)
°  Operative Note   03/09/2021  PRE-OP DIAGNOSIS: Desire for permanent sterilization  POST-OP DIAGNOSIS: same   PROCEDURE: Procedure(s): LAPAROSCOPY WITH TUBAL PARTIAL SALPINGECTOMY   SURGEON: Barnett Applebaum, MD, FACOG  ANESTHESIA: General   ESTIMATED BLOOD LOSS: Min  COMPLICATIONS: None  DISPOSITION: PACU - hemodynamically stable.  CONDITION: stable  FINDINGS: Laparoscopic survey of the abdomen revealed a grossly normal uterus, tubes, ovaries, liver edge, gallbladder edge and appendix, No intra-abdominal adhesions were noted.  PROCEDURE IN DETAIL: The patient was taken to the OR where anesthesia was administed. The patient was positioned in dorsal lithotomy in the [place]. The patient was then examined under anesthesia with the above noted findings. The patient was prepped and draped in the normal sterile fashion and bladder was drained using a red rubber cathater. Speculum exam normal, and a Hulka tenaculum was placed for manipulation purposes.  Attention was turned to the patients abdomen where a 5 mm skin incision was made in the umbilical fold, after injection of local anesthesia. The Veress step needle was carefully introduced into the peritoneal cavity with placement confirmed using the hanging drop technique.  Pneumoperitoneum was obtained. The 5 mm port was then placed under direct visualization with the operative laparoscope  The above noted findings.  Trendelenburg.  A 5 mm trocar was then placed in the right and then also left lower quadrant under direct visualization with the laparoscope.  Right and left fallopian tubes are identified and followed out to their fimbria.  Each tube is excised utilizing the Harmonic scapel to include the fibria.  No injuries or bleeding was noted.  The hulka tenaculum was noted to have a small perforation along the posterior uterine wall.  No colon or bowel involvement noted.  Tenaculum removed and hemostasis assured using Klepinger bipolar  cautery device.  All instruments and ports were then removed from the abdomen after gas was expelled and patient was leveled.   The skin was closed with skin adhesive. The patient tolerated the procedure well. All counts were correct x 2. The patient was transferred to the recovery room awake, alert and breathing independently.  Barnett Applebaum, MD, [facility], [facility] 03/09/2021  12:27 PM

## 2021-03-09 NOTE — Anesthesia Procedure Notes (Signed)
Procedure Name: Intubation Date/Time: 03/09/2021 11:23 AM Performed by: Loletha Grayer, CRNA Pre-anesthesia Checklist: Patient identified, Patient being monitored, Timeout performed, Emergency Drugs available and Suction available Patient Re-evaluated:Patient Re-evaluated prior to induction Oxygen Delivery Method: Circle system utilized Preoxygenation: Pre-oxygenation with 100% oxygen Induction Type: IV induction Ventilation: Mask ventilation without difficulty Laryngoscope Size: Mac and 3 Grade View: Grade I Tube type: Oral Tube size: 7.0 mm Number of attempts: 1 Airway Equipment and Method: Stylet Placement Confirmation: ETT inserted through vocal cords under direct vision, positive ETCO2 and breath sounds checked- equal and bilateral Secured at: 21 cm Tube secured with: Tape Dental Injury: Teeth and Oropharynx as per pre-operative assessment

## 2021-03-09 NOTE — Transfer of Care (Signed)
Immediate Anesthesia Transfer of Care Note  Patient: Kari Frost  Procedure(s) Performed: LAPAROSCOPY WITH TUBAL PARTIAL SALPINGECTOMY (Bilateral)  Patient Location: PACU  Anesthesia Type:General  Level of Consciousness: drowsy and patient cooperative  Airway & Oxygen Therapy: Patient Spontanous Breathing and Patient connected to face mask oxygen  Post-op Assessment: Report given to RN and Post -op Vital signs reviewed and stable  Post vital signs: Reviewed and stable  Last Vitals:  Vitals Value Taken Time  BP 108/71 03/09/21 1231  Temp    Pulse 59 03/09/21 1233  Resp 19 03/09/21 1232  SpO2 100 % 03/09/21 1233  Vitals shown include unvalidated device data.  Last Pain:  Vitals:   03/09/21 1032  TempSrc: Oral  PainSc: 0-No pain         Complications: No notable events documented.

## 2021-03-09 NOTE — Interval H&P Note (Signed)
History and Physical Interval Note:  03/09/2021 10:31 AM  Kari Frost  has presented today for surgery, with the diagnosis of Unwanted fertility.  The various methods of treatment have been discussed with the patient and family. After consideration of risks, benefits and other options for treatment, the patient has consented to  Procedure(s): LAPAROSCOPY WITH TUBAL PARTIAL SALPINGECTOMY (Bilateral) as a surgical intervention.  The patient's history has been reviewed, patient examined, no change in status, stable for surgery.  I have reviewed the patient's chart and labs.  Questions were answered to the patient's satisfaction.     Letitia Libra

## 2021-03-09 NOTE — Anesthesia Preprocedure Evaluation (Signed)
Anesthesia Evaluation  Patient identified by MRN, date of birth, ID band Patient awake    Reviewed: Allergy & Precautions, NPO status , Patient's Chart, lab work & pertinent test results  Airway Mallampati: II  TM Distance: >3 FB Neck ROM: full    Dental no notable dental hx.    Pulmonary neg pulmonary ROS,    Pulmonary exam normal        Cardiovascular negative cardio ROS Normal cardiovascular exam     Neuro/Psych PSYCHIATRIC DISORDERS Depression negative neurological ROS     GI/Hepatic negative GI ROS, Neg liver ROS,   Endo/Other  negative endocrine ROS  Renal/GU negative Renal ROS     Musculoskeletal   Abdominal   Peds  Hematology negative hematology ROS (+)   Anesthesia Other Findings Past Medical History: No date: Depression No date: Genital herpes No date: Thyroid disease No date: UTI (urinary tract infection)  Past Surgical History: 2018: INTRAUTERINE DEVICE (IUD) INSERTION 04/26/2016: INTRAUTERINE DEVICE (IUD) INSERTION; N/A     Comment:  Procedure: INTRAUTERINE DEVICE (IUD) INSERTION;                Surgeon: Will Bonnet, MD;  Location: [facility] ORS;                Service: Gynecology;  Laterality: N/A;  lot tu01sce exp               09/012020 mirena 04/26/2016: IUD REMOVAL; N/A     Comment:  Procedure: INTRAUTERINE DEVICE (IUD) REMOVAL;  Surgeon:               Will Bonnet, MD;  Location: [facility] ORS;  Service:               Gynecology;  Laterality: N/A; 04/26/2016: LAPAROSCOPY; N/A     Comment:  Procedure: LAPAROSCOPY OPERATIVE;  Surgeon: Will Bonnet, MD;  Location: [facility] ORS;  Service: Gynecology;                Laterality: N/A; 2020: nexplanon     Reproductive/Obstetrics negative OB ROS                             Anesthesia Physical Anesthesia Plan  ASA: 2  Anesthesia Plan: General ETT   Post-op Pain Management: Gabapentin PO (pre-op),  Tylenol PO (pre-op) and Celebrex PO (pre-op)   Induction:   PONV Risk Score and Plan: Ondansetron, Dexamethasone, Midazolam, Treatment may vary due to age or medical condition and Scopolamine patch - Pre-op  Airway Management Planned: Oral ETT  Additional Equipment:   Intra-op Plan:   Post-operative Plan: Extubation in OR  Informed Consent:   Plan Discussed with: Anesthesiologist, CRNA and Surgeon  Anesthesia Plan Comments:         Anesthesia Quick Evaluation

## 2021-03-09 NOTE — Discharge Instructions (Addendum)

## 2021-03-09 NOTE — Anesthesia Postprocedure Evaluation (Signed)
Anesthesia Post Note  Patient: Vicie Cech  Procedure(s) Performed: LAPAROSCOPY WITH TUBAL PARTIAL SALPINGECTOMY (Bilateral)  Patient location during evaluation: PACU Anesthesia Type: General Level of consciousness: awake and alert Pain management: pain level controlled Vital Signs Assessment: post-procedure vital signs reviewed and stable Respiratory status: spontaneous breathing, nonlabored ventilation and respiratory function stable Cardiovascular status: blood pressure returned to baseline and stable Postop Assessment: no apparent nausea or vomiting Anesthetic complications: no   No notable events documented.   Last Vitals:  Vitals:   03/09/21 1310 03/09/21 1315  BP: 111/64   Pulse: 81 61  Resp: (!) 26 17  Temp:    SpO2: 99% 100%    Last Pain:  Vitals:   03/09/21 1320  TempSrc:   PainSc: 5                  Foye Deer

## 2021-03-10 ENCOUNTER — Encounter: Payer: Self-pay | Admitting: Obstetrics & Gynecology

## 2021-03-10 LAB — SURGICAL PATHOLOGY

## 2021-03-14 ENCOUNTER — Encounter: Payer: Self-pay | Admitting: Obstetrics & Gynecology

## 2021-03-14 ENCOUNTER — Other Ambulatory Visit: Payer: Self-pay

## 2021-03-14 ENCOUNTER — Ambulatory Visit (INDEPENDENT_AMBULATORY_CARE_PROVIDER_SITE_OTHER): Payer: BC Managed Care – PPO | Admitting: Obstetrics & Gynecology

## 2021-03-14 NOTE — Progress Notes (Signed)
°  OBSTETRICS POSTPARTUM CLINIC PROGRESS NOTE  Subjective:     Kari Frost is a 36 y.o. [phone] female who presents for a postpartum visit. She is 6 weeks postpartum following a Term pregnancy, Single fetus, or Uncomplicated pregnancy and delivery by Vaginal, no problems at delivery.  I have fully reviewed the prenatal and intrapartum course.  She also has since had a Laparoscopic Tubal Procedure. Anesthesia: epidural.  Postpartum course has been complicated by uncomplicated.  Baby is feeding by Breast.  Bleeding: patient has not  resumed menses.  Bowel function is normal. Bladder function is normal.  Patient is not sexually active. Contraception method desired is tubal ligation.  Postpartum depression screening: positive. [place] 11.  She has not yet started Zoloft as previously discussed for her postpartum depression  The following portions of the patient's history were reviewed and updated as appropriate: allergies, current medications, past family history, past medical history, past social history, past surgical history, and problem list.  Review of Systems Pertinent items are noted in HPI.  Objective:    BP 100/70    Ht 5\' 5"  (1.651 m)    Wt 160 lb (72.6 kg)    LMP 02/28/2021    Breastfeeding Yes    BMI 26.63 kg/m   General:  alert and no distress   Breasts:  inspection negative, no nipple discharge or bleeding, no masses or nodularity palpable  Lungs: clear to auscultation bilaterally  Heart:  regular rate and rhythm, S1, S2 normal, no murmur, click, rub or gallop  Abdomen: soft, non-tender; bowel sounds normal; no masses,  no organomegaly.  Well healed tubal incisions   Vulva:  normal  Vagina: normal vagina, no discharge, exudate, lesion, or erythema  Cervix:  no cervical motion tenderness and no lesions  Corpus: normal size, contour, position, consistency, mobility, non-tender  Adnexa:  normal adnexa and no mass, fullness, tenderness  Rectal Exam: Not performed.           Assessment:  Post Partum Care visit 1. Postpartum care and examination 2. Post OP Lap Tubal, doing well  Plan:  See orders and Patient Instructions Resume all normal activities Follow up in:  12  months or as needed.   03/02/2021, MD, Annamarie Major Ob/Gyn, [facility] 03/14/2021  10:12 AM

## 2021-03-28 ENCOUNTER — Encounter: Payer: BC Managed Care – PPO | Admitting: Obstetrics & Gynecology

## 2021-07-11 ENCOUNTER — Other Ambulatory Visit: Payer: BC Managed Care – PPO

## 2021-07-24 ENCOUNTER — Ambulatory Visit (LOCAL_COMMUNITY_HEALTH_CENTER): Payer: Self-pay

## 2021-07-24 DIAGNOSIS — Z111 Encounter for screening for respiratory tuberculosis: Secondary | ICD-10-CM

## 2021-07-27 ENCOUNTER — Ambulatory Visit: Payer: Self-pay | Admitting: Physician Assistant

## 2021-07-27 ENCOUNTER — Encounter: Payer: Self-pay | Admitting: Physician Assistant

## 2021-07-27 ENCOUNTER — Ambulatory Visit (LOCAL_COMMUNITY_HEALTH_CENTER): Payer: Self-pay

## 2021-07-27 DIAGNOSIS — Z111 Encounter for screening for respiratory tuberculosis: Secondary | ICD-10-CM

## 2021-07-27 DIAGNOSIS — Z113 Encounter for screening for infections with a predominantly sexual mode of transmission: Secondary | ICD-10-CM

## 2021-07-27 LAB — WET PREP FOR TRICH, YEAST, CLUE
Trichomonas Exam: NEGATIVE
Yeast Exam: NEGATIVE

## 2021-07-27 LAB — TB SKIN TEST
Induration: 0 mm
TB Skin Test: NEGATIVE

## 2021-07-27 LAB — HM HIV SCREENING LAB: HM HIV Screening: NEGATIVE

## 2021-07-27 NOTE — Progress Notes (Signed)
[facility] Department  STI clinic/screening visit [address] [place] [address] [phone]  Subjective:  Kari Frost is a 36 y.o. female being seen today for an STI screening visit. The patient reports they do have symptoms.  Patient reports that they do not desire a pregnancy in the next year.   They reported they are not interested in discussing contraception today.    Patient's last menstrual period was 07/22/2021.   Patient has the following medical conditions:   Patient Active Problem List   Diagnosis Date Noted   Admission for sterilization 03/09/2021   Mood disorder (HCC) 06/27/2018   Current severe episode of major depressive disorder without psychotic features without prior episode (HCC) 10/14/2017   Class 1 obesity due to excess calories without serious comorbidity with body mass index (BMI) of 30.0 to 30.9 in adult 10/14/2017   Pure hypercholesterolemia 10/14/2017   Displacement of intrauterine contraceptive device 04/10/2016   Thyroid enlargement 11/30/2014   Genital herpes simplex type 2 10/12/2014    Chief Complaint  Patient presents with   SEXUALLY TRANSMITTED DISEASE    Woman here for STD eval due to partner infidelity. Has had "UTI symptoms" over last several days, though these are getting better. Remote h/o genital HSV without recent sx. Had PP BTL in Feb, first period in last week.    Patient reports sx as described in HPI and [facility] STD screen.  Last HIV test per patient/review of record was 2009 Patient reports last pap was 02/23/2021.   Screening for MPX risk: Does the patient have an unexplained rash? No Is the patient MSM? No Does the patient endorse multiple sex partners or anonymous sex partners? No Did the patient have close or sexual contact with a person diagnosed with MPX? No Has the patient traveled outside the [place] where MPX is endemic? No Is there a high clinical suspicion for MPX-- evidenced by one of the  following No  -Unlikely to be chickenpox  -Lymphadenopathy  -Rash that present in same phase of evolution on any given body part See flowsheet for further details and programmatic requirements.   Immunization history:  Immunization History  Administered Date(s) Administered   Influenza,inj,Quad PF,6+ Mos 10/14/2017   PPD Test 07/24/2021   Tdap 10/14/2017, 12/12/2020     The following portions of the patient's history were reviewed and updated as appropriate: allergies, current medications, past medical history, past social history, past surgical history and problem list.  Objective:  There were no vitals filed for this visit.  Physical Exam Vitals and nursing note reviewed.  Constitutional:      Appearance: Normal appearance.  HENT:     Head: Normocephalic and atraumatic.     Mouth/Throat:     Mouth: Mucous membranes are moist.     Pharynx: Oropharynx is clear. No oropharyngeal exudate or posterior oropharyngeal erythema.  Pulmonary:     Effort: Pulmonary effort is normal.  Abdominal:     General: Abdomen is flat.     Palpations: There is no mass.     Tenderness: There is no abdominal tenderness. There is no rebound.  Genitourinary:    General: Normal vulva.     Exam position: Lithotomy position.     Pubic Area: No rash or pubic lice.      Labia:        Right: No rash or lesion.        Left: No rash or lesion.      Vagina: Normal.  No vaginal discharge, erythema or lesions.     Cervix: No cervical motion tenderness, discharge, friability, lesion or erythema.     Uterus: Normal.      Adnexa: Right adnexa normal and left adnexa normal.     Rectum: Normal.     Comments: pH = not tested due to resolving menses Lymphadenopathy:     Head:     Right side of head: No preauricular or posterior auricular adenopathy.     Left side of head: No preauricular or posterior auricular adenopathy.     Cervical: No cervical adenopathy.     Upper Body:     Right upper body: No  supraclavicular, axillary or epitrochlear adenopathy.     Left upper body: No supraclavicular, axillary or epitrochlear adenopathy.     Lower Body: No right inguinal adenopathy. No left inguinal adenopathy.  Skin:    General: Skin is warm and dry.     Findings: No rash.  Neurological:     Mental Status: She is alert and oriented to person, place, and time.      Assessment and Plan:  Kari Frost is a 36 y.o. female presenting to the [facility] Department for STI screening  1. Routine screening for STI (sexually transmitted infection) Wet prep WNL. Await final results of remaining tests. F/u with PCP or urgent care if sx suspicious for UTI worsen or persist. Enc condoms with all sex to reduce STI risk. - WET PREP FOR TRICH, YEAST, CLUE - Chlamydia/Gonorrhea [place] [facility] - HIV [place] [facility] - Syphilis Serology, [place] [facility]   Return in about 6 months (around 01/26/2022) for STI screening.  No future appointments.  Landry Dyke, PA-C

## 2021-07-27 NOTE — Progress Notes (Signed)
Here today for STD testing.Burt Knack, RN

## 2021-07-27 NOTE — Progress Notes (Signed)
Wet prep reviewed, no treatment indicated..Jahna Liebert Brewer-Jensen, RN  

## 2021-08-11 ENCOUNTER — Emergency Department: Payer: BC Managed Care – PPO

## 2021-08-11 ENCOUNTER — Emergency Department
Admission: EM | Admit: 2021-08-11 | Discharge: 2021-08-12 | Disposition: A | Discharge status: Home / Self Care | Payer: BC Managed Care – PPO | Attending: Emergency Medicine | Admitting: Emergency Medicine

## 2021-08-11 ENCOUNTER — Other Ambulatory Visit: Payer: Self-pay

## 2021-08-11 DIAGNOSIS — K8020 Calculus of gallbladder without cholecystitis without obstruction: Secondary | ICD-10-CM | POA: Diagnosis not present

## 2021-08-11 DIAGNOSIS — R109 Unspecified abdominal pain: Secondary | ICD-10-CM | POA: Diagnosis present

## 2021-08-11 DIAGNOSIS — N132 Hydronephrosis with renal and ureteral calculous obstruction: Secondary | ICD-10-CM | POA: Insufficient documentation

## 2021-08-11 DIAGNOSIS — N12 Tubulo-interstitial nephritis, not specified as acute or chronic: Secondary | ICD-10-CM

## 2021-08-11 LAB — URINALYSIS, ROUTINE W REFLEX MICROSCOPIC
Bilirubin Urine: NEGATIVE
Glucose, UA: NEGATIVE mg/dL
Ketones, ur: NEGATIVE mg/dL
Nitrite: NEGATIVE
Protein, ur: 30 mg/dL — AB
Specific Gravity, Urine: 1.014 (ref 1.005–1.030)
WBC, UA: >50 WBC/hpf — ABNORMAL HIGH (ref 0–5)
pH: 9 — ABNORMAL HIGH (ref 5.0–8.0)

## 2021-08-11 LAB — COMPREHENSIVE METABOLIC PANEL
ALT: 14 U/L (ref 0–44)
AST: 14 U/L — ABNORMAL LOW (ref 15–41)
Albumin: 3.9 g/dL (ref 3.5–5.0)
Alkaline Phosphatase: 78 U/L (ref 38–126)
Anion gap: 8 (ref 5–15)
BUN: <5 mg/dL — ABNORMAL LOW (ref 6–20)
CO2: 23 mmol/L (ref 22–32)
Calcium: 8.8 mg/dL — ABNORMAL LOW (ref 8.9–10.3)
Chloride: 106 mmol/L (ref 98–111)
Creatinine, Ser: 0.75 mg/dL (ref 0.44–1.00)
GFR, Estimated: >60 mL/min (ref >60)
Glucose, Bld: 113 mg/dL — ABNORMAL HIGH (ref 70–99)
Potassium: 3.8 mmol/L (ref 3.5–5.1)
Sodium: 137 mmol/L (ref 135–145)
Total Bilirubin: 0.8 mg/dL (ref 0.3–1.2)
Total Protein: 7.3 g/dL (ref 6.5–8.1)

## 2021-08-11 LAB — POC URINE PREG, ED: Preg Test, Ur: NEGATIVE

## 2021-08-11 LAB — CBC
HCT: 39.6 % (ref 36.0–46.0)
Hemoglobin: 12.7 g/dL (ref 12.0–15.0)
MCH: 29.1 pg (ref 26.0–34.0)
MCHC: 32.1 g/dL (ref 30.0–36.0)
MCV: 90.8 fL (ref 80.0–100.0)
Platelets: 353 10*3/uL (ref 150–400)
RBC: 4.36 MIL/uL (ref 3.87–5.11)
RDW: 12.4 % (ref 11.5–15.5)
WBC: 14.3 10*3/uL — ABNORMAL HIGH (ref 4.0–10.5)
nRBC: 0 % (ref 0.0–0.2)

## 2021-08-11 LAB — LIPASE, BLOOD: Lipase: 27 U/L (ref 11–51)

## 2021-08-11 MED ORDER — CEPHALEXIN 500 MG PO CAPS
500.0000 mg | ORAL_CAPSULE | Freq: Four times a day (QID) | ORAL | 0 refills | Status: AC
Start: 2021-08-11 — End: 2021-08-18

## 2021-08-11 MED ORDER — ONDANSETRON HCL 4 MG/2ML IJ SOLN
4.0000 mg | Freq: Once | INTRAMUSCULAR | Status: AC
  Administered 2021-08-11: 4 mg via INTRAVENOUS
  Filled 2021-08-11: qty 2

## 2021-08-11 MED ORDER — ONDANSETRON 4 MG PO TBDP: 4.0000 mg | ORAL_TABLET | Freq: Three times a day (TID) | ORAL | 0 refills | Status: DC | PRN

## 2021-08-11 MED ORDER — SODIUM CHLORIDE 0.9 % IV BOLUS
1000.0000 mL | Freq: Once | INTRAVENOUS | Status: AC
  Administered 2021-08-11: 1000 mL via INTRAVENOUS

## 2021-08-11 MED ORDER — SODIUM CHLORIDE 0.9 % IV SOLN
1.0000 g | Freq: Once | INTRAVENOUS | Status: AC
  Administered 2021-08-11: 1 g via INTRAVENOUS
  Filled 2021-08-11: qty 10

## 2021-08-11 MED ORDER — ACETAMINOPHEN 325 MG PO TABS
650.0000 mg | ORAL_TABLET | Freq: Once | ORAL | Status: AC
  Administered 2021-08-11: 650 mg via ORAL
  Filled 2021-08-11: qty 2

## 2021-08-11 MED ORDER — KETOROLAC TROMETHAMINE 30 MG/ML IJ SOLN
30.0000 mg | Freq: Once | INTRAMUSCULAR | Status: AC
  Administered 2021-08-11: 30 mg via INTRAVENOUS
  Filled 2021-08-11: qty 1

## 2021-08-11 NOTE — ED Triage Notes (Signed)
Pt states she started having right sided flank pain and generalized body aches today. Denies Hx of kidney stones but does have her appendix and gallbladder. Denies any issues with urination

## 2021-08-11 NOTE — ED Notes (Signed)
Patient transported to CT 

## 2021-08-11 NOTE — Discharge Instructions (Addendum)
Take the antibiotics as directed and nausea medicine as needed.  Continue to monitor and treat fevers.  Follow-up with primary provider or this ED for any worsening symptoms as discussed.

## 2021-08-11 NOTE — ED Provider Notes (Signed)
[facility] Emergency Department Provider Note     Event Date/Time   First MD Initiated Contact with Patient 08/11/21 2129     (approximate)   History   Flank Pain and Generalized Body Aches   HPI  Kari Frost is a 36 y.o. female presents to the ED for evaluation of  right-sided flank pain and generalized bodyaches. She notes sudden onset today. She gives a history of kidney stones, but denies dysuria, hematuria, or urinary retention.       Physical Exam   Triage Vital Signs: ED Triage Vitals  Enc Vitals Group     BP 08/11/21 1925 (!) 112/94     Pulse Rate 08/11/21 1925 100     Resp 08/11/21 1925 18     Temp 08/11/21 1925 99.8 F (37.7 C)     Temp Source 08/11/21 1925 Oral     SpO2 08/11/21 1925 100 %     Weight --      Height --      Head Circumference --      Peak Flow --      Pain Score 08/11/21 1926 10     Pain Loc --      Pain Edu? --      Excl. in GC? --     Most recent vital signs: Vitals:   08/11/21 2250 08/12/21 0028  BP: 110/72 112/81  Pulse: 92 84  Resp: 18 18  Temp:  98.6 F (37 C)  SpO2: 97% 98%    General Awake, no distress.  CV:  Good peripheral perfusion.  RESP:  Normal effort.  ABD:  No distention. Right CVA tenderness noted   ED Results / Procedures / Treatments   Labs (all labs ordered are listed, but only abnormal results are displayed) Labs Reviewed  URINE CULTURE - Abnormal; Notable for the following components:      Result Value   Culture MULTIPLE SPECIES PRESENT, SUGGEST RECOLLECTION (*)    All other components within normal limits  COMPREHENSIVE METABOLIC PANEL - Abnormal; Notable for the following components:   Glucose, Bld 113 (*)    BUN <5 (*)    Calcium 8.8 (*)    AST 14 (*)    All other components within normal limits  CBC - Abnormal; Notable for the following components:   WBC 14.3 (*)    All other components within normal limits  URINALYSIS, ROUTINE W REFLEX MICROSCOPIC -  Abnormal; Notable for the following components:   Color, Urine YELLOW (*)    APPearance CLOUDY (*)    pH 9.0 (*)    Hgb urine dipstick SMALL (*)    Protein, ur 30 (*)    Leukocytes,Ua MODERATE (*)    WBC, UA >50 (*)    Bacteria, UA RARE (*)    All other components within normal limits  POC URINE PREG, ED - Normal  LIPASE, BLOOD     EKG   RADIOLOGY  I personally viewed and evaluated these images as part of my medical decision making, as well as reviewing the written report by the radiologist.  ED Provider Interpretation: mild right hydronephrosis. No renal calculi noted  CT Renal Stone Study  IMPRESSION: 1. Mild right hydronephrosis with mild right perinephric and periureteric stranding. No stone identified. Findings may be related to a recently passed right renal calculus versus pyelonephritis. Correlation with urinalysis recommended. 2. No bowel obstruction. Normal appendix. 3. Cholelithiasis.    PROCEDURES:  Critical Care performed:  No  Procedures   MEDICATIONS ORDERED IN ED: Medications  ondansetron (ZOFRAN) injection 4 mg (4 mg Intravenous Given 08/11/21 2222)  sodium chloride 0.9 % bolus 1,000 mL (0 mLs Intravenous Stopped 08/12/21 0029)  acetaminophen (TYLENOL) tablet 650 mg (650 mg Oral Given 08/11/21 2246)  cefTRIAXone (ROCEPHIN) 1 g in sodium chloride 0.9 % 100 mL IVPB (0 g Intravenous Stopped 08/12/21 0029)  ketorolac (TORADOL) 30 MG/ML injection 30 mg (30 mg Intravenous Given 08/11/21 2313)     IMPRESSION / MDM / ASSESSMENT AND PLAN / ED COURSE  I reviewed the triage vital signs and the nursing notes.                              Differential diagnosis includes, but is not limited to, ovarian cyst, ovarian torsion, acute appendicitis, diverticulitis, urinary tract infection/pyelonephritis, endometriosis, bowel obstruction, colitis, renal colic, gastroenteritis, hernia, fibroids, endometriosis, pregnancy related pain including ectopic pregnancy, etc.    Patient's presentation is most consistent with acute complicated illness / injury requiring diagnostic workup.  Patient's diagnosis is consistent with pyelonephritis. Patient work-up reveals CT evidence of a recently passed stone vs pyelo. She is stable after initially presenting febrile and with acute pain. She has responded to antipyretics, IV fluids, IV abx, and spontaneous voiding. She ws considered for potential admission, but appears stable for outpatient management. Patient will be discharged home with prescriptions for keflex and Zofran. Patient is to follow up with her PCP as needed or otherwise directed. Patient is given ED precautions to return to the ED for any worsening or new symptoms.     FINAL CLINICAL IMPRESSION(S) / ED DIAGNOSES   Final diagnoses:  Pyelonephritis     Rx / DC Orders   ED Discharge Orders          Ordered    cephALEXin (KEFLEX) 500 MG capsule  4 times daily        08/11/21 2332    ondansetron (ZOFRAN-ODT) 4 MG disintegrating tablet  Every 8 hours PRN        08/11/21 2332             Note:  This document was prepared using Dragon voice recognition software and may include unintentional dictation errors.    Lissa Hoard, PA-C 08/17/21 1338    Phineas Semen, MD 08/23/21 1504

## 2021-08-13 LAB — URINE CULTURE

## 2021-10-16 ENCOUNTER — Encounter: Payer: Self-pay | Admitting: Emergency Medicine

## 2021-10-16 ENCOUNTER — Ambulatory Visit
Admission: EM | Admit: 2021-10-16 | Discharge: 2021-10-16 | Disposition: A | Discharge status: Home / Self Care | Payer: BC Managed Care – PPO | Attending: Emergency Medicine | Admitting: Emergency Medicine

## 2021-10-16 DIAGNOSIS — R059 Cough, unspecified: Secondary | ICD-10-CM | POA: Insufficient documentation

## 2021-10-16 DIAGNOSIS — J069 Acute upper respiratory infection, unspecified: Secondary | ICD-10-CM | POA: Insufficient documentation

## 2021-10-16 DIAGNOSIS — Z20822 Contact with and (suspected) exposure to covid-19: Secondary | ICD-10-CM | POA: Insufficient documentation

## 2021-10-16 LAB — SARS CORONAVIRUS 2 BY RT PCR: SARS Coronavirus 2 by RT PCR: NEGATIVE

## 2021-10-16 MED ORDER — PROMETHAZINE-DM 6.25-15 MG/5ML PO SYRP: 5.0000 mL | ORAL_SOLUTION | Freq: Four times a day (QID) | ORAL | 0 refills | Status: DC | PRN

## 2021-10-16 MED ORDER — BENZONATATE 100 MG PO CAPS: 200.0000 mg | ORAL_CAPSULE | Freq: Three times a day (TID) | ORAL | 0 refills | Status: DC

## 2021-10-16 MED ORDER — IPRATROPIUM BROMIDE 0.06 % NA SOLN: 2.0000 | Freq: Four times a day (QID) | NASAL | 12 refills | Status: DC

## 2021-10-16 NOTE — ED Triage Notes (Signed)
Pt presents with cough and runny nose x 3 days. Pt exposed to covid at work.

## 2021-10-16 NOTE — ED Provider Notes (Signed)
[facility]    CSN: [id number] Arrival date & time: 10/16/21  1817      History   Chief Complaint Chief Complaint  Patient presents with   Nasal Congestion   Cough    HPI Kari Frost is a 36 y.o. female.   HPI  36 year old female here for evaluation respiratory complaints.  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  For the last 3 days she has been experiencing a runny nose, sore throat, and nonproductive cough.  She denies any fever or GI complaints.  Past Medical History:  Diagnosis Date   Depression    Genital herpes    Thyroid disease    UTI (urinary tract infection)     Patient Active Problem List   Diagnosis Date Noted   Admission for sterilization 03/09/2021   Mood disorder (HCC) 06/27/2018   Current severe episode of major depressive disorder without psychotic features without prior episode (HCC) 10/14/2017   Class 1 obesity due to excess calories without serious comorbidity with body mass index (BMI) of 30.0 to 30.9 in adult 10/14/2017   Pure hypercholesterolemia 10/14/2017   Displacement of intrauterine contraceptive device 04/10/2016   Thyroid enlargement 11/30/2014   Genital herpes simplex type 2 10/12/2014    Past Surgical History:  Procedure Laterality Date   INTRAUTERINE DEVICE (IUD) INSERTION  2018   INTRAUTERINE DEVICE (IUD) INSERTION N/A 04/26/2016   Procedure: INTRAUTERINE DEVICE (IUD) INSERTION;  Surgeon: Conard Novak, MD;  Location: [facility];  Service: Gynecology;  Laterality: N/A;  lot tu01sce exp 09/012020 mirena   IUD REMOVAL N/A 04/26/2016   Procedure: INTRAUTERINE DEVICE (IUD) REMOVAL;  Surgeon: Conard Novak, MD;  Location: [facility];  Service: Gynecology;  Laterality: N/A;   LAPAROSCOPIC BILATERAL SALPINGECTOMY Bilateral 03/09/2021   Procedure: LAPAROSCOPY WITH TUBAL PARTIAL SALPINGECTOMY;  Surgeon: Nadara Mustard, MD;  Location: [facility];  Service: Gynecology;   Laterality: Bilateral;   LAPAROSCOPY N/A 04/26/2016   Procedure: LAPAROSCOPY OPERATIVE;  Surgeon: Conard Novak, MD;  Location: [facility];  Service: Gynecology;  Laterality: N/A;   nexplanon  2020    OB History     Gravida  4   Para  3   Term  3   Preterm      AB  1   Living  3      SAB  1   IAB      Ectopic      Multiple  0   Live Births  3            Home Medications    Prior to Admission medications   Medication Sig Start Date End Date Taking? Authorizing Provider  benzonatate (TESSALON) 100 MG capsule Take 2 capsules (200 mg total) by mouth every 8 (eight) hours. 10/16/21  Yes Becky Augusta, NP  ipratropium (ATROVENT) 0.06 % nasal spray Place 2 sprays into both nostrils 4 (four) times daily. 10/16/21  Yes Becky Augusta, NP  promethazine-dextromethorphan (PROMETHAZINE-DM) 6.25-15 MG/5ML syrup Take 5 mLs by mouth 4 (four) times daily as needed. 10/16/21  Yes Becky Augusta, NP  ondansetron (ZOFRAN-ODT) 4 MG disintegrating tablet Take 1 tablet (4 mg total) by mouth every 8 (eight) hours as needed for nausea or vomiting. 08/11/21   Menshew, Charlesetta Ivory, PA-C  sertraline (ZOLOFT) 50 MG tablet Take 1 tablet (50 mg total) by mouth daily. Start by taking 1/2 pill daily for one week Patient not taking:  Reported on 07/27/2021 02/23/21   Nadara Mustard, MD    Family History Family History  Problem Relation Age of Onset   Diabetes Maternal Grandfather     Social History Social History   Tobacco Use   Smoking status: Never   Smokeless tobacco: Never  Vaping Use   Vaping Use: Never used  Substance Use Topics   Alcohol use: No   Drug use: No     Allergies   Patient has no known allergies.   Review of Systems Review of Systems  Constitutional:  Negative for fever.  HENT:  Positive for congestion, rhinorrhea and sore throat.   Respiratory:  Positive for cough. Negative for shortness of breath and wheezing.   Gastrointestinal:  Negative for diarrhea,  nausea and vomiting.  Skin:  Negative for rash.  Hematological: Negative.   Psychiatric/Behavioral: Negative.       Physical Exam Triage Vital Signs ED Triage Vitals  Enc Vitals Group     BP      Pulse      Resp      Temp      Temp src      SpO2      Weight      Height      Head Circumference      Peak Flow      Pain Score      Pain Loc      Pain Edu?      Excl. in GC?    No data found.  Updated Vital Signs BP 135/78 (BP Location: Left Arm)   Pulse 86   Temp 98.4 F (36.9 C) (Oral)   Resp 16   LMP 10/15/2021   SpO2 100%   Visual Acuity Right Eye Distance:   Left Eye Distance:   Bilateral Distance:    Right Eye Near:   Left Eye Near:    Bilateral Near:     Physical Exam Vitals and nursing note reviewed.  Constitutional:      Appearance: Normal appearance. She is not ill-appearing.  HENT:     Head: Normocephalic and atraumatic.     Right Ear: Tympanic membrane, ear canal and external ear normal. There is no impacted cerumen.     Left Ear: Tympanic membrane, ear canal and external ear normal. There is no impacted cerumen.     Nose: Congestion and rhinorrhea present.     Mouth/Throat:     Mouth: Mucous membranes are moist.     Pharynx: Oropharynx is clear. Posterior oropharyngeal erythema present. No oropharyngeal exudate.  Cardiovascular:     Rate and Rhythm: Normal rate and regular rhythm.     Pulses: Normal pulses.     Heart sounds: Normal heart sounds. No murmur heard.    No friction rub. No gallop.  Pulmonary:     Effort: Pulmonary effort is normal.     Breath sounds: Normal breath sounds. No wheezing, rhonchi or rales.  Musculoskeletal:     Cervical back: Normal range of motion and neck supple.  Lymphadenopathy:     Cervical: No cervical adenopathy.  Skin:    General: Skin is warm and dry.     Capillary Refill: Capillary refill takes less than 2 seconds.     Findings: No erythema or rash.  Neurological:     General: No focal deficit  present.     Mental Status: She is alert and oriented to person, place, and time.  Psychiatric:  Mood and Affect: Mood normal.        Behavior: Behavior normal.        Thought Content: Thought content normal.        Judgment: Judgment normal.      UC Treatments / Results  Labs (all labs ordered are listed, but only abnormal results are displayed) Labs Reviewed  SARS CORONAVIRUS 2 BY RT PCR  NOVEL CORONAVIRUS, NAA    EKG   Radiology No results found.  Procedures Procedures (including critical care time)  Medications Ordered in UC Medications - No data to display  Initial Impression / Assessment and Plan / UC Course  I have reviewed the triage vital signs and the nursing notes.  Pertinent labs & imaging results that were available during my care of the patient were reviewed by me and considered in my medical decision making (see chart for details).   Patient is a nontoxic-appearing 36 year old female here for evaluation of sore throat, runny nose, and nonproductive cough that is been going on for the last 3 days.  On exam patient has pearly-gray tympanic membranes bilaterally with normal reflex and clear external auditory canals.  Nasal mucosa is erythematous and edematous with clear discharge in both nares.  Oropharyngeal exam reveals posterior oropharyngeal erythema with clear postnasal drip.  No anterior cervical lymphadenopathy appreciable exam.  Cardiopulmonary exam reveals clear lung sounds in all fields.  Patient exam is consistent with an upper respiratory infection and I will order a COVID PCR given her exposure.  COVID PCR is negative.  I will discharge patient home with a diagnosis of viral URI with cough.  I will treat her with Atrovent nasal spray, Tessalon Perles, Promethazine DM cough syrup.   Final Clinical Impressions(s) / UC Diagnoses   Final diagnoses:  Viral URI with cough     Discharge Instructions      Your testing today was negative for  COVID.  I believe you have a viral respiratory infection.  Use Tylenol and ibuprofen according to package instructions as needed for any aches or pains you may have.  Use the Atrovent nasal spray, 2 squirts in each nostril every 6 hours, as needed for runny nose and postnasal drip.  Use the Tessalon Perles every 8 hours during the day.  Take them with a small sip of water.  They may give you some numbness to the base of your tongue or a metallic taste in your mouth, this is normal.  Use the Promethazine DM cough syrup at bedtime for cough and congestion.  It will make you drowsy so do not take it during the day.  Return for reevaluation or see your primary care provider for any new or worsening symptoms.      ED Prescriptions     Medication Sig Dispense Auth. Provider   benzonatate (TESSALON) 100 MG capsule Take 2 capsules (200 mg total) by mouth every 8 (eight) hours. 21 capsule Margarette Canada, NP   ipratropium (ATROVENT) 0.06 % nasal spray Place 2 sprays into both nostrils 4 (four) times daily. 15 mL Margarette Canada, NP   promethazine-dextromethorphan (PROMETHAZINE-DM) 6.25-15 MG/5ML syrup Take 5 mLs by mouth 4 (four) times daily as needed. 118 mL Margarette Canada, NP      PDMP not reviewed this encounter.   Margarette Canada, NP 10/16/21 2042

## 2021-10-16 NOTE — Discharge Instructions (Addendum)
Your testing today was negative for COVID.  I believe you have a viral respiratory infection.  Use Tylenol and ibuprofen according to package instructions as needed for any aches or pains you may have.  Use the Atrovent nasal spray, 2 squirts in each nostril every 6 hours, as needed for runny nose and postnasal drip.  Use the Tessalon Perles every 8 hours during the day.  Take them with a small sip of water.  They may give you some numbness to the base of your tongue or a metallic taste in your mouth, this is normal.  Use the Promethazine DM cough syrup at bedtime for cough and congestion.  It will make you drowsy so do not take it during the day.  Return for reevaluation or see your primary care provider for any new or worsening symptoms.

## 2022-01-12 ENCOUNTER — Encounter: Payer: Self-pay | Admitting: Emergency Medicine

## 2022-01-12 ENCOUNTER — Ambulatory Visit
Admission: EM | Admit: 2022-01-12 | Discharge: 2022-01-12 | Disposition: A | Discharge status: Home / Self Care | Payer: Medicaid Other | Attending: Family Medicine | Admitting: Family Medicine

## 2022-01-12 DIAGNOSIS — Z1152 Encounter for screening for COVID-19: Secondary | ICD-10-CM | POA: Insufficient documentation

## 2022-01-12 DIAGNOSIS — J101 Influenza due to other identified influenza virus with other respiratory manifestations: Secondary | ICD-10-CM

## 2022-01-12 LAB — RESP PANEL BY RT-PCR (RSV, FLU A&B, COVID)  RVPGX2
Influenza A by PCR: NEGATIVE
Influenza B by PCR: POSITIVE — AB
Resp Syncytial Virus by PCR: NEGATIVE
SARS Coronavirus 2 by RT PCR: NEGATIVE

## 2022-01-12 LAB — GROUP A STREP BY PCR: Group A Strep by PCR: NOT DETECTED

## 2022-01-12 MED ORDER — OSELTAMIVIR PHOSPHATE 75 MG PO CAPS: 75.0000 mg | ORAL_CAPSULE | Freq: Two times a day (BID) | ORAL | 0 refills | Status: AC

## 2022-01-12 MED ORDER — PROMETHAZINE-DM 6.25-15 MG/5ML PO SYRP: 5.0000 mL | ORAL_SOLUTION | Freq: Four times a day (QID) | ORAL | 0 refills | Status: AC | PRN

## 2022-01-12 MED ORDER — ACETAMINOPHEN 500 MG PO TABS
1000.0000 mg | ORAL_TABLET | Freq: Once | ORAL | Status: AC
  Administered 2022-01-12: 1000 mg via ORAL

## 2022-01-12 MED ORDER — ACETAMINOPHEN 500 MG PO TABS: 1000.0000 mg | ORAL_TABLET | Freq: Once | ORAL | Status: DC

## 2022-01-12 NOTE — Discharge Instructions (Addendum)
You tested positive for influenza B. You have been prescribed Tamiflu 75 mg  Your COVID, RSV, and Strep Test are all negative. You may have an Upper Respiratory Infection We encourage conservative treatment with symptom relief. We encourage you to use Tylenol alternating with Ibuprofen for your fever if not contraindicated. (Remember to use as directed do not exceed daily dosing recommendations) We also encourage salt water gargles for your sore throat. You should also consider throat lozenges and chloraseptic spray.  Your cough can be soothed with a cough suppressant. We have prescribed you a cough suppressant to be taken as  directed. Promethazine DM four times a day

## 2022-01-12 NOTE — ED Triage Notes (Signed)
Patient c/o cough, runny nose, and sore throat that started 3 days ago.  Patient unsure of fevers.

## 2022-01-12 NOTE — ED Provider Notes (Addendum)
[facility]    CSN: [id number] Arrival date & time: 01/12/22  0858      History   Chief Complaint Chief Complaint  Patient presents with   Cough   Sore Throat    HPI Kari Frost is a 36 y.o. female.   HPI  She is in today complaining of cough, and sore throatf or 3 days .  She reports exposure positive RSV. Denies fever, chills, nasal congestion, sneezing, runny nose,  new sore throat, new loss of smell or taste, shortness of breath, chest pain, nausea, or diarrhea. This has been going on . The current treatment has been OTC APAP Past Medical History:  Diagnosis Date   Depression    Genital herpes    Thyroid disease    UTI (urinary tract infection)     Patient Active Problem List   Diagnosis Date Noted   Admission for sterilization 03/09/2021   Mood disorder (HCC) 06/27/2018   Current severe episode of major depressive disorder without psychotic features without prior episode (HCC) 10/14/2017   Class 1 obesity due to excess calories without serious comorbidity with body mass index (BMI) of 30.0 to 30.9 in adult 10/14/2017   Pure hypercholesterolemia 10/14/2017   Displacement of intrauterine contraceptive device 04/10/2016   Thyroid enlargement 11/30/2014   Genital herpes simplex type 2 10/12/2014    Past Surgical History:  Procedure Laterality Date   INTRAUTERINE DEVICE (IUD) INSERTION  2018   INTRAUTERINE DEVICE (IUD) INSERTION N/A 04/26/2016   Procedure: INTRAUTERINE DEVICE (IUD) INSERTION;  Surgeon: Conard Novak, MD;  Location: [facility];  Service: Gynecology;  Laterality: N/A;  lot tu01sce exp 09/012020 mirena   IUD REMOVAL N/A 04/26/2016   Procedure: INTRAUTERINE DEVICE (IUD) REMOVAL;  Surgeon: Conard Novak, MD;  Location: [facility];  Service: Gynecology;  Laterality: N/A;   LAPAROSCOPIC BILATERAL SALPINGECTOMY Bilateral 03/09/2021   Procedure: LAPAROSCOPY WITH TUBAL PARTIAL SALPINGECTOMY;  Surgeon: Nadara Mustard, MD;  Location:  [facility];  Service: Gynecology;  Laterality: Bilateral;   LAPAROSCOPY N/A 04/26/2016   Procedure: LAPAROSCOPY OPERATIVE;  Surgeon: Conard Novak, MD;  Location: [facility];  Service: Gynecology;  Laterality: N/A;   nexplanon  2020    OB History     Gravida  4   Para  3   Term  3   Preterm      AB  1   Living  3      SAB  1   IAB      Ectopic      Multiple  0   Live Births  3            Home Medications    Prior to Admission medications   Medication Sig Start Date End Date Taking? Authorizing Provider  oseltamivir (TAMIFLU) 75 MG capsule Take 1 capsule (75 mg total) by mouth every 12 (twelve) hours. 01/12/22  Yes Barbette Merino, NP  promethazine-dextromethorphan (PROMETHAZINE-DM) 6.25-15 MG/5ML syrup Take 5 mLs by mouth 4 (four) times daily as needed for cough. 01/12/22  Yes Barbette Merino, NP    Family History Family History  Problem Relation Age of Onset   Diabetes Maternal Grandfather     Social History Social History   Tobacco Use   Smoking status: Never   Smokeless tobacco: Never  Vaping Use   Vaping Use: Never used  Substance Use Topics   Alcohol use: No   Drug use: No     Allergies   Patient  has no known allergies.   Review of Systems Review of Systems   Physical Exam Triage Vital Signs ED Triage Vitals  Enc Vitals Group     BP      Pulse      Resp      Temp      Temp src      SpO2      Weight      Height      Head Circumference      Peak Flow      Pain Score      Pain Loc      Pain Edu?      Excl. in GC?    No data found.  Updated Vital Signs BP 106/65 (BP Location: Left Arm)   Pulse (!) 113   Temp (!) 102.7 F (39.3 C) (Oral)   Resp 14   Ht 5\' 5"  (1.651 m)   Wt 170 lb (77.1 kg)   LMP 12/29/2021 (Approximate)   SpO2 100%   Breastfeeding No   BMI 28.29 kg/m   Visual Acuity Right Eye Distance:   Left Eye Distance:   Bilateral Distance:    Right Eye Near:   Left Eye Near:    Bilateral Near:      Physical Exam Constitutional:      General: She is in acute distress (cough coarse).  HENT:     Head: Normocephalic.     Right Ear: Tympanic membrane normal.     Left Ear: Tympanic membrane normal.     Nose: No congestion or rhinorrhea.     Mouth/Throat:     Mouth: Mucous membranes are moist.     Tonsils: No tonsillar exudate.  Eyes:     Conjunctiva/sclera: Conjunctivae normal.  Cardiovascular:     Heart sounds: Normal heart sounds.  Pulmonary:     Effort: Pulmonary effort is normal.  Musculoskeletal:     Cervical back: Normal range of motion.  Skin:    General: Skin is warm and dry.     Capillary Refill: Capillary refill takes less than 2 seconds.  Neurological:     General: No focal deficit present.     Mental Status: She is alert.      UC Treatments / Results  Labs (all labs ordered are listed, but only abnormal results are displayed) Labs Reviewed  RESP PANEL BY RT-PCR (RSV, FLU A&B, COVID)  [id number] - Abnormal; Notable for the following components:      Result Value   Influenza B by PCR POSITIVE (*)    All other components within normal limits  GROUP A STREP BY PCR    EKG   Radiology No results found.  Procedures Procedures (including critical care time)  Medications Ordered in UC Medications  acetaminophen (TYLENOL) tablet 1,000 mg (1,000 mg Oral Given 01/12/22 0950)    Initial Impression / Assessment and Plan / UC Course  I have reviewed the triage vital signs and the nursing notes.  Pertinent labs & imaging results that were available during my care of the patient were reviewed by me and considered in my medical decision making (see chart for details).  Clinical Course as of 01/12/22 1046  Fri Jan 12, 2022  1036 Influenza B By PCR(!): POSITIVE [CK]  1039 Resp panel by RT-PCR (RSV, Flu A&B, Covid) Anterior Nasal Swab(!) [CK]    Clinical Course User Index [CK] Jan 14, 2022, NP   Sore throat Final Clinical Impressions(s) / UC Diagnoses  Final diagnoses:  Influenza B     Discharge Instructions      You tested positive for influenza B. You have been prescribed Tamiflu 75 mg  Your COVID, RSV, and Strep Test are all negative. You may have an Upper Respiratory Infection We encourage conservative treatment with symptom relief. We encourage you to use Tylenol alternating with Ibuprofen for your fever if not contraindicated. (Remember to use as directed do not exceed daily dosing recommendations) We also encourage salt water gargles for your sore throat. You should also consider throat lozenges and chloraseptic spray.  Your cough can be soothed with a cough suppressant. We have prescribed you a cough suppressant to be taken as  directed. Promethazine DM four times a day       ED Prescriptions     Medication Sig Dispense Auth. Provider   promethazine-dextromethorphan (PROMETHAZINE-DM) 6.25-15 MG/5ML syrup Take 5 mLs by mouth 4 (four) times daily as needed for cough. 118 mL Barbette Merino, NP   oseltamivir (TAMIFLU) 75 MG capsule Take 1 capsule (75 mg total) by mouth every 12 (twelve) hours. 10 capsule Barbette Merino, NP      PDMP not reviewed this encounter.   Barbette Merino, NP 01/12/22 1046    Thad Ranger [place], [place] 01/12/22 1459

## 2022-09-10 ENCOUNTER — Emergency Department
Admission: EM | Admit: 2022-09-10 | Discharge: 2022-09-10 | Disposition: A | Discharge status: Home / Self Care | Payer: Self-pay | Attending: Emergency Medicine | Admitting: Emergency Medicine

## 2022-09-10 ENCOUNTER — Other Ambulatory Visit: Payer: Self-pay

## 2022-09-10 DIAGNOSIS — N3090 Cystitis, unspecified without hematuria: Secondary | ICD-10-CM | POA: Insufficient documentation

## 2022-09-10 DIAGNOSIS — R110 Nausea: Secondary | ICD-10-CM | POA: Insufficient documentation

## 2022-09-10 DIAGNOSIS — R630 Anorexia: Secondary | ICD-10-CM | POA: Insufficient documentation

## 2022-09-10 LAB — COMPREHENSIVE METABOLIC PANEL
ALT: 15 U/L (ref 0–44)
AST: 15 U/L (ref 15–41)
Albumin: 4 g/dL (ref 3.5–5.0)
Alkaline Phosphatase: 60 U/L (ref 38–126)
Anion gap: 8 (ref 5–15)
BUN: 9 mg/dL (ref 6–20)
CO2: 21 mmol/L — ABNORMAL LOW (ref 22–32)
Calcium: 8.1 mg/dL — ABNORMAL LOW (ref 8.9–10.3)
Chloride: 104 mmol/L (ref 98–111)
Creatinine, Ser: 0.72 mg/dL (ref 0.44–1.00)
GFR, Estimated: >60 mL/min (ref >60)
Glucose, Bld: 102 mg/dL — ABNORMAL HIGH (ref 70–99)
Potassium: 3.5 mmol/L (ref 3.5–5.1)
Sodium: 133 mmol/L — ABNORMAL LOW (ref 135–145)
Total Bilirubin: 1.1 mg/dL (ref 0.3–1.2)
Total Protein: 7.6 g/dL (ref 6.5–8.1)

## 2022-09-10 LAB — URINALYSIS, ROUTINE W REFLEX MICROSCOPIC
Bilirubin Urine: NEGATIVE
Glucose, UA: NEGATIVE mg/dL
Hgb urine dipstick: NEGATIVE
Ketones, ur: 5 mg/dL — AB
Nitrite: NEGATIVE
Protein, ur: 100 mg/dL — AB
Specific Gravity, Urine: 1.032 — ABNORMAL HIGH (ref 1.005–1.030)
WBC, UA: >50 WBC/hpf (ref 0–5)
pH: 5 (ref 5.0–8.0)

## 2022-09-10 LAB — CBC
HCT: 39.9 % (ref 36.0–46.0)
Hemoglobin: 13.2 g/dL (ref 12.0–15.0)
MCH: 29.8 pg (ref 26.0–34.0)
MCHC: 33.1 g/dL (ref 30.0–36.0)
MCV: 90.1 fL (ref 80.0–100.0)
Platelets: 251 10*3/uL (ref 150–400)
RBC: 4.43 MIL/uL (ref 3.87–5.11)
RDW: 12.9 % (ref 11.5–15.5)
WBC: 8.8 10*3/uL (ref 4.0–10.5)
nRBC: 0 % (ref 0.0–0.2)

## 2022-09-10 LAB — PREGNANCY, URINE: Preg Test, Ur: NEGATIVE

## 2022-09-10 LAB — LIPASE, BLOOD: Lipase: 33 U/L (ref 11–51)

## 2022-09-10 MED ORDER — CEPHALEXIN 500 MG PO CAPS: 500.0000 mg | ORAL_CAPSULE | Freq: Two times a day (BID) | ORAL | 0 refills | Status: AC

## 2022-09-10 MED ORDER — ONDANSETRON 4 MG PO TBDP
8.0000 mg | ORAL_TABLET | Freq: Once | ORAL | Status: AC
  Administered 2022-09-10: 8 mg via ORAL
  Filled 2022-09-10: qty 2

## 2022-09-10 MED ORDER — CEPHALEXIN 500 MG PO CAPS
500.0000 mg | ORAL_CAPSULE | Freq: Once | ORAL | Status: AC
  Administered 2022-09-10: 500 mg via ORAL
  Filled 2022-09-10: qty 1

## 2022-09-10 NOTE — ED Triage Notes (Signed)
Pt to ED via POV c/o lower abd pain and possible UTI. Pt reports for past week pt has been having trouble peeing, states only dribbles come out and is having more urgency to urinate. Pt reports fever earlier, no fever in triage. Lower abd pain started today, described as an ache. Pt reports having nausea and diarrhea today, no vomiting. Denies CP, SOB, dizziness.

## 2022-09-10 NOTE — ED Provider Notes (Signed)
[facility] Provider Note    Event Date/Time   First MD Initiated Contact with Patient 09/10/22 2129     (approximate)   History   Chief Complaint: Abdominal Pain   HPI  Kari Frost is a 37 y.o. female with no significant past medical history who comes ED complaining of dysuria and urinary frequency for the past week, gradually worsening.  Also decreased appetite today with nausea.  Also reports subjective fever earlier today.  Works in a childcare center.     Physical Exam   Triage Vital Signs: ED Triage Vitals  Encounter Vitals Group     BP 09/10/22 2124 (!) 142/89     Systolic BP Percentile --      Diastolic BP Percentile --      Pulse Rate 09/10/22 2124 (!) 106     Resp 09/10/22 2124 17     Temp 09/10/22 2124 98.9 F (37.2 C)     Temp Source 09/10/22 2124 Oral     SpO2 09/10/22 2124 99 %     Weight 09/10/22 2125 175 lb (79.4 kg)     Height 09/10/22 2125 5\' 5"  (1.651 m)     Head Circumference --      Peak Flow --      Pain Score 09/10/22 2125 7     Pain Loc --      Pain Education --      Exclude from Growth Chart --     Most recent vital signs: Vitals:   09/10/22 2124  BP: (!) 142/89  Pulse: (!) 106  Resp: 17  Temp: 98.9 F (37.2 C)  SpO2: 99%    General: Awake, no distress.  CV:  Good peripheral perfusion.  Regular rate rhythm Resp:  Normal effort.  Clear to auscultation bilaterally Abd:  No distention.  Soft with suprapubic tenderness.  No CVA tenderness Other:  Moist oral mucosa.  No lower extremity edema   ED Results / Procedures / Treatments   Labs (all labs ordered are listed, but only abnormal results are displayed) Labs Reviewed  COMPREHENSIVE METABOLIC PANEL - Abnormal; Notable for the following components:      Result Value   Sodium 133 (*)    CO2 21 (*)    Glucose, Bld 102 (*)    Calcium 8.1 (*)    All other components within normal limits  URINALYSIS, ROUTINE W REFLEX MICROSCOPIC - Abnormal;  Notable for the following components:   Color, Urine AMBER (*)    APPearance CLOUDY (*)    Specific Gravity, Urine 1.032 (*)    Ketones, ur 5 (*)    Protein, ur 100 (*)    Leukocytes,Ua MODERATE (*)    Bacteria, UA RARE (*)    Non Squamous Epithelial PRESENT (*)    All other components within normal limits  LIPASE, BLOOD  CBC  PREGNANCY, URINE     EKG    RADIOLOGY    PROCEDURES:  Procedures   MEDICATIONS ORDERED IN ED: Medications  cephALEXin (KEFLEX) capsule 500 mg (has no administration in time range)  ondansetron (ZOFRAN-ODT) disintegrating tablet 8 mg (8 mg Oral Given 09/10/22 2148)     IMPRESSION / MDM / ASSESSMENT AND PLAN / ED COURSE  I reviewed the triage vital signs and the nursing notes.  DDx: Cystitis, ureterolithiasis, dehydration, AKI, electrolyte normality, pregnancy.  Doubt pyelonephritis or sepsis.  Doubt appendicitis diverticulitis STI PID TOA torsion or bowel perforation.  Patient's presentation is most consistent with  acute presentation with potential threat to life or bodily function.  Patient presents with suprapubic pain and dysuria.  Overall exam is reassuring she is nontoxic.  Labs unremarkable except for UA which is consistent with UTI.  Will start on Keflex.       FINAL CLINICAL IMPRESSION(S) / ED DIAGNOSES   Final diagnoses:  Cystitis     Rx / DC Orders   ED Discharge Orders          Ordered    Ambulatory Referral to Primary Care (Establish Care)        09/10/22 2203    cephALEXin (KEFLEX) 500 MG capsule  2 times daily        09/10/22 2204             Note:  This document was prepared using Dragon voice recognition software and may include unintentional dictation errors.   Sharman Cheek, MD 09/10/22 2208

## 2022-09-10 NOTE — ED Notes (Signed)
Pt verbalizes understanding of discharge instructions. Opportunity for questioning and answers were provided. Pt discharged from ED to home.   ? ?

## 2022-11-06 ENCOUNTER — Other Ambulatory Visit: Payer: Self-pay

## 2022-11-06 ENCOUNTER — Encounter: Payer: Self-pay | Admitting: Emergency Medicine

## 2022-11-06 DIAGNOSIS — K8012 Calculus of gallbladder with acute and chronic cholecystitis without obstruction: Principal | ICD-10-CM | POA: Insufficient documentation

## 2022-11-06 NOTE — ED Triage Notes (Signed)
Patient ambulatory to triage with steady gait, without difficulty or distress noted; pt reports since this evening having mid CP radiating into back accomp by N/V

## 2022-11-07 ENCOUNTER — Emergency Department: Payer: Self-pay

## 2022-11-07 ENCOUNTER — Observation Stay: Payer: Self-pay | Admitting: Anesthesiology

## 2022-11-07 ENCOUNTER — Ambulatory Visit: Admit: 2022-11-07 | Payer: Medicaid Other | Admitting: General Surgery

## 2022-11-07 ENCOUNTER — Encounter
Admission: EM | Disposition: A | Discharge status: Home / Self Care | Payer: Self-pay | Source: Home / Self Care | Attending: Emergency Medicine

## 2022-11-07 ENCOUNTER — Other Ambulatory Visit: Payer: Self-pay

## 2022-11-07 ENCOUNTER — Observation Stay
Admission: EM | Admit: 2022-11-07 | Discharge: 2022-11-08 | Disposition: A | Discharge status: Home / Self Care | Payer: Self-pay | Attending: General Surgery | Admitting: General Surgery

## 2022-11-07 ENCOUNTER — Encounter: Payer: Self-pay | Admitting: General Surgery

## 2022-11-07 DIAGNOSIS — K810 Acute cholecystitis: Principal | ICD-10-CM | POA: Diagnosis present

## 2022-11-07 LAB — URINALYSIS, ROUTINE W REFLEX MICROSCOPIC
Bacteria, UA: NONE SEEN
Bilirubin Urine: NEGATIVE
Glucose, UA: NEGATIVE mg/dL
Ketones, ur: NEGATIVE mg/dL
Nitrite: NEGATIVE
Protein, ur: NEGATIVE mg/dL
Specific Gravity, Urine: 1.029 (ref 1.005–1.030)
pH: 5 (ref 5.0–8.0)

## 2022-11-07 LAB — COMPREHENSIVE METABOLIC PANEL
ALT: 16 U/L (ref 0–44)
AST: 15 U/L (ref 15–41)
Albumin: 4.2 g/dL (ref 3.5–5.0)
Alkaline Phosphatase: 58 U/L (ref 38–126)
Anion gap: 8 (ref 5–15)
BUN: 10 mg/dL (ref 6–20)
CO2: 23 mmol/L (ref 22–32)
Calcium: 8.6 mg/dL — ABNORMAL LOW (ref 8.9–10.3)
Chloride: 106 mmol/L (ref 98–111)
Creatinine, Ser: 0.67 mg/dL (ref 0.44–1.00)
GFR, Estimated: >60 mL/min (ref >60)
Glucose, Bld: 124 mg/dL — ABNORMAL HIGH (ref 70–99)
Potassium: 3.7 mmol/L (ref 3.5–5.1)
Sodium: 137 mmol/L (ref 135–145)
Total Bilirubin: 0.4 mg/dL (ref 0.3–1.2)
Total Protein: 7.5 g/dL (ref 6.5–8.1)

## 2022-11-07 LAB — POC URINE PREG, ED: Preg Test, Ur: NEGATIVE

## 2022-11-07 LAB — CBC WITH DIFFERENTIAL/PLATELET
Abs Immature Granulocytes: 0.02 10*3/uL (ref 0.00–0.07)
Basophils Absolute: 0 10*3/uL (ref 0.0–0.1)
Basophils Relative: 0 %
Eosinophils Absolute: 0.2 10*3/uL (ref 0.0–0.5)
Eosinophils Relative: 2 %
HCT: 37.4 % (ref 36.0–46.0)
Hemoglobin: 12.5 g/dL (ref 12.0–15.0)
Immature Granulocytes: 0 %
Lymphocytes Relative: 33 %
Lymphs Abs: 3.2 10*3/uL (ref 0.7–4.0)
MCH: 29.5 pg (ref 26.0–34.0)
MCHC: 33.4 g/dL (ref 30.0–36.0)
MCV: 88.2 fL (ref 80.0–100.0)
Monocytes Absolute: 0.5 10*3/uL (ref 0.1–1.0)
Monocytes Relative: 5 %
Neutro Abs: 5.9 10*3/uL (ref 1.7–7.7)
Neutrophils Relative %: 60 %
Platelets: 262 10*3/uL (ref 150–400)
RBC: 4.24 MIL/uL (ref 3.87–5.11)
RDW: 13.1 % (ref 11.5–15.5)
WBC: 9.9 10*3/uL (ref 4.0–10.5)
nRBC: 0 % (ref 0.0–0.2)

## 2022-11-07 LAB — TROPONIN I (HIGH SENSITIVITY): Troponin I (High Sensitivity): <2 ng/L (ref <18)

## 2022-11-07 LAB — LIPASE, BLOOD: Lipase: 34 U/L (ref 11–51)

## 2022-11-07 LAB — HIV ANTIBODY (ROUTINE TESTING W REFLEX): HIV Screen 4th Generation wRfx: NONREACTIVE

## 2022-11-07 SURGERY — CHOLECYSTECTOMY, ROBOT-ASSISTED, LAPAROSCOPIC
Anesthesia: General

## 2022-11-07 MED ORDER — DEXMEDETOMIDINE HCL IN NACL 80 MCG/20ML IV SOLN
INTRAVENOUS | Status: DC | PRN
Start: 2022-11-07 — End: 2022-11-07
  Administered 2022-11-07: 16 ug via INTRAVENOUS

## 2022-11-07 MED ORDER — MORPHINE SULFATE (PF) 4 MG/ML IV SOLN
4.0000 mg | INTRAVENOUS | Status: DC | PRN
  Administered 2022-11-08: 4 mg via INTRAVENOUS
  Filled 2022-11-07: qty 1

## 2022-11-07 MED ORDER — BUPIVACAINE-EPINEPHRINE 0.25% -1:200000 IJ SOLN
INTRAMUSCULAR | Status: DC | PRN
  Administered 2022-11-07: 20 mL

## 2022-11-07 MED ORDER — BUPIVACAINE-EPINEPHRINE (PF) 0.25% -1:200000 IJ SOLN
INTRAMUSCULAR | Status: AC
  Filled 2022-11-07: qty 30

## 2022-11-07 MED ORDER — ROCURONIUM BROMIDE 100 MG/10ML IV SOLN
INTRAVENOUS | Status: DC | PRN
  Administered 2022-11-07: 45 mg via INTRAVENOUS

## 2022-11-07 MED ORDER — LIDOCAINE HCL (PF) 2 % IJ SOLN
INTRAMUSCULAR | Status: AC
  Filled 2022-11-07: qty 5

## 2022-11-07 MED ORDER — FENTANYL CITRATE (PF) 100 MCG/2ML IJ SOLN
INTRAMUSCULAR | Status: AC
  Filled 2022-11-07: qty 2

## 2022-11-07 MED ORDER — CEFAZOLIN SODIUM 1 G IJ SOLR
INTRAMUSCULAR | Status: AC
  Filled 2022-11-07: qty 20

## 2022-11-07 MED ORDER — ONDANSETRON HCL 4 MG/2ML IJ SOLN: 4.0000 mg | Freq: Once | INTRAMUSCULAR | Status: DC | PRN

## 2022-11-07 MED ORDER — PIPERACILLIN-TAZOBACTAM 3.375 G IVPB 30 MIN: 3.3750 g | Freq: Once | INTRAVENOUS | Status: DC

## 2022-11-07 MED ORDER — OXYCODONE HCL 5 MG PO TABS
5.0000 mg | ORAL_TABLET | Freq: Once | ORAL | Status: AC
  Administered 2022-11-07: 5 mg via ORAL

## 2022-11-07 MED ORDER — HYDROMORPHONE HCL 1 MG/ML IJ SOLN
INTRAMUSCULAR | Status: AC
  Filled 2022-11-07: qty 1

## 2022-11-07 MED ORDER — ROCURONIUM BROMIDE 10 MG/ML (PF) SYRINGE
PREFILLED_SYRINGE | INTRAVENOUS | Status: AC
  Filled 2022-11-07: qty 10

## 2022-11-07 MED ORDER — CHLORHEXIDINE GLUCONATE 0.12 % MT SOLN
OROMUCOSAL | Status: AC
  Filled 2022-11-07: qty 15

## 2022-11-07 MED ORDER — ORAL CARE MOUTH RINSE
15.0000 mL | Freq: Once | OROMUCOSAL | Status: AC
  Filled 2022-11-07: qty 15

## 2022-11-07 MED ORDER — KETOROLAC TROMETHAMINE 30 MG/ML IJ SOLN
INTRAMUSCULAR | Status: DC | PRN
  Administered 2022-11-07: 30 mg via INTRAVENOUS

## 2022-11-07 MED ORDER — FENTANYL CITRATE PF 50 MCG/ML IJ SOSY: 50.0000 ug | PREFILLED_SYRINGE | Freq: Once | INTRAMUSCULAR | Status: DC

## 2022-11-07 MED ORDER — DEXAMETHASONE SODIUM PHOSPHATE 10 MG/ML IJ SOLN
INTRAMUSCULAR | Status: DC | PRN
  Administered 2022-11-07: 10 mg via INTRAVENOUS

## 2022-11-07 MED ORDER — KETOROLAC TROMETHAMINE 30 MG/ML IJ SOLN
INTRAMUSCULAR | Status: AC
  Filled 2022-11-07: qty 1

## 2022-11-07 MED ORDER — METOCLOPRAMIDE HCL 5 MG/ML IJ SOLN: 10.0000 mg | Freq: Once | INTRAMUSCULAR | Status: DC

## 2022-11-07 MED ORDER — CEFAZOLIN SODIUM-DEXTROSE 2-3 GM-%(50ML) IV SOLR
INTRAVENOUS | Status: DC | PRN
Start: 2022-11-07 — End: 2022-11-07
  Administered 2022-11-07: 2 g via INTRAVENOUS

## 2022-11-07 MED ORDER — ONDANSETRON 4 MG PO TBDP
4.0000 mg | ORAL_TABLET | Freq: Once | ORAL | Status: AC
  Administered 2022-11-07: 4 mg via ORAL
  Filled 2022-11-07: qty 1

## 2022-11-07 MED ORDER — ACETAMINOPHEN 325 MG PO TABS: 650.0000 mg | ORAL_TABLET | Freq: Four times a day (QID) | ORAL | Status: DC | PRN

## 2022-11-07 MED ORDER — MIDAZOLAM HCL 2 MG/2ML IJ SOLN
INTRAMUSCULAR | Status: DC | PRN
  Administered 2022-11-07: 2 mg via INTRAVENOUS

## 2022-11-07 MED ORDER — SUCRALFATE 1 G PO TABS
1.0000 g | ORAL_TABLET | Freq: Once | ORAL | Status: DC
  Filled 2022-11-07: qty 1

## 2022-11-07 MED ORDER — FENTANYL CITRATE (PF) 100 MCG/2ML IJ SOLN
25.0000 ug | INTRAMUSCULAR | Status: DC | PRN
  Administered 2022-11-07 (×6): 25 ug via INTRAVENOUS

## 2022-11-07 MED ORDER — PIPERACILLIN-TAZOBACTAM 3.375 G IVPB
3.3750 g | Freq: Three times a day (TID) | INTRAVENOUS | Status: DC
  Administered 2022-11-07 – 2022-11-08 (×3): 3.375 g via INTRAVENOUS
  Filled 2022-11-07 (×3): qty 50

## 2022-11-07 MED ORDER — ONDANSETRON 4 MG PO TBDP: 4.0000 mg | ORAL_TABLET | Freq: Four times a day (QID) | ORAL | Status: DC | PRN

## 2022-11-07 MED ORDER — OXYCODONE HCL 5 MG PO TABS
ORAL_TABLET | ORAL | Status: AC
  Filled 2022-11-07: qty 1

## 2022-11-07 MED ORDER — HYDROCODONE-ACETAMINOPHEN 5-325 MG PO TABS
ORAL_TABLET | ORAL | Status: AC
  Filled 2022-11-07: qty 2

## 2022-11-07 MED ORDER — DEXAMETHASONE SODIUM PHOSPHATE 10 MG/ML IJ SOLN
INTRAMUSCULAR | Status: AC
  Filled 2022-11-07: qty 1

## 2022-11-07 MED ORDER — ONDANSETRON HCL 4 MG/2ML IJ SOLN
INTRAMUSCULAR | Status: DC | PRN
  Administered 2022-11-07: 4 mg via INTRAVENOUS

## 2022-11-07 MED ORDER — ONDANSETRON HCL 4 MG/2ML IJ SOLN
INTRAMUSCULAR | Status: AC
  Filled 2022-11-07: qty 2

## 2022-11-07 MED ORDER — LACTATED RINGERS IV SOLN: INTRAVENOUS | Status: DC

## 2022-11-07 MED ORDER — PROPOFOL 10 MG/ML IV BOLUS
INTRAVENOUS | Status: DC | PRN
  Administered 2022-11-07: 200 mg via INTRAVENOUS

## 2022-11-07 MED ORDER — ACETAMINOPHEN 10 MG/ML IV SOLN
INTRAVENOUS | Status: AC
  Filled 2022-11-07: qty 100

## 2022-11-07 MED ORDER — ALUM & MAG HYDROXIDE-SIMETH 200-200-20 MG/5ML PO SUSP
30.0000 mL | Freq: Once | ORAL | Status: AC
  Administered 2022-11-07: 30 mL via ORAL
  Filled 2022-11-07: qty 30

## 2022-11-07 MED ORDER — HYDROMORPHONE HCL 1 MG/ML IJ SOLN
INTRAMUSCULAR | Status: DC | PRN
Start: 2022-11-07 — End: 2022-11-07
  Administered 2022-11-07: .5 mg via INTRAVENOUS

## 2022-11-07 MED ORDER — SUGAMMADEX SODIUM 200 MG/2ML IV SOLN
INTRAVENOUS | Status: DC | PRN
Start: 2022-11-07 — End: 2022-11-07
  Administered 2022-11-07: 200 mg via INTRAVENOUS

## 2022-11-07 MED ORDER — ENOXAPARIN SODIUM 40 MG/0.4ML IJ SOSY
40.0000 mg | PREFILLED_SYRINGE | INTRAMUSCULAR | Status: DC
  Administered 2022-11-08: 40 mg via SUBCUTANEOUS
  Filled 2022-11-07: qty 0.4

## 2022-11-07 MED ORDER — FENTANYL CITRATE (PF) 100 MCG/2ML IJ SOLN
INTRAMUSCULAR | Status: DC | PRN
  Administered 2022-11-07 (×2): 50 ug via INTRAVENOUS

## 2022-11-07 MED ORDER — INDOCYANINE GREEN 25 MG IV SOLR
INTRAVENOUS | Status: AC
  Filled 2022-11-07: qty 10

## 2022-11-07 MED ORDER — ACETAMINOPHEN 650 MG RE SUPP: 650.0000 mg | Freq: Four times a day (QID) | RECTAL | Status: DC | PRN

## 2022-11-07 MED ORDER — MIDAZOLAM HCL 2 MG/2ML IJ SOLN
INTRAMUSCULAR | Status: AC
  Filled 2022-11-07: qty 2

## 2022-11-07 MED ORDER — INDOCYANINE GREEN 25 MG IV SOLR
1.2500 mg | Freq: Once | INTRAVENOUS | Status: AC
  Administered 2022-11-07: 1.25 mg via INTRAVENOUS
  Filled 2022-11-07: qty 10

## 2022-11-07 MED ORDER — FAMOTIDINE 20 MG PO TABS
20.0000 mg | ORAL_TABLET | Freq: Once | ORAL | Status: AC
  Administered 2022-11-07: 20 mg via ORAL
  Filled 2022-11-07: qty 1

## 2022-11-07 MED ORDER — ONDANSETRON HCL 4 MG/2ML IJ SOLN: 4.0000 mg | Freq: Four times a day (QID) | INTRAMUSCULAR | Status: DC | PRN

## 2022-11-07 MED ORDER — LIDOCAINE HCL (CARDIAC) PF 100 MG/5ML IV SOSY
PREFILLED_SYRINGE | INTRAVENOUS | Status: DC | PRN
  Administered 2022-11-07: 60 mg via INTRAVENOUS

## 2022-11-07 MED ORDER — HYDROCODONE-ACETAMINOPHEN 5-325 MG PO TABS
1.0000 | ORAL_TABLET | ORAL | Status: DC | PRN
  Administered 2022-11-07 – 2022-11-08 (×3): 2 via ORAL
  Filled 2022-11-07 (×3): qty 2

## 2022-11-07 MED ORDER — SODIUM CHLORIDE 0.9% FLUSH
3.0000 mL | Freq: Two times a day (BID) | INTRAVENOUS | Status: DC
  Administered 2022-11-07 – 2022-11-08 (×2): 3 mL via INTRAVENOUS

## 2022-11-07 MED ORDER — PROPOFOL 10 MG/ML IV BOLUS
INTRAVENOUS | Status: AC
  Filled 2022-11-07: qty 20

## 2022-11-07 MED ORDER — SODIUM CHLORIDE 0.9 % IV SOLN: INTRAVENOUS | Status: DC

## 2022-11-07 MED ORDER — SODIUM CHLORIDE 0.9 % IV SOLN
INTRAVENOUS | Status: DC | PRN
Start: 2022-11-07 — End: 2022-11-07

## 2022-11-07 MED ORDER — CHLORHEXIDINE GLUCONATE 0.12 % MT SOLN
15.0000 mL | Freq: Once | OROMUCOSAL | Status: AC
  Administered 2022-11-07: 15 mL via OROMUCOSAL

## 2022-11-07 MED ORDER — ACETAMINOPHEN 10 MG/ML IV SOLN
INTRAVENOUS | Status: DC | PRN
  Administered 2022-11-07: 1000 mg via INTRAVENOUS

## 2022-11-07 SURGICAL SUPPLY — 51 items
ADH SKN CLS APL DERMABOND .7 (GAUZE/BANDAGES/DRESSINGS) ×1
BAG PRESSURE INF REUSE 1000 (BAG) IMPLANT
CANNULA REDUCER 12-8 DVNC XI (CANNULA) ×1 IMPLANT
CATH REDDICK CHOLANGI 4FR 50CM (CATHETERS) IMPLANT
CAUTERY HOOK MNPLR 1.6 DVNC XI (INSTRUMENTS) ×1 IMPLANT
CLIP LIGATING HEM O LOK PURPLE (MISCELLANEOUS) IMPLANT
CLIP LIGATING HEMO O LOK GREEN (MISCELLANEOUS) ×1 IMPLANT
DERMABOND ADVANCED .7 DNX12 (GAUZE/BANDAGES/DRESSINGS) ×1 IMPLANT
DRAPE ARM DVNC X/XI (DISPOSABLE) ×4 IMPLANT
DRAPE C-ARM XRAY 36X54 (DRAPES) IMPLANT
DRAPE COLUMN DVNC XI (DISPOSABLE) ×1 IMPLANT
ELECT REM PT RETURN 9FT ADLT (ELECTROSURGICAL) ×1
ELECTRODE REM PT RTRN 9FT ADLT (ELECTROSURGICAL) ×1 IMPLANT
FORCEPS BPLR 8 MD DVNC XI (FORCEP) ×1 IMPLANT
FORCEPS BPLR R/ABLATION 8 DVNC (INSTRUMENTS) ×1 IMPLANT
FORCEPS PROGRASP DVNC XI (FORCEP) ×1 IMPLANT
GLOVE BIO SURGEON STRL SZ 6.5 (GLOVE) ×2 IMPLANT
GLOVE BIOGEL PI IND STRL 6.5 (GLOVE) ×2 IMPLANT
GOWN STRL REUS W/ TWL LRG LVL3 (GOWN DISPOSABLE) ×3 IMPLANT
GOWN STRL REUS W/TWL LRG LVL3 (GOWN DISPOSABLE) ×3
GRASPER SUT TROCAR 14GX15 (MISCELLANEOUS) ×1 IMPLANT
IRRIGATOR SUCT 8 DISP DVNC XI (IRRIGATION / IRRIGATOR) IMPLANT
IV CATH ANGIO 12GX3 LT BLUE (NEEDLE) IMPLANT
IV NS 1000ML (IV SOLUTION)
IV NS 1000ML BAXH (IV SOLUTION) IMPLANT
KIT PINK PAD W/HEAD ARE REST (MISCELLANEOUS) ×1 IMPLANT
KIT PINK PAD W/HEAD ARM REST (MISCELLANEOUS) ×1 IMPLANT
LABEL OR SOLS (LABEL) ×1 IMPLANT
MANIFOLD NEPTUNE II (INSTRUMENTS) ×1 IMPLANT
NDL HYPO 22X1.5 SAFETY MO (MISCELLANEOUS) ×1 IMPLANT
NDL INSUFFLATION 14GA 120MM (NEEDLE) ×1 IMPLANT
NEEDLE HYPO 22X1.5 SAFETY MO (MISCELLANEOUS) ×1 IMPLANT
NEEDLE INSUFFLATION 14GA 120MM (NEEDLE) ×1 IMPLANT
NS IRRIG 500ML POUR BTL (IV SOLUTION) ×1 IMPLANT
OBTURATOR OPTICAL STND 8 DVNC (TROCAR) ×1
OBTURATOR OPTICALSTD 8 DVNC (TROCAR) ×1 IMPLANT
PACK LAP CHOLECYSTECTOMY (MISCELLANEOUS) ×1 IMPLANT
SEAL UNIV 5-12 XI (MISCELLANEOUS) ×4 IMPLANT
SET TUBE SMOKE EVAC HIGH FLOW (TUBING) ×1 IMPLANT
SOL ELECTROSURG ANTI STICK (MISCELLANEOUS) ×1
SOLUTION ELECTROSURG ANTI STCK (MISCELLANEOUS) ×1 IMPLANT
SPIKE FLUID TRANSFER (MISCELLANEOUS) ×2 IMPLANT
SPONGE T-LAP 4X18 ~~LOC~~+RFID (SPONGE) IMPLANT
SUT MNCRL 4-0 (SUTURE) ×1
SUT MNCRL 4-0 27XMFL (SUTURE) ×1
SUT VICRYL 0 UR6 27IN ABS (SUTURE) ×1 IMPLANT
SUTURE MNCRL 4-0 27XMF (SUTURE) ×1 IMPLANT
SYS BAG RETRIEVAL 10MM (BASKET) ×1
SYSTEM BAG RETRIEVAL 10MM (BASKET) ×1 IMPLANT
TRAP FLUID SMOKE EVACUATOR (MISCELLANEOUS) ×1 IMPLANT
WATER STERILE IRR 500ML POUR (IV SOLUTION) ×1 IMPLANT

## 2022-11-07 NOTE — Anesthesia Procedure Notes (Signed)
Procedure Name: Intubation Date/Time: 11/07/2022 12:49 PM  Performed by: Jeannene Patella, CRNAPre-anesthesia Checklist: Patient identified, Timeout performed, Suction available, Emergency Drugs available and Patient being monitored Patient Re-evaluated:Patient Re-evaluated prior to induction Oxygen Delivery Method: Circle system utilized Preoxygenation: Pre-oxygenation with 100% oxygen Induction Type: IV induction Ventilation: Mask ventilation without difficulty Laryngoscope Size: McGraph and 4 Grade View: Grade II Tube type: Oral Tube size: 6.5 mm Number of attempts: 1 Airway Equipment and Method: Stylet and Video-laryngoscopy Placement Confirmation: ETT inserted through vocal cords under direct vision, positive ETCO2 and breath sounds checked- equal and bilateral Secured at: 20 cm Tube secured with: Tape Dental Injury: Teeth and Oropharynx as per pre-operative assessment

## 2022-11-07 NOTE — H&P (Signed)
SURGICAL CONSULTATION NOTE   HISTORY OF PRESENT ILLNESS (HPI):  37 y.o. female presented to [facility] ED for evaluation of . Patient reports having epigastric pain since last night.  She endorses that she started with epigastric pain last night about 7 PM.  The pain radiates up to her chest and to the right subcostal area and to her back.  Patient cannot identify any alleviating factors.  Aggravating factor is eating.  At the ED she was found with normal white blood cell count, no significant electrolyte disturbance, normal bilirubin and liver function test.  There is tenderness on palpation in the right upper quadrant.  Abdominal ultrasound shows gallstones with gallbladder wall thickening and pericholecystic fluid consistent with acute cholecystitis.  I personally evaluated the images.  Surgery is consulted by Dr. Katrinka Blazing in this context for evaluation and management of acute cholecystitis.  PAST MEDICAL HISTORY (PMH):  Past Medical History:  Diagnosis Date   Depression    Genital herpes    Thyroid disease    UTI (urinary tract infection)      PAST SURGICAL HISTORY (PSH):  Past Surgical History:  Procedure Laterality Date   INTRAUTERINE DEVICE (IUD) INSERTION  2018   INTRAUTERINE DEVICE (IUD) INSERTION N/A 04/26/2016   Procedure: INTRAUTERINE DEVICE (IUD) INSERTION;  Surgeon: Conard Novak, MD;  Location: [facility] ORS;  Service: Gynecology;  Laterality: N/A;  lot tu01sce exp 09/012020 mirena   IUD REMOVAL N/A 04/26/2016   Procedure: INTRAUTERINE DEVICE (IUD) REMOVAL;  Surgeon: Conard Novak, MD;  Location: [facility] ORS;  Service: Gynecology;  Laterality: N/A;   LAPAROSCOPIC BILATERAL SALPINGECTOMY Bilateral 03/09/2021   Procedure: LAPAROSCOPY WITH TUBAL PARTIAL SALPINGECTOMY;  Surgeon: Nadara Mustard, MD;  Location: [facility] ORS;  Service: Gynecology;  Laterality: Bilateral;   LAPAROSCOPY N/A 04/26/2016   Procedure: LAPAROSCOPY OPERATIVE;  Surgeon: Conard Novak, MD;  Location: [facility] ORS;   Service: Gynecology;  Laterality: N/A;   nexplanon  2020     MEDICATIONS:  Prior to Admission medications   Medication Sig Start Date End Date Taking? Authorizing Provider  oseltamivir (TAMIFLU) 75 MG capsule Take 1 capsule (75 mg total) by mouth every 12 (twelve) hours. 01/12/22   Barbette Merino, NP  promethazine-dextromethorphan (PROMETHAZINE-DM) 6.25-15 MG/5ML syrup Take 5 mLs by mouth 4 (four) times daily as needed for cough. 01/12/22   Barbette Merino, NP     ALLERGIES:  No Known Allergies   SOCIAL HISTORY:  Social History   Socioeconomic History   Marital status: Married    Spouse name: Indi Willhite   Number of children: 2   Years of education: Not on file   Highest education level: Not on file  Occupational History   Occupation: Administrator, sports    Comment: Unemployed since [employer]  Tobacco Use   Smoking status: Never   Smokeless tobacco: Never  Vaping Use   Vaping status: Never Used  Substance and Sexual Activity   Alcohol use: No   Drug use: No   Sexual activity: Yes    Birth control/protection: None  Other Topics Concern   Not on file  Social History Narrative   Out of work due to [employer]   Social Determinants of Health   Financial Resource Strain: Low Risk  (09/19/2017)   Overall Financial Resource Strain (CARDIA)    Difficulty of Paying Living Expenses: Not hard at all  Food Insecurity: No Food Insecurity (09/19/2017)   Hunger Vital Sign    Worried About Programme researcher, broadcasting/film/video in  the Last Year: Never true    Ran Out of Food in the Last Year: Never true  Transportation Needs: No Transportation Needs (09/19/2017)   PRAPARE - Administrator, Civil Service (Medical): No    Lack of Transportation (Non-Medical): No  Physical Activity: Inactive (09/19/2017)   Exercise Vital Sign    Days of Exercise per Week: 0 days    Minutes of Exercise per Session: 0 min  Stress: Stress Concern Present (09/19/2017)   [employer] of Occupational Health -  Occupational Stress Questionnaire    Feeling of Stress : Very much  Social Connections: Somewhat Isolated (09/19/2017)   Social Connection and Isolation Panel [NHANES]    Frequency of Communication with Friends and Family: More than three times a week    Frequency of Social Gatherings with Friends and Family: More than three times a week    Attends Religious Services: Never    Database administrator or Organizations: No    Attends Engineer, structural: Not asked    Marital Status: Married  Catering manager Violence: Not At Risk (06/27/2018)   Humiliation, Afraid, Rape, and Kick questionnaire    Fear of Current or Ex-Partner: No    Emotionally Abused: No    Physically Abused: No    Sexually Abused: No     FAMILY HISTORY:  Family History  Problem Relation Age of Onset   Diabetes Maternal Grandfather      REVIEW OF SYSTEMS:  Constitutional: denies weight loss, fever, chills, or sweats  Eyes: denies any other vision changes, history of eye injury  ENT: denies sore throat, hearing problems  Respiratory: denies shortness of breath, wheezing  Cardiovascular: denies chest pain, palpitations  Gastrointestinal: positive abdominal pain, nausea and vomitnig Genitourinary: denies burning with urination or urinary frequency Musculoskeletal: denies any other joint pains or cramps  Skin: denies any other rashes or skin discolorations  Neurological: denies any other headache, dizziness, weakness  Psychiatric: denies any other depression, anxiety   All other review of systems were negative   VITAL SIGNS:  Temp:  [98.6 F (37 C)] 98.6 F (37 C) (10/08 2359) Pulse Rate:  [77-96] 81 (10/09 0330) Resp:  [15-23] 19 (10/09 0330) BP: (94-131)/(64-83) 94/64 (10/09 0330) SpO2:  [98 %-100 %] 100 % (10/09 0330) Weight:  [81.6 kg] 81.6 kg (10/08 2356)     Height: 5\' 5"  (165.1 cm) Weight: 81.6 kg BMI (Calculated): 29.95   INTAKE/OUTPUT:  This shift: No intake/output data recorded.  Last 2  shifts: @IOLAST2SHIFTS @   PHYSICAL EXAM:  Constitutional:  -- Normal body habitus  -- Awake, alert, and oriented x3  Eyes:  -- Pupils equally round and reactive to light  -- No scleral icterus  Ear, nose, and throat:  -- No jugular venous distension  Pulmonary:  -- No crackles  -- Equal breath sounds bilaterally -- Breathing non-labored at rest Cardiovascular:  -- S1, S2 present  -- No pericardial rubs Gastrointestinal:  -- Abdomen soft, tender to palpation in the right upper quadrant, non-distended, no guarding or rebound tenderness -- No abdominal masses appreciated, pulsatile or otherwise  Musculoskeletal and Integumentary:  -- Wounds: None appreciated -- Extremities: B/L UE and LE FROM, hands and feet warm, no edema  Neurologic:  -- Motor function: intact and symmetric -- Sensation: intact and symmetric   Labs:     Latest Ref Rng & Units 11/07/2022   12:00 AM 09/10/2022    9:27 PM 08/11/2021    7:30 PM  CBC  WBC 4.0 - 10.5 K/uL 9.9  8.8  14.3   Hemoglobin 12.0 - 15.0 g/dL 47.8  29.5  62.1   Hematocrit 36.0 - 46.0 % 37.4  39.9  39.6   Platelets 150 - 400 K/uL 262  251  353       Latest Ref Rng & Units 11/07/2022   12:00 AM 09/10/2022    9:27 PM 08/11/2021    7:30 PM  CMP  Glucose 70 - 99 mg/dL 308  657  846   BUN 6 - 20 mg/dL 10  9  <5   Creatinine 0.44 - 1.00 mg/dL 9.62  9.52  8.41   Sodium 135 - 145 mmol/L 137  133  137   Potassium 3.5 - 5.1 mmol/L 3.7  3.5  3.8   Chloride 98 - 111 mmol/L 106  104  106   CO2 22 - 32 mmol/L 23  21  23    Calcium 8.9 - 10.3 mg/dL 8.6  8.1  8.8   Total Protein 6.5 - 8.1 g/dL 7.5  7.6  7.3   Total Bilirubin 0.3 - 1.2 mg/dL 0.4  1.1  0.8   Alkaline Phos 38 - 126 U/L 58  60  78   AST 15 - 41 U/L 15  15  14    ALT 0 - 44 U/L 16  15  14      Imaging studies:  EXAM: ULTRASOUND ABDOMEN LIMITED RIGHT UPPER QUADRANT   COMPARISON:  Prior CT from 08/11/2021.   FINDINGS: Gallbladder:   Multiple small stones seen within the  gallbladder lumen, largest of which measures 10 mm. Small amount of gallbladder sludge. Gallbladder wall is mildly thickened up to 7 mm. Trace free pericholecystic fluid. A positive sonographic Murphy sign was elicited on exam.   Common bile duct:   Diameter: 3.1 mm   Liver:   No focal lesion identified. Within normal limits in parenchymal echogenicity. Portal vein is patent on color Doppler imaging with normal direction of blood flow towards the liver.   Other: None.   IMPRESSION: 1. Cholelithiasis with mild gallbladder wall thickening and positive sonographic Murphy sign. Clinical correlation for possible acute cholecystitis recommended. 2. No biliary dilatation.     Electronically Signed   By: Rise Mu M.D.   On: 11/07/2022 03:07    Assessment/Plan:  37 y.o. female with acute cholecystitis, complicated by pertinent comorbidities including pure hypercholesterolemia.   Patient with history, physical exam and images consistent with acute cholecystitis. Patient oriented about diagnosis and surgical management as treatment.   Discussed the risk of surgery including post-op infxn, seroma, biloma, chronic pain, poor-delayed wound healing, retained gallstone, conversion to open procedure, post-op SBO or ileus, and need for additional procedures to address said risks.  The risks of general anesthetic including MI, CVA, sudden death or even reaction to anesthetic medications also discussed. Alternatives include continued observation.  Benefits include possible symptom relief, prevention of complications including acute cholecystitis, pancreatitis.  Gae Gallop, MD

## 2022-11-07 NOTE — ED Notes (Signed)
ED Provider at bedside. 

## 2022-11-07 NOTE — Anesthesia Preprocedure Evaluation (Signed)
Anesthesia Evaluation  Patient identified by MRN, date of birth, ID band Patient awake    Reviewed: Allergy & Precautions, NPO status , Patient's Chart, lab work & pertinent test results  Airway Mallampati: II  TM Distance: >3 FB Neck ROM: full    Dental  (+) Teeth Intact   Pulmonary neg pulmonary ROS   Pulmonary exam normal breath sounds clear to auscultation       Cardiovascular Exercise Tolerance: Good negative cardio ROS Normal cardiovascular exam Rhythm:Regular Rate:Normal     Neuro/Psych    Depression    negative neurological ROS  negative psych ROS   GI/Hepatic negative GI ROS, Neg liver ROS,,,  Endo/Other  negative endocrine ROS    Renal/GU negative Renal ROS  negative genitourinary   Musculoskeletal   Abdominal  (+) + obese  Peds negative pediatric ROS (+)  Hematology negative hematology ROS (+)   Anesthesia Other Findings Past Medical History: No date: Depression No date: Genital herpes No date: Thyroid disease No date: UTI (urinary tract infection)  Past Surgical History: 2018: INTRAUTERINE DEVICE (IUD) INSERTION 04/26/2016: INTRAUTERINE DEVICE (IUD) INSERTION; N/A     Comment:  Procedure: INTRAUTERINE DEVICE (IUD) INSERTION;                Surgeon: Conard Novak, MD;  Location: [facility];                Service: Gynecology;  Laterality: N/A;  lot tu01sce exp               09/012020 mirena 04/26/2016: IUD REMOVAL; N/A     Comment:  Procedure: INTRAUTERINE DEVICE (IUD) REMOVAL;  Surgeon:               Conard Novak, MD;  Location: [facility];  Service:               Gynecology;  Laterality: N/A; 03/09/2021: LAPAROSCOPIC BILATERAL SALPINGECTOMY; Bilateral     Comment:  Procedure: LAPAROSCOPY WITH TUBAL PARTIAL SALPINGECTOMY;              Surgeon: Nadara Mustard, MD;  Location: [facility];                Service: Gynecology;  Laterality: Bilateral; 04/26/2016: LAPAROSCOPY; N/A     Comment:   Procedure: LAPAROSCOPY OPERATIVE;  Surgeon: Conard Novak, MD;  Location: [facility] OR[facility];  Service: Gynecology;                Laterality: N/A; 2020: nexplanon  BMI    Body Mass Index: 29.95 kg/m      Reproductive/Obstetrics negative OB ROS                             Anesthesia Physical Anesthesia Plan  ASA: 2  Anesthesia Plan: General   Post-op Pain Management:    Induction: Intravenous  PONV Risk Score and Plan: 1 and Ondansetron and Dexamethasone  Airway Management Planned: Oral ETT  Additional Equipment:   Intra-op Plan:   Post-operative Plan: Extubation in OR  Informed Consent: I have reviewed the patients History and Physical, chart, labs and discussed the procedure including the risks, benefits and alternatives for the proposed anesthesia with the patient or authorized representative who has indicated his/her understanding and acceptance.     Dental Advisory Given  Plan Discussed with: CRNA and Surgeon  Anesthesia Plan  Comments:        Anesthesia Quick Evaluation

## 2022-11-07 NOTE — Op Note (Signed)
Preoperative diagnosis: Acute cholecystitis  Postoperative diagnosis: Same  Procedure: Robotic Assisted Laparoscopic Cholecystectomy.   Anesthesia: GETA   Surgeon: Dr. Hazle Quant  Wound Classification: Clean Contaminated  Indications: Patient is a 37 y.o. female developed right upper quadrant pain, nausea, vomiting and on workup was found to have cholelithiasis with a normal common duct with cholecystitis. Robotic Assisted Laparoscopic cholecystectomy was elected.  Findings: Pericholecystic edema  Critical view of safety achieved Cystic duct and artery identified, ligated and divided Adequate hemostasis   Description of procedure: The patient was placed on the operating table in the supine position. General anesthesia was induced. A time-out was completed verifying correct patient, procedure, site, positioning, and implant(s) and/or special equipment prior to beginning this procedure. An orogastric tube was placed. The abdomen was prepped and draped in the usual sterile fashion.  An incision was made in a natural skin line below the umbilicus.  The fascia was elevated and the Veress needle inserted. Proper position was confirmed by aspiration and saline meniscus test.  The abdomen was insufflated with carbon dioxide to a pressure of 15 mmHg. The patient tolerated insufflation well. A 8-mm trocar was then inserted in optiview fashion.  The laparoscope was inserted and the abdomen inspected. No injuries from initial trocar placement were noted. Additional trocars were then inserted in the following locations: an 8-mm trocar in the left lateral abdomen, and another two 8-mm trocars to the right side of the abdomen 5 cm appart. The umbilical trocar was changed to a 12 mm trocar all under direct visualization. The abdomen was inspected and no abnormalities were found. The table was placed in the reverse Trendelenburg position with the right side up. The robotic arms were docked and target  anatomy identified. Instrument inserted under direct visualization.  Filmy adhesions between the gallbladder and omentum, duodenum and transverse colon were lysed with electrocautery. The dome of the gallbladder was grasped with a prograsp and retracted over the dome of the liver. The infundibulum was also grasped with an atraumatic grasper and retracted toward the right lower quadrant. This maneuver exposed Calot's triangle. The peritoneum overlying the gallbladder infundibulum was then incised and the cystic duct and cystic artery identified and circumferentially dissected. Critical view of safety reviewed before ligating any structure. Firefly images taken to visualize biliary ducts. The cystic duct and cystic artery were then doubly clipped and divided close to the gallbladder.  The gallbladder was then dissected from its peritoneal attachments by electrocautery. Hemostasis was checked and the gallbladder and contained stones were removed using an endoscopic retrieval bag. The gallbladder was passed off the table as a specimen. There was no evidence of bleeding from the gallbladder fossa or cystic artery or leakage of the bile from the cystic duct stump. Secondary trocars were removed under direct vision. No bleeding was noted. The robotic arms were undoked. The scope was withdrawn and the umbilical trocar removed. The abdomen was allowed to collapse. The fascia of the 12mm trocar sites was closed with figure-of-eight 0 vicryl sutures. The skin was closed with subcuticular sutures of 4-0 monocryl and topical skin adhesive. The orogastric tube was removed.  The patient tolerated the procedure well and was taken to the postanesthesia care unit in stable condition.   Specimen: Gallbladder  Complications: None  EBL: 5 mL

## 2022-11-07 NOTE — ED Notes (Signed)
US at bedside

## 2022-11-07 NOTE — ED Provider Notes (Signed)
[facility] Provider Note    Event Date/Time   First MD Initiated Contact with Patient 11/07/22 0105     (approximate)   History   Chest Pain   HPI  Kari Frost is a 37 y.o. female who presents to the ED for evaluation of Chest Pain   Patient presents to the ED for evaluation of postprandial upper abdominal pain.  She reports a few episodes in the past few months, often postprandially, but was more severe tonight.  Often with associated nausea and vomiting.  Tonight, about 1.5 hours after eating dinner, and starting around 7 PM she began having epigastric burning that went up her midline chest.  Reports multiple episodes of emesis into the persistent symptoms she presents to the ED.  No syncope, falls or trauma, stool or urinary changes, fever, dyspnea or cough  Physical Exam   Triage Vital Signs: ED Triage Vitals  Encounter Vitals Group     BP 11/06/22 2359 129/83     Systolic BP Percentile --      Diastolic BP Percentile --      Pulse Rate 11/06/22 2359 96     Resp 11/06/22 2359 18     Temp 11/06/22 2359 98.6 F (37 C)     Temp Source 11/06/22 2359 Oral     SpO2 11/06/22 2359 100 %     Weight 11/06/22 2356 180 lb (81.6 kg)     Height 11/06/22 2356 5\' 5"  (1.651 m)     Head Circumference --      Peak Flow --      Pain Score 11/06/22 2356 10     Pain Loc --      Pain Education --      Exclude from Growth Chart --     Most recent vital signs: Vitals:   11/07/22 0108 11/07/22 0130  BP: 131/70 112/70  Pulse:  77  Resp: 15 (!) 23  Temp:    SpO2:  98%    General: Awake, no distress.  Well-appearing and conversational in full sentences CV:  Good peripheral perfusion.  RRR without appreciable murmur Resp:  Normal effort.  Clear Abd:  No distention.  Mild upper abdominal tenderness without localizing or peritoneal features. MSK:  No deformity noted.  Neuro:  No focal deficits appreciated. Other:     ED Results / Procedures /  Treatments   Labs (all labs ordered are listed, but only abnormal results are displayed) Labs Reviewed  COMPREHENSIVE METABOLIC PANEL - Abnormal; Notable for the following components:      Result Value   Glucose, Bld 124 (*)    Calcium 8.6 (*)    All other components within normal limits  URINALYSIS, ROUTINE W REFLEX MICROSCOPIC - Abnormal; Notable for the following components:   Color, Urine YELLOW (*)    APPearance HAZY (*)    Hgb urine dipstick MODERATE (*)    Leukocytes,Ua TRACE (*)    All other components within normal limits  CBC WITH DIFFERENTIAL/PLATELET  LIPASE, BLOOD  POC URINE PREG, ED  TROPONIN I (HIGH SENSITIVITY)    EKG Sinus rhythm with a rate of 95 bpm.  Normal axis and intervals.  T wave inversions to lead III.  No STEMI. No recent comparison  RADIOLOGY CXR interpreted by me without evidence of acute cardiopulmonary pathology.  RUQ ultrasound interpreted by me with gallstones, borderline wall thickening and pericholecystic fluid  Official radiology report(s): US ABDOMEN LIMITED RUQ (LIVER/GB)  Result Date: 11/07/2022  CLINICAL DATA:  Initial evaluation for acute right upper quadrant abdominal pain EXAM: ULTRASOUND ABDOMEN LIMITED RIGHT UPPER QUADRANT COMPARISON:  Prior CT from 08/11/2021. FINDINGS: Gallbladder: Multiple small stones seen within the gallbladder lumen, largest of which measures 10 mm. Small amount of gallbladder sludge. Gallbladder wall is mildly thickened up to 7 mm. Trace free pericholecystic fluid. A positive sonographic Murphy sign was elicited on exam. Common bile duct: Diameter: 3.1 mm Liver: No focal lesion identified. Within normal limits in parenchymal echogenicity. Portal vein is patent on color Doppler imaging with normal direction of blood flow towards the liver. Other: None. IMPRESSION: 1. Cholelithiasis with mild gallbladder wall thickening and positive sonographic Murphy sign. Clinical correlation for possible acute cholecystitis  recommended. 2. No biliary dilatation. Electronically Signed   By: Rise Mu M.D.   On: 11/07/2022 03:07   DG Chest 2 View  Result Date: 11/07/2022 CLINICAL DATA:  Chest pain. EXAM: CHEST - 2 VIEW COMPARISON:  Chest radiograph dated 09/02/2014. FINDINGS: The heart size and mediastinal contours are within normal limits. Both lungs are clear. The visualized skeletal structures are unremarkable. IMPRESSION: No active cardiopulmonary disease. Electronically Signed   By: Elgie Collard M.D.   On: 11/07/2022 00:24    PROCEDURES and INTERVENTIONS:  Procedures  Medications  sucralfate (CARAFATE) tablet 1 g (1 g Oral Not Given 11/07/22 0137)  piperacillin-tazobactam (ZOSYN) IVPB 3.375 g (has no administration in time range)  metoCLOPramide (REGLAN) injection 10 mg (has no administration in time range)  fentaNYL (SUBLIMAZE) injection 50 mcg (has no administration in time range)  ondansetron (ZOFRAN-ODT) disintegrating tablet 4 mg (4 mg Oral Given 11/07/22 0137)  alum & mag hydroxide-simeth (MAALOX/MYLANTA) 200-200-20 MG/5ML suspension 30 mL (30 mLs Oral Given 11/07/22 0137)  famotidine (PEPCID) tablet 20 mg (20 mg Oral Given 11/07/22 0137)     IMPRESSION / MDM / ASSESSMENT AND PLAN / ED COURSE  I reviewed the triage vital signs and the nursing notes.  Differential diagnosis includes, but is not limited to, biliary colic, GERD or gastritis, pancreatitis, pneumonia or aspiration, ACS, pneumothorax  {Patient presents with symptoms of an acute illness or injury that is potentially life-threatening.  Patient presents with chronic episodic postprandial upper abdominal pain with nausea and vomiting.  Looks well, but remains symptomatic and has some mild localized tenderness.  Chest pain protocols are fairly reassuring with nonischemic EKG, negative troponin and clear CXR.  Normal lipase, LFTs and WBC.  We will obtain an RUQ ultrasound to assess for gallstones as we provide.  Medications for  gastritis and GERD.  Ultrasound does return with evidence of stones as well as sonographic evidence of cholecystitis.  As below, on reassessments she does have increased tenderness than my initial examination and I do note tachycardia on the monitor that is transient.  Alongside her persistent symptoms, I am concerned for cholecystitis despite her reassuring blood work.  I consult surgery who agrees to admit.  Clinical Course as of 11/07/22 0325  Wed Nov 07, 2022  0320 Reassessed. Discussed u/s and reassessed, still TTP, actually a little more than before. Tachycardia noted on the monitor. Discussed plan of care.  [DS]  2130 I consult with general surgery, who agrees to admit [DS]    Clinical Course User Index [DS] Delton Prairie, MD     FINAL CLINICAL IMPRESSION(S) / ED DIAGNOSES   Final diagnoses:  Cholecystitis, acute     Rx / DC Orders   ED Discharge Orders     None  Note:  This document was prepared using Dragon voice recognition software and may include unintentional dictation errors.   Delton Prairie, MD 11/07/22 [phone]

## 2022-11-07 NOTE — Transfer of Care (Signed)
Immediate Anesthesia Transfer of Care Note  Patient: Kari Frost  Procedure(s) Performed: XI ROBOTIC ASSISTED LAPAROSCOPIC CHOLECYSTECTOMY INDOCYANINE GREEN FLUORESCENCE IMAGING (ICG)  Patient Location: PACU  Anesthesia Type:General  Level of Consciousness: drowsy and pateint uncooperative  Airway & Oxygen Therapy: Patient Spontanous Breathing and Patient connected to face mask oxygen  Post-op Assessment: Report given to RN and Post -op Vital signs reviewed and stable  Post vital signs: Reviewed and stable  Last Vitals:  Vitals Value Taken Time  BP 110/61 11/07/22 1347  Temp 36.1 C 11/07/22 1347  Pulse 63 11/07/22 1350  Resp 21 11/07/22 1350  SpO2 93 % 11/07/22 1350  Vitals shown include unfiled device data.  Last Pain:  Vitals:   11/07/22 1128  TempSrc: Temporal  PainSc: 0-No pain         Complications: No notable events documented.

## 2022-11-08 LAB — SURGICAL PATHOLOGY

## 2022-11-08 MED ORDER — HYDROCODONE-ACETAMINOPHEN 5-325 MG PO TABS: 1.0000 | ORAL_TABLET | ORAL | 0 refills | Status: AC | PRN

## 2022-11-08 MED ORDER — ORAL CARE MOUTH RINSE: 15.0000 mL | OROMUCOSAL | Status: DC | PRN

## 2022-11-08 NOTE — Discharge Summary (Signed)
Patient ID: Kari Frost MRN: [id number] DOB/AGE: 1985/11/13 37 y.o.  Admit date: 11/07/2022 Discharge date: 11/08/2022   Discharge Diagnoses:  Principal Problem:   Acute cholecystitis   Procedures: Robotic assisted laparoscopic cholecystectomy  Hospital Course: Patient admitted with acute cholecystitis.  She underwent robotic cholecystectomy.  She has been recovering well.  The patient is tolerating diet.  The pain is controlled.  Patient ambulating.  Incisions are dry and clean.  Physical Exam Vitals reviewed.  HENT:     Head: Normocephalic.  Cardiovascular:     Rate and Rhythm: Normal rate and regular rhythm.  Pulmonary:     Effort: Pulmonary effort is normal.     Breath sounds: Normal breath sounds.  Abdominal:     General: Bowel sounds are normal. There is no abdominal bruit.     Palpations: Abdomen is soft.     Tenderness: There is no abdominal tenderness.  Skin:    General: Skin is warm.     Capillary Refill: Capillary refill takes less than 2 seconds.  Neurological:     Mental Status: She is alert and oriented to person, place, and time.      Consults: None  Disposition: Discharge disposition: 01-Home or Self Care       Discharge Instructions     Diet - low sodium heart healthy   Complete by: As directed    Increase activity slowly   Complete by: As directed       Allergies as of 11/08/2022   No Known Allergies      Medication List     TAKE these medications    HYDROcodone-acetaminophen 5-325 MG tablet Commonly known as: Norco Take 1 tablet by mouth every 4 (four) hours as needed for up to 3 days for moderate pain.   oseltamivir 75 MG capsule Commonly known as: TAMIFLU Take 1 capsule (75 mg total) by mouth every 12 (twelve) hours.   promethazine-dextromethorphan 6.25-15 MG/5ML syrup Commonly known as: PROMETHAZINE-DM Take 5 mLs by mouth 4 (four) times daily as needed for cough.        Follow-up Information      Carolan Shiver, MD Follow up in 2 week(s).   Specialty: General Surgery Why: Follow up after cholecystectomy Contact information: [address] [place] [address] [phone]

## 2022-11-08 NOTE — TOC CM/SW Note (Signed)
Transition of Care [facility]) - Inpatient Brief Assessment   Patient Details  Name: Kari Frost MRN: [id number] Date of Birth: Mar 24, 1985  [facility] of [facility] CM/SW Contact:    Margarito Liner, LCSW Phone Number: 11/08/2022, 11:05 AM   Clinical Narrative: Patient has orders to discharge home today. Patient has no insurance or PCP. She left before resources could be provided. No further concerns. CSW signing off.  Transition of Care Asessment: Insurance and Status: Selfpay Patient has primary care physician: No Home environment has been reviewed: Single family home Prior level of function:: Not documented Prior/Current Home Services: No current home services Social Determinants of Health Reivew: SDOH reviewed no interventions necessary Readmission risk has been reviewed: Yes Transition of care needs: no transition of care needs at this time

## 2022-11-08 NOTE — Progress Notes (Signed)
Discharge instructions were reviewed with patient and family at the bedside. IV x2 were removed. Questions were encourage and answered. Belongings were collected by patient.

## 2022-11-08 NOTE — Discharge Instructions (Signed)

## 2022-11-08 NOTE — Plan of Care (Signed)

## 2022-11-09 NOTE — Anesthesia Postprocedure Evaluation (Signed)
Anesthesia Post Note  Patient: Layan Zalenski  Procedure(s) Performed: XI ROBOTIC ASSISTED LAPAROSCOPIC CHOLECYSTECTOMY INDOCYANINE GREEN FLUORESCENCE IMAGING (ICG)  Patient location during evaluation: PACU Anesthesia Type: General Level of consciousness: awake Pain management: pain level controlled Vital Signs Assessment: post-procedure vital signs reviewed and stable Respiratory status: spontaneous breathing Cardiovascular status: blood pressure returned to baseline Anesthetic complications: no   No notable events documented.   Last Vitals:  Vitals:   11/08/22 0410 11/08/22 0839  BP: (!) 94/57 102/68  Pulse: 65 83  Resp: 18 17  Temp: 37 C 36.7 C  SpO2: 97% 100%    Last Pain:  Vitals:   11/08/22 0839  TempSrc: Oral  PainSc:                  VAN STAVEREN,Laisa Larrick

## 2023-03-05 NOTE — Progress Notes (Deleted)
 There were no vitals taken for this visit.   Subjective:    Patient ID: Kari Frost, female    DOB: 1985-10-30, 38 y.o.   MRN: [id number]  HPI: Kari Frost is a 38 y.o. female  No chief complaint on file.   Discussed the use of AI scribe software for clinical note transcription with the patient, who gave verbal consent to proceed.  History of Present Illness           06/27/2018    9:43 AM 10/14/2017    8:20 AM 09/19/2017   10:23 AM  Depression screen PHQ 2/9  Decreased Interest 2 0 2  Down, Depressed, Hopeless 2 1 2   PHQ - 2 Score 4 1 4   Altered sleeping 3 3 3   Tired, decreased energy 2 0 3  Change in appetite 3 3 3   Feeling bad or failure about yourself  2 0 2  Trouble concentrating 2 1 2   Moving slowly or fidgety/restless 2 0 2  Suicidal thoughts 0 0 1  PHQ-9 Score 18 8 20   Difficult doing work/chores Very difficult Not difficult at all Extremely dIfficult    Relevant past medical, surgical, family and social history reviewed and updated as indicated. Interim medical history since our last visit reviewed. Allergies and medications reviewed and updated.  Review of Systems  Per HPI unless specifically indicated above     Objective:    There were no vitals taken for this visit.  {Vitals History (Optional):23777} Wt Readings from Last 3 Encounters:  11/07/22 180 lb (81.6 kg)  09/10/22 175 lb (79.4 kg)  01/12/22 170 lb (77.1 kg)    Physical Exam  Results for orders placed or performed during the hospital encounter of 11/07/22  CBC with Differential   Collection Time: 11/07/22 12:00 AM  Result Value Ref Range   WBC 9.9 4.0 - 10.5 K/uL   RBC 4.24 3.87 - 5.11 MIL/uL   Hemoglobin 12.5 12.0 - 15.0 g/dL   HCT 62.5 63.9 - 53.9 %   MCV 88.2 80.0 - 100.0 fL   MCH 29.5 26.0 - 34.0 pg   MCHC 33.4 30.0 - 36.0 g/dL   RDW 86.8 88.4 - 84.4 %   Platelets 262 150 - 400 K/uL   nRBC 0.0 0.0 - 0.2 %   Neutrophils Relative % 60 %   Neutro Abs 5.9 1.7 -  7.7 K/uL   Lymphocytes Relative 33 %   Lymphs Abs 3.2 0.7 - 4.0 K/uL   Monocytes Relative 5 %   Monocytes Absolute 0.5 0.1 - 1.0 K/uL   Eosinophils Relative 2 %   Eosinophils Absolute 0.2 0.0 - 0.5 K/uL   Basophils Relative 0 %   Basophils Absolute 0.0 0.0 - 0.1 K/uL   Immature Granulocytes 0 %   Abs Immature Granulocytes 0.02 0.00 - 0.07 K/uL  Comprehensive metabolic panel   Collection Time: 11/07/22 12:00 AM  Result Value Ref Range   Sodium 137 135 - 145 mmol/L   Potassium 3.7 3.5 - 5.1 mmol/L   Chloride 106 98 - 111 mmol/L   CO2 23 22 - 32 mmol/L   Glucose, Bld 124 (H) 70 - 99 mg/dL   BUN 10 6 - 20 mg/dL   Creatinine, Ser 9.32 0.44 - 1.00 mg/dL   Calcium 8.6 (L) 8.9 - 10.3 mg/dL   Total Protein 7.5 6.5 - 8.1 g/dL   Albumin 4.2 3.5 - 5.0 g/dL   AST 15 15 - 41 U/L  ALT 16 0 - 44 U/L   Alkaline Phosphatase 58 38 - 126 U/L   Total Bilirubin 0.4 0.3 - 1.2 mg/dL   GFR, Estimated >39 >39 mL/min   Anion gap 8 5 - 15  Lipase, blood   Collection Time: 11/07/22 12:00 AM  Result Value Ref Range   Lipase 34 11 - 51 U/L  Urinalysis, Routine w reflex microscopic -Urine, Clean Catch   Collection Time: 11/07/22 12:00 AM  Result Value Ref Range   Color, Urine YELLOW (A) YELLOW   APPearance HAZY (A) CLEAR   Specific Gravity, Urine 1.029 1.005 - 1.030   pH 5.0 5.0 - 8.0   Glucose, UA NEGATIVE NEGATIVE mg/dL   Hgb urine dipstick MODERATE (A) NEGATIVE   Bilirubin Urine NEGATIVE NEGATIVE   Ketones, ur NEGATIVE NEGATIVE mg/dL   Protein, ur NEGATIVE NEGATIVE mg/dL   Nitrite NEGATIVE NEGATIVE   Leukocytes,Ua TRACE (A) NEGATIVE   RBC / HPF 0-5 0 - 5 RBC/hpf   WBC, UA 6-10 0 - 5 WBC/hpf   Bacteria, UA NONE SEEN NONE SEEN   Squamous Epithelial / HPF 11-20 0 - 5 /HPF   Mucus PRESENT   Surgical pathology   Collection Time: 11/07/22 12:00 AM  Result Value Ref Range   SURGICAL PATHOLOGY      SURGICAL PATHOLOGY [facility] [address] [place], [place]  [address] Telephone [phone] or [phone] Fax [phone]  REPORT OF SURGICAL PATHOLOGY   Accession #: [id number] Patient Name: Kari Frost, Kari Frost Visit # : [id number]  MRN: [id number] Physician: Rodolph Romano DOB/Age [birth year] (Age: 42) Gender: F Collected Date: 11/07/2022 Received Date: 11/07/2022  FINAL DIAGNOSIS       1. Gallbladder,  :       -  CHRONIC CHOLECYSTITIS WITH CHOLELITHIASIS AND CHOLESTEROLOSIS       DATE SIGNED OUT: 11/08/2022 ELECTRONIC SIGNATURE : Kin M.D., Annah SQUIBB., Pathologist, Electronic Signature  MICROSCOPIC DESCRIPTION  CASE COMMENTS STAINS USED IN DIAGNOSIS: H&E    CLINICAL HISTORY  SPECIMEN(S) OBTAINED 1. Gallbladder,  SPECIMEN COMMENTS: SPECIMEN CLINICAL INFORMATION: 1. Acute cholecystitis    Gross Description 1. Size/?Intact: 9. 4 x 5.5 cm; intact      Serosal surface: Pink-purple, smooth, and  glistening with a scant amount of      attached adipose tissue and a mildly roughened hepatic bed.      Mucosa/Wall: Pink-tan mucosa with moderate, diffuse yellow speckling; 0.2 cm      average wall thickness. Discrete lesions are not identified.      Contents:  A significant amount of yellow-tan, tenacious bile is distending the      lumen; 3.7 x 2.0 x 0.7 cm aggregate of multiple bright yellow, mildly bosselated      stones ranging (0.6-0.9 cm in greatest dimension); 0.8 x 0.6 x 0.2 cm additional      aggregate of multiple yellow stone fragments (0.1-0.3 cm in greatest dimension).      Cystic duct: 0.5 cm diameter, patent.      Block Summary: Representative sections including the cystic duct margin (en      face) are submitted in 1 block (1A).      AMG 11/07/2022        Report signed out from the following location(s) [facility] [address] [place], [place] [address] CLIA #: [id number]  [facility] [address] [place], [place] [address] CLIA #: [id number]    Troponin I (  High Sensitivity)   Collection Time: 11/07/22 12:00 AM  Result Value Ref Range   Troponin I (High Sensitivity) <2 <18 ng/L  POC Urine Pregnancy, ED   Collection Time: 11/07/22 12:07 AM  Result Value Ref Range   Preg Test, Ur NEGATIVE NEGATIVE  HIV Antibody (routine testing w rflx)   Collection Time: 11/07/22  4:39 AM  Result Value Ref Range   HIV Screen 4th Generation wRfx Non Reactive Non Reactive   {Labs (Optional):23779}    Assessment & Plan:   Problem List Items Addressed This Visit   None    Assessment and Plan             Follow up plan: No follow-ups on file.

## 2023-03-06 ENCOUNTER — Ambulatory Visit: Payer: Medicaid Other | Admitting: Nurse Practitioner

## 2023-03-18 ENCOUNTER — Emergency Department
Admission: EM | Admit: 2023-03-18 | Discharge: 2023-03-18 | Discharge status: Against Medical Advice | Payer: Self-pay | Attending: Emergency Medicine | Admitting: Emergency Medicine

## 2023-03-18 ENCOUNTER — Other Ambulatory Visit: Payer: Self-pay

## 2023-03-18 DIAGNOSIS — R112 Nausea with vomiting, unspecified: Secondary | ICD-10-CM | POA: Insufficient documentation

## 2023-03-18 DIAGNOSIS — Z5321 Procedure and treatment not carried out due to patient leaving prior to being seen by health care provider: Secondary | ICD-10-CM | POA: Insufficient documentation

## 2023-03-18 DIAGNOSIS — R197 Diarrhea, unspecified: Secondary | ICD-10-CM | POA: Insufficient documentation

## 2023-03-18 LAB — COMPREHENSIVE METABOLIC PANEL
ALT: 25 U/L (ref 0–44)
AST: 23 U/L (ref 15–41)
Albumin: 4.6 g/dL (ref 3.5–5.0)
Alkaline Phosphatase: 64 U/L (ref 38–126)
Anion gap: 12 (ref 5–15)
BUN: 14 mg/dL (ref 6–20)
CO2: 19 mmol/L — ABNORMAL LOW (ref 22–32)
Calcium: 8.8 mg/dL — ABNORMAL LOW (ref 8.9–10.3)
Chloride: 108 mmol/L (ref 98–111)
Creatinine, Ser: 0.64 mg/dL (ref 0.44–1.00)
GFR, Estimated: >60 mL/min (ref >60)
Glucose, Bld: 144 mg/dL — ABNORMAL HIGH (ref 70–99)
Potassium: 3.7 mmol/L (ref 3.5–5.1)
Sodium: 139 mmol/L (ref 135–145)
Total Bilirubin: 1 mg/dL (ref 0.0–1.2)
Total Protein: 7.9 g/dL (ref 6.5–8.1)

## 2023-03-18 LAB — RESP PANEL BY RT-PCR (RSV, FLU A&B, COVID)  RVPGX2
Influenza A by PCR: NEGATIVE
Influenza B by PCR: NEGATIVE
Resp Syncytial Virus by PCR: NEGATIVE
SARS Coronavirus 2 by RT PCR: NEGATIVE

## 2023-03-18 LAB — CBC
HCT: 41.1 % (ref 36.0–46.0)
Hemoglobin: 13.7 g/dL (ref 12.0–15.0)
MCH: 29.8 pg (ref 26.0–34.0)
MCHC: 33.3 g/dL (ref 30.0–36.0)
MCV: 89.5 fL (ref 80.0–100.0)
Platelets: 263 10*3/uL (ref 150–400)
RBC: 4.59 MIL/uL (ref 3.87–5.11)
RDW: 12.8 % (ref 11.5–15.5)
WBC: 13.3 10*3/uL — ABNORMAL HIGH (ref 4.0–10.5)
nRBC: 0 % (ref 0.0–0.2)

## 2023-03-18 LAB — LIPASE, BLOOD: Lipase: 24 U/L (ref 11–51)

## 2023-03-18 LAB — HCG, QUANTITATIVE, PREGNANCY: hCG, Beta Chain, Quant, S: <1 m[IU]/mL (ref <5)

## 2023-03-18 MED ORDER — ONDANSETRON 4 MG PO TBDP
4.0000 mg | ORAL_TABLET | Freq: Once | ORAL | Status: AC
  Administered 2023-03-18: 4 mg via ORAL
  Filled 2023-03-18: qty 1

## 2023-03-18 NOTE — ED Triage Notes (Signed)
Pt to ed from home via POV for N/V/D since yesterday. Pt son has thee same symptoms. Pt is caox4, in no acute distress and ambulatory in triage.

## 2023-04-29 ENCOUNTER — Emergency Department
Admission: EM | Admit: 2023-04-29 | Discharge: 2023-04-29 | Discharge status: Against Medical Advice | Source: Home / Self Care
